# Patient Record
Sex: Male | Born: 1990 | Race: White | Hispanic: No | Marital: Married | Smoking: Current some day smoker
Health system: Southern US, Community
[De-identification: ages and names within clinical notes are randomized; demographics above are authoritative.]

## PROBLEM LIST (undated history)

## (undated) ENCOUNTER — Emergency Department (HOSPITAL_COMMUNITY): Admission: EM | Payer: Medicaid Other | Source: Home / Self Care

## (undated) DIAGNOSIS — F909 Attention-deficit hyperactivity disorder, unspecified type: Secondary | ICD-10-CM

## (undated) DIAGNOSIS — K869 Disease of pancreas, unspecified: Secondary | ICD-10-CM

## (undated) DIAGNOSIS — K859 Acute pancreatitis without necrosis or infection, unspecified: Secondary | ICD-10-CM

## (undated) DIAGNOSIS — K219 Gastro-esophageal reflux disease without esophagitis: Secondary | ICD-10-CM

## (undated) DIAGNOSIS — I10 Essential (primary) hypertension: Secondary | ICD-10-CM

## (undated) HISTORY — DX: Gastro-esophageal reflux disease without esophagitis: K21.9

## (undated) HISTORY — PX: ANTERIOR CRUCIATE LIGAMENT REPAIR: SHX115

## (undated) HISTORY — DX: Disease of pancreas, unspecified: K86.9

## (undated) HISTORY — DX: Attention-deficit hyperactivity disorder, unspecified type: F90.9

## (undated) HISTORY — PX: HERNIA REPAIR: SHX51

---
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
## 2009-01-09 ENCOUNTER — Emergency Department (HOSPITAL_COMMUNITY): Admission: EM | Admit: 2009-01-09 | Discharge: 2009-01-09 | Payer: Self-pay | Admitting: Emergency Medicine

## 2009-03-11 ENCOUNTER — Emergency Department (HOSPITAL_COMMUNITY): Admission: EM | Admit: 2009-03-11 | Discharge: 2009-03-11 | Payer: Self-pay | Admitting: Emergency Medicine

## 2009-03-16 ENCOUNTER — Ambulatory Visit (HOSPITAL_COMMUNITY): Payer: Self-pay | Admitting: Psychiatry

## 2009-05-11 ENCOUNTER — Ambulatory Visit (HOSPITAL_COMMUNITY): Payer: Self-pay | Admitting: Psychiatry

## 2009-07-06 ENCOUNTER — Ambulatory Visit (HOSPITAL_COMMUNITY): Payer: Self-pay | Admitting: Psychiatry

## 2009-09-07 ENCOUNTER — Ambulatory Visit (HOSPITAL_COMMUNITY): Payer: Self-pay | Admitting: Psychiatry

## 2009-10-10 ENCOUNTER — Emergency Department (HOSPITAL_COMMUNITY): Admission: EM | Admit: 2009-10-10 | Discharge: 2009-10-10 | Payer: Self-pay | Admitting: Emergency Medicine

## 2009-10-11 ENCOUNTER — Emergency Department (HOSPITAL_COMMUNITY): Admission: EM | Admit: 2009-10-11 | Discharge: 2009-10-11 | Payer: Self-pay | Admitting: Emergency Medicine

## 2009-11-30 ENCOUNTER — Ambulatory Visit (HOSPITAL_COMMUNITY): Payer: Self-pay | Admitting: Psychiatry

## 2010-02-22 ENCOUNTER — Ambulatory Visit (HOSPITAL_COMMUNITY): Payer: Self-pay | Admitting: Psychiatry

## 2010-05-31 ENCOUNTER — Ambulatory Visit (HOSPITAL_COMMUNITY): Payer: Self-pay | Admitting: Psychiatry

## 2010-07-03 ENCOUNTER — Emergency Department (HOSPITAL_COMMUNITY): Admission: EM | Admit: 2010-07-03 | Discharge: 2010-07-03 | Payer: Self-pay | Admitting: Emergency Medicine

## 2010-08-30 ENCOUNTER — Ambulatory Visit (HOSPITAL_COMMUNITY): Payer: Self-pay | Admitting: Psychiatry

## 2010-09-12 ENCOUNTER — Emergency Department (HOSPITAL_COMMUNITY): Admission: EM | Admit: 2010-09-12 | Discharge: 2010-09-12 | Payer: Self-pay | Admitting: Emergency Medicine

## 2010-11-29 ENCOUNTER — Encounter (INDEPENDENT_AMBULATORY_CARE_PROVIDER_SITE_OTHER): Payer: Medicaid Other | Admitting: Psychiatry

## 2010-11-29 DIAGNOSIS — F909 Attention-deficit hyperactivity disorder, unspecified type: Secondary | ICD-10-CM

## 2010-11-29 DIAGNOSIS — F6381 Intermittent explosive disorder: Secondary | ICD-10-CM

## 2011-01-15 LAB — DIFFERENTIAL
Basophils Absolute: 0 10*3/uL (ref 0.0–0.1)
Eosinophils Absolute: 0 10*3/uL (ref 0.0–0.7)
Eosinophils Relative: 0 % (ref 0–5)
Lymphs Abs: 1.3 10*3/uL (ref 0.7–4.0)
Monocytes Absolute: 1.1 10*3/uL — ABNORMAL HIGH (ref 0.1–1.0)

## 2011-01-15 LAB — URINALYSIS, ROUTINE W REFLEX MICROSCOPIC
Bilirubin Urine: NEGATIVE
Glucose, UA: NEGATIVE mg/dL
Ketones, ur: NEGATIVE mg/dL
Leukocytes, UA: NEGATIVE
Protein, ur: NEGATIVE mg/dL

## 2011-01-15 LAB — CBC
HCT: 43.3 % (ref 39.0–52.0)
Hemoglobin: 14.6 g/dL (ref 13.0–17.0)
MCV: 91.5 fL (ref 78.0–100.0)
Platelets: 161 10*3/uL (ref 150–400)
RDW: 13 % (ref 11.5–15.5)

## 2011-01-15 LAB — BASIC METABOLIC PANEL
BUN: 7 mg/dL (ref 6–23)
Chloride: 100 mEq/L (ref 96–112)
Glucose, Bld: 121 mg/dL — ABNORMAL HIGH (ref 70–99)
Potassium: 3.7 mEq/L (ref 3.5–5.1)

## 2011-01-23 LAB — BASIC METABOLIC PANEL
CO2: 27 mEq/L (ref 19–32)
Chloride: 100 mEq/L (ref 96–112)
Creatinine, Ser: 0.95 mg/dL (ref 0.4–1.5)
GFR calc Af Amer: >60 mL/min (ref >60)
Potassium: 3.7 mEq/L (ref 3.5–5.1)
Sodium: 135 mEq/L (ref 135–145)

## 2011-01-23 LAB — DIFFERENTIAL
Basophils Relative: 0 % (ref 0–1)
Eosinophils Absolute: 0 10*3/uL (ref 0.0–0.7)
Eosinophils Relative: 0 % (ref 0–5)
Lymphs Abs: 1.5 10*3/uL (ref 0.7–4.0)
Monocytes Absolute: 1.3 10*3/uL — ABNORMAL HIGH (ref 0.1–1.0)
Monocytes Relative: 11 % (ref 3–12)

## 2011-01-23 LAB — URINALYSIS, ROUTINE W REFLEX MICROSCOPIC
Glucose, UA: NEGATIVE mg/dL
Hgb urine dipstick: NEGATIVE
Protein, ur: NEGATIVE mg/dL
pH: 6 (ref 5.0–8.0)

## 2011-01-23 LAB — CBC
HCT: 42.4 % (ref 39.0–52.0)
Hemoglobin: 15.1 g/dL (ref 13.0–17.0)
MCHC: 35.6 g/dL (ref 30.0–36.0)
MCV: 89.2 fL (ref 78.0–100.0)
RBC: 4.75 MIL/uL (ref 4.22–5.81)
WBC: 11.1 10*3/uL — ABNORMAL HIGH (ref 4.0–10.5)

## 2011-01-25 LAB — BASIC METABOLIC PANEL
BUN: 8 mg/dL (ref 6–23)
Chloride: 102 mEq/L (ref 96–112)
GFR calc non Af Amer: >60 mL/min (ref >60)
Glucose, Bld: 98 mg/dL (ref 70–99)
Potassium: 3.7 mEq/L (ref 3.5–5.1)
Sodium: 134 mEq/L — ABNORMAL LOW (ref 135–145)

## 2011-01-25 LAB — URINALYSIS, ROUTINE W REFLEX MICROSCOPIC
Bilirubin Urine: NEGATIVE
Glucose, UA: NEGATIVE mg/dL
Protein, ur: NEGATIVE mg/dL
Urobilinogen, UA: 1 mg/dL (ref 0.0–1.0)

## 2011-01-25 LAB — DIFFERENTIAL
Eosinophils Absolute: 0 10*3/uL (ref 0.0–0.7)
Eosinophils Relative: 0 % (ref 0–5)
Lymphocytes Relative: 17 % (ref 12–46)
Lymphs Abs: 1.3 10*3/uL (ref 0.7–4.0)
Monocytes Absolute: 1 10*3/uL (ref 0.1–1.0)

## 2011-01-25 LAB — URINE MICROSCOPIC-ADD ON

## 2011-01-25 LAB — CBC
HCT: 40.8 % (ref 39.0–52.0)
Hemoglobin: 14.1 g/dL (ref 13.0–17.0)
MCV: 88.5 fL (ref 78.0–100.0)
WBC: 7.5 10*3/uL (ref 4.0–10.5)

## 2011-02-28 ENCOUNTER — Encounter (HOSPITAL_COMMUNITY): Payer: Medicaid Other | Admitting: Psychiatry

## 2011-03-07 ENCOUNTER — Encounter (INDEPENDENT_AMBULATORY_CARE_PROVIDER_SITE_OTHER): Payer: Medicaid Other | Admitting: Psychiatry

## 2011-03-07 DIAGNOSIS — F6381 Intermittent explosive disorder: Secondary | ICD-10-CM

## 2011-03-07 DIAGNOSIS — F909 Attention-deficit hyperactivity disorder, unspecified type: Secondary | ICD-10-CM

## 2011-04-24 ENCOUNTER — Encounter: Payer: Self-pay | Admitting: *Deleted

## 2011-04-24 ENCOUNTER — Emergency Department (HOSPITAL_COMMUNITY)
Admission: EM | Admit: 2011-04-24 | Discharge: 2011-04-24 | Disposition: A | Discharge status: Home / Self Care | Payer: Medicaid Other | Attending: Emergency Medicine | Admitting: Emergency Medicine

## 2011-04-24 ENCOUNTER — Emergency Department (HOSPITAL_COMMUNITY): Payer: Medicaid Other

## 2011-04-24 DIAGNOSIS — S60229A Contusion of unspecified hand, initial encounter: Secondary | ICD-10-CM | POA: Insufficient documentation

## 2011-04-24 DIAGNOSIS — W1801XA Striking against sports equipment with subsequent fall, initial encounter: Secondary | ICD-10-CM | POA: Insufficient documentation

## 2011-04-24 MED ORDER — HYDROCODONE-ACETAMINOPHEN 5-325 MG PO TABS: ORAL_TABLET | ORAL | Status: AC

## 2011-04-24 MED ORDER — IBUPROFEN 800 MG PO TABS: 800.0000 mg | ORAL_TABLET | Freq: Three times a day (TID) | ORAL | Status: AC

## 2011-04-24 NOTE — ED Notes (Signed)
Pt states he was playing basketball Sat. Palm of hand hit against pole, no deformity noted, skin warm and dry, good digit movement, cap refills brisk, pt states he had some swelling in his palm but none present on assessment, waiting for x-ray results

## 2011-04-24 NOTE — ED Provider Notes (Signed)
History     Chief Complaint  Patient presents with  . Hand Pain    c/o right hand pain s/p fall 2 days ago   HPI Comments: Patient c/o pain to his right hand that began during the weekend while playing basketball and he struck his hand on the basketball goal.  He denies numbness of weakness of the hand.  Also denies other injuries  Patient is a 20 y.o. male presenting with hand pain. The history is provided by the patient.  Hand Pain This is a new problem. The current episode started in the past 7 days. The problem occurs constantly. The problem has been unchanged. Associated symptoms include arthralgias and myalgias. Pertinent negatives include no fever, joint swelling, neck pain, numbness or weakness. Exacerbated by: movement, gripping, and palpation. He has tried ice and heat for the symptoms. The treatment provided no relief.    History reviewed. No pertinent past medical history.  Past Surgical History  Procedure Date  . Hernia repair   . Replacement total knee     History reviewed. No pertinent family history.  History  Substance Use Topics  . Smoking status: Never Smoker   . Smokeless tobacco: Not on file  . Alcohol Use: No      Review of Systems  Constitutional: Negative for fever.  HENT: Negative for neck pain and neck stiffness.   Respiratory: Negative for chest tightness and shortness of breath.   Cardiovascular: Negative.   Genitourinary: Negative for dysuria and hematuria.  Musculoskeletal: Positive for myalgias and arthralgias. Negative for joint swelling.  Skin: Negative for color change and wound.  Neurological: Negative for weakness and numbness.  Hematological: Does not bruise/bleed easily.    Physical Exam  BP 103/77  Pulse 84  Temp(Src) 98.2 F (36.8 C) (Oral)  Resp 20  Ht 5\' 8"  (1.727 m)  Wt 168 lb (76.204 kg)  BMI 25.54 kg/m2  SpO2 99%  Physical Exam  Constitutional: He is oriented to person, place, and time. Vital signs are normal. He  appears well-developed and well-nourished.  Non-toxic appearance.  HENT:  Head: Normocephalic and atraumatic.  Eyes: Pupils are equal, round, and reactive to light.  Neck: Normal range of motion. Neck supple.  Cardiovascular: Normal rate, regular rhythm and normal heart sounds.   Pulmonary/Chest: Effort normal and breath sounds normal.  Musculoskeletal:       Right wrist: He exhibits tenderness. He exhibits no swelling, no crepitus and no deformity.       Arms: Lymphadenopathy:    He has no cervical adenopathy.  Neurological: He is alert and oriented to person, place, and time.  Skin: Skin is dry.  Psychiatric: He has a normal mood and affect.    ED Course  Procedures  MDM   ttp of the right palmar aspect of the right hand and thenar eminence.  No edema or erythema.  No weakness or numbness.  Radial pulse is intact, CR< sec, distal sensation intact      Eduin Friedel L. [place], [place] 04/24/11 1656

## 2011-04-24 NOTE — ED Notes (Signed)
C/o right hand pain s/p fall-states hit on metal base of basketball goal

## 2011-04-25 NOTE — ED Provider Notes (Signed)
Medical screening examination/treatment/procedure(s) were performed by non-physician practitioner and as supervising physician I was immediately available for consultation/collaboration.  Joya Gaskins, MD 04/25/11 [phone]

## 2011-05-09 ENCOUNTER — Encounter (INDEPENDENT_AMBULATORY_CARE_PROVIDER_SITE_OTHER): Payer: Medicaid Other | Admitting: Psychiatry

## 2011-05-09 DIAGNOSIS — F6381 Intermittent explosive disorder: Secondary | ICD-10-CM

## 2011-05-09 DIAGNOSIS — F909 Attention-deficit hyperactivity disorder, unspecified type: Secondary | ICD-10-CM

## 2011-05-30 ENCOUNTER — Emergency Department (HOSPITAL_COMMUNITY)
Admission: EM | Admit: 2011-05-30 | Discharge: 2011-05-30 | Disposition: A | Discharge status: Home / Self Care | Payer: Medicaid Other | Attending: Emergency Medicine | Admitting: Emergency Medicine

## 2011-05-30 ENCOUNTER — Encounter (HOSPITAL_COMMUNITY): Payer: Self-pay

## 2011-05-30 DIAGNOSIS — M545 Low back pain, unspecified: Secondary | ICD-10-CM | POA: Insufficient documentation

## 2011-05-30 DIAGNOSIS — R1013 Epigastric pain: Secondary | ICD-10-CM | POA: Insufficient documentation

## 2011-05-30 DIAGNOSIS — M549 Dorsalgia, unspecified: Secondary | ICD-10-CM

## 2011-05-30 LAB — COMPREHENSIVE METABOLIC PANEL
ALT: 35 U/L (ref 0–53)
AST: 24 U/L (ref 0–37)
Albumin: 4 g/dL (ref 3.5–5.2)
Alkaline Phosphatase: 115 U/L (ref 39–117)
BUN: 9 mg/dL (ref 6–23)
CO2: 24 mEq/L (ref 19–32)
Calcium: 9.9 mg/dL (ref 8.4–10.5)
Chloride: 98 mEq/L (ref 96–112)
Creatinine, Ser: 0.72 mg/dL (ref 0.50–1.35)
GFR calc Af Amer: >60 mL/min (ref >60)
GFR calc non Af Amer: >60 mL/min (ref >60)
Glucose, Bld: 98 mg/dL (ref 70–99)
Potassium: 4 mEq/L (ref 3.5–5.1)
Sodium: 135 mEq/L (ref 135–145)
Total Bilirubin: 0.2 mg/dL — ABNORMAL LOW (ref 0.3–1.2)
Total Protein: 7.7 g/dL (ref 6.0–8.3)

## 2011-05-30 LAB — DIFFERENTIAL
Basophils Absolute: 0 10*3/uL (ref 0.0–0.1)
Basophils Relative: 0 % (ref 0–1)
Eosinophils Absolute: 0.2 10*3/uL (ref 0.0–0.7)
Eosinophils Relative: 2 % (ref 0–5)
Monocytes Absolute: 1 10*3/uL (ref 0.1–1.0)

## 2011-05-30 LAB — CBC
HCT: 42.4 % (ref 39.0–52.0)
MCH: 31.3 pg (ref 26.0–34.0)
MCHC: 34.7 g/dL (ref 30.0–36.0)
MCV: 90.4 fL (ref 78.0–100.0)
Platelets: 215 10*3/uL (ref 150–400)
RDW: 12.9 % (ref 11.5–15.5)

## 2011-05-30 LAB — URINALYSIS, ROUTINE W REFLEX MICROSCOPIC
Glucose, UA: NEGATIVE mg/dL
Ketones, ur: NEGATIVE mg/dL
Leukocytes, UA: NEGATIVE
Nitrite: NEGATIVE
Protein, ur: NEGATIVE mg/dL
Urobilinogen, UA: 0.2 mg/dL (ref 0.0–1.0)

## 2011-05-30 LAB — LIPASE, BLOOD: Lipase: 18 U/L (ref 11–59)

## 2011-05-30 MED ORDER — OXYCODONE-ACETAMINOPHEN 5-325 MG PO TABS: 1.0000 | ORAL_TABLET | Freq: Four times a day (QID) | ORAL | Status: AC | PRN

## 2011-05-30 MED ORDER — ONDANSETRON HCL 4 MG/2ML IJ SOLN
4.0000 mg | Freq: Once | INTRAMUSCULAR | Status: AC
  Administered 2011-05-30: 4 mg via INTRAVENOUS
  Filled 2011-05-30: qty 2

## 2011-05-30 MED ORDER — CYCLOBENZAPRINE HCL 10 MG PO TABS: 10.0000 mg | ORAL_TABLET | Freq: Two times a day (BID) | ORAL | Status: AC | PRN

## 2011-05-30 MED ORDER — SODIUM CHLORIDE 0.9 % IV SOLN: INTRAVENOUS | Status: DC

## 2011-05-30 MED ORDER — HYDROMORPHONE HCL 1 MG/ML IJ SOLN
1.0000 mg | Freq: Once | INTRAMUSCULAR | Status: AC
  Administered 2011-05-30: 1 mg via INTRAVENOUS
  Filled 2011-05-30: qty 1

## 2011-05-30 MED ORDER — LANSOPRAZOLE 30 MG PO CPDR: 30.0000 mg | DELAYED_RELEASE_CAPSULE | Freq: Every day | ORAL | Status: DC

## 2011-05-30 MED ORDER — GI COCKTAIL ~~LOC~~
30.0000 mL | Freq: Once | ORAL | Status: AC
  Administered 2011-05-30: 30 mL via ORAL
  Filled 2011-05-30: qty 30

## 2011-05-30 MED ORDER — SODIUM CHLORIDE 0.9 % IV BOLUS (SEPSIS)
1000.0000 mL | Freq: Once | INTRAVENOUS | Status: AC
  Administered 2011-05-30: 1000 mL via INTRAVENOUS

## 2011-05-30 NOTE — ED Notes (Signed)
Pt c/o lower back pain x 2 days also reports pain in epigastric area.  Has been taking tums without relief.   Epigastric area sore to touch.  Denies n/v.  Denies chest pain

## 2011-05-30 NOTE — ED Provider Notes (Signed)
History     CSN: [id number] Arrival date & time: 05/30/2011  7:32 PM  Chief Complaint  Patient presents with  . Abdominal Pain  . Back Pain   HPI Pt reports several days of epigastric aching pain, radiating into chest, not improved with TUMS, no associated vomiting diarrhea or fever. He also now has pain in his lower back worse with movement.   History reviewed. No pertinent past medical history.  Past Surgical History  Procedure Date  . Hernia repair   . Replacement total knee     History reviewed. No pertinent family history.  History  Substance Use Topics  . Smoking status: Never Smoker   . Smokeless tobacco: Not on file  . Alcohol Use: No      Review of Systems All other systems reviewed and are negative except as noted in HPI.   Physical Exam  BP 135/89  Pulse 76  Temp(Src) 98.5 F (36.9 C) (Oral)  Resp 18  Ht 5\' 8"  (1.727 m)  Wt 183 lb 3.2 oz (83.099 kg)  BMI 27.86 kg/m2  SpO2 100%  Physical Exam  Nursing note and vitals reviewed. Constitutional: He is oriented to person, place, and time. He appears well-developed and well-nourished.  HENT:  Head: Normocephalic and atraumatic.  Eyes: EOM are normal. Pupils are equal, round, and reactive to light.  Neck: Normal range of motion. Neck supple.  Cardiovascular: Normal rate, normal heart sounds and intact distal pulses.   Pulmonary/Chest: Effort normal and breath sounds normal.  Abdominal: Soft. Bowel sounds are normal. He exhibits no distension. There is tenderness (epigatric tenderness). There is no rebound and no guarding.  Musculoskeletal: Normal range of motion. He exhibits no edema and no tenderness.       Tender over the L lumbar paraspinal muscles  Neurological: He is alert and oriented to person, place, and time. No cranial nerve deficit.  Skin: Skin is warm and dry. No rash noted.  Psychiatric: He has a normal mood and affect.    ED Course  Procedures  MDM Pt feeling better, labs  unremarkable. Pt asking for something for his back. His abdomen is benign, doubt any acute surgical intraabdominal process. Back pain is lumbar and appears to be muscle spasm and unrelated to abdominal pain. Advised Maalox, PPI for abdominal pain. Short course of percocet/flexeril for back pain. Advised PCP followup.       Charles B. Bernette Mayers, MD 05/30/11 2200

## 2011-06-20 ENCOUNTER — Encounter (HOSPITAL_COMMUNITY): Payer: Medicaid Other | Admitting: Psychiatry

## 2011-06-27 ENCOUNTER — Encounter (INDEPENDENT_AMBULATORY_CARE_PROVIDER_SITE_OTHER): Payer: Medicaid Other | Admitting: Psychiatry

## 2011-06-27 DIAGNOSIS — F909 Attention-deficit hyperactivity disorder, unspecified type: Secondary | ICD-10-CM

## 2011-06-27 DIAGNOSIS — F6381 Intermittent explosive disorder: Secondary | ICD-10-CM

## 2011-07-25 ENCOUNTER — Emergency Department (HOSPITAL_COMMUNITY)
Admission: EM | Admit: 2011-07-25 | Discharge: 2011-07-25 | Disposition: A | Discharge status: Home / Self Care | Payer: Medicaid Other | Attending: Emergency Medicine | Admitting: Emergency Medicine

## 2011-07-25 ENCOUNTER — Encounter (HOSPITAL_COMMUNITY): Payer: Self-pay | Admitting: *Deleted

## 2011-07-25 ENCOUNTER — Emergency Department (HOSPITAL_COMMUNITY): Payer: Medicaid Other

## 2011-07-25 DIAGNOSIS — W11XXXA Fall on and from ladder, initial encounter: Secondary | ICD-10-CM | POA: Insufficient documentation

## 2011-07-25 DIAGNOSIS — M62838 Other muscle spasm: Secondary | ICD-10-CM | POA: Insufficient documentation

## 2011-07-25 DIAGNOSIS — M25562 Pain in left knee: Secondary | ICD-10-CM

## 2011-07-25 DIAGNOSIS — W19XXXA Unspecified fall, initial encounter: Secondary | ICD-10-CM

## 2011-07-25 DIAGNOSIS — M25569 Pain in unspecified knee: Secondary | ICD-10-CM | POA: Insufficient documentation

## 2011-07-25 MED ORDER — CYCLOBENZAPRINE HCL 10 MG PO TABS
10.0000 mg | ORAL_TABLET | Freq: Once | ORAL | Status: AC
  Administered 2011-07-25: 10 mg via ORAL
  Filled 2011-07-25: qty 1

## 2011-07-25 MED ORDER — IBUPROFEN 100 MG/5ML PO SUSP
600.0000 mg | Freq: Once | ORAL | Status: DC
  Filled 2011-07-25: qty 30

## 2011-07-25 MED ORDER — CYCLOBENZAPRINE HCL 5 MG PO TABS: 5.0000 mg | ORAL_TABLET | Freq: Three times a day (TID) | ORAL | Status: AC | PRN

## 2011-07-25 MED ORDER — IBUPROFEN 800 MG PO TABS
ORAL_TABLET | ORAL | Status: AC
  Administered 2011-07-25: 800 mg
  Filled 2011-07-25: qty 1

## 2011-07-25 MED ORDER — TRAMADOL-ACETAMINOPHEN 37.5-325 MG PO TABS: ORAL_TABLET | ORAL | Status: AC

## 2011-07-25 NOTE — ED Provider Notes (Cosign Needed)
Scribed for Ward Givens, MD, the patient was seen in room APA11/APA11. This chart was scribed by [employer]. The patient's care started at 20:26  CSN: [id number] Arrival date & time: 07/25/2011  8:26 PM  Chief Complaint  Patient presents with  . Knee Pain   HPI Joseph Blair is a 20 y.o. male with a history of left ACl reconstruction who presents to the Emergency Department complaining of knee pain since 4pm today. Patient was 18ft up a ladder putting up Halloween decorations when he fell, falling on his left side. Patient denies hitting his head. Reports cramps on his right side of his body with pain in his right anterior neck, his muscles in his right chest and leg. Describes knee pain as a "burning" sensation. Pain is aggravated by weight bearing. Reports having ACL reconstruction in 2009 and has had chronic problems since.   History reviewed. No pertinent past medical history.  Past Surgical History  Procedure Date  . Hernia repair       ACL repair of left knee  History reviewed. No pertinent family history.  History  Substance Use Topics  . Smoking status: Never Smoker   . Smokeless tobacco: Not on file  . Alcohol Use: No      Review of Systems  All other systems reviewed and are negative.    Allergies  Bee venom  Home Medications   Current Outpatient Rx  Name Route Sig Dispense Refill  . AMPHETAMINE-DEXTROAMPHETAMINE 30 MG PO TABS Oral Take 30 mg by mouth every morning.     Marland Kitchen LANSOPRAZOLE 30 MG PO CPDR Oral Take 1 capsule (30 mg total) by mouth daily. 20 capsule 0    BP 112/95  Pulse 64  Temp(Src) 99 F (37.2 C) (Oral)  Resp 16  Ht 5\' 8"  (1.727 m)  Wt 183 lb (83.008 kg)  BMI 27.82 kg/m2  SpO2 98%  Physical Exam  Nursing note and vitals reviewed. Constitutional: He is oriented to person, place, and time. He appears well-developed and well-nourished. No distress.  HENT:  Head: Normocephalic and atraumatic.  Right Ear: External ear normal.    Left Ear: External ear normal.  Nose: Nose normal.  Mouth/Throat: Oropharynx is clear and moist. No oropharyngeal exudate.  Eyes: Conjunctivae are normal. Pupils are equal, round, and reactive to light. Right eye exhibits no discharge. Left eye exhibits no discharge.  Neck: Normal range of motion. Neck supple. No tracheal deviation present.       Right sternocleidomastoid muscle tenderness, nontender midline cervical spine or paraspinous muscle.   Cardiovascular: Normal rate and normal heart sounds.   No murmur heard. Pulmonary/Chest: Effort normal and breath sounds normal. No respiratory distress. He has no wheezes.       Nontender ribs to palpation  Abdominal: Soft. Bowel sounds are normal. There is no tenderness. There is no rebound.  Musculoskeletal: Normal range of motion. He exhibits no edema.       Diffuse muscle tenderness in the legs on the right. He has no effusion in his left knee and states the whole knee is tender to palpation. No abrasions, swelling or bruising.  Neurological: He is oriented to person, place, and time. No cranial nerve deficit.  Skin: Skin is warm and dry. No rash noted. He is not diaphoretic. No erythema.       No brusing or abrasions seen  Psychiatric: He has a normal mood and affect. His behavior is normal.    ED Course  Procedures  DIAGNOSTIC STUDIES: Oxygen Saturation is 98% on room air, normal by my interpretation.    Dg Knee Complete 4 Views Left  07/25/2011  *RADIOLOGY REPORT*  Clinical Data: Left knee pain status post fall  LEFT KNEE - COMPLETE 4+ VIEW  Comparison: 07/03/2010  Findings: Postsurgical changes in keeping with prior ACL reconstruction.  No radiographic evidence for hardware complication.  There may be a small knee joint effusion.  No acute fracture or dislocation identified.  IMPRESSION: Post ACL reconstruction. No acute osseous abnormality identified.  Original Report Authenticated By: Waneta Martins, M.D.    COORDINATION OF  CARE: 21:39 - EDP examined patient. Pain management ordered. Pt requested crutches and knee immobilizer. He is also interested in orthopedic referral for his chronic knee pain.    Medications  ibuprofen (ADVIL,MOTRIN) 100 MG/5ML suspension 600 mg (not administered)  traMADol-acetaminophen (ULTRACET) 37.5-325 MG per tablet (not administered)  cyclobenzaprine (FLEXERIL) 5 MG tablet (not administered)  cyclobenzaprine (FLEXERIL) tablet 10 mg (10 mg Oral Given 07/25/11 2145)  ibuprofen (ADVIL,MOTRIN) 800 MG tablet (800 mg  Given 07/25/11 2149)   I personally performed the services described in this documentation, which was scribed in my presence. The recorded information has been reviewed and considered. Joseph Albe, MD, Armando Gang   Ward Givens, MD 07/25/11 2226

## 2011-07-25 NOTE — ED Notes (Signed)
C/o left knee pain since fall today off a ladder, has had left knee total knee replacement in 2009, pain off and on since surgery

## 2011-08-08 ENCOUNTER — Encounter (HOSPITAL_COMMUNITY): Payer: Medicaid Other | Admitting: Psychiatry

## 2011-08-08 ENCOUNTER — Encounter (INDEPENDENT_AMBULATORY_CARE_PROVIDER_SITE_OTHER): Payer: Medicaid Other | Admitting: Psychiatry

## 2011-08-08 DIAGNOSIS — F6381 Intermittent explosive disorder: Secondary | ICD-10-CM

## 2011-08-08 DIAGNOSIS — F909 Attention-deficit hyperactivity disorder, unspecified type: Secondary | ICD-10-CM

## 2011-09-08 ENCOUNTER — Other Ambulatory Visit (HOSPITAL_COMMUNITY): Payer: Self-pay | Admitting: Psychiatry

## 2011-09-12 ENCOUNTER — Other Ambulatory Visit (HOSPITAL_COMMUNITY): Payer: Self-pay | Admitting: *Deleted

## 2011-09-12 DIAGNOSIS — F902 Attention-deficit hyperactivity disorder, combined type: Secondary | ICD-10-CM

## 2011-09-12 MED ORDER — AMPHETAMINE-DEXTROAMPHETAMINE 30 MG PO TABS: 30.0000 mg | ORAL_TABLET | ORAL | Status: DC

## 2011-09-12 NOTE — Telephone Encounter (Signed)
Refill done.  

## 2011-10-03 ENCOUNTER — Ambulatory Visit (INDEPENDENT_AMBULATORY_CARE_PROVIDER_SITE_OTHER): Payer: Medicaid Other | Admitting: Psychiatry

## 2011-10-03 ENCOUNTER — Encounter (HOSPITAL_COMMUNITY): Payer: Medicaid Other | Admitting: Psychiatry

## 2011-10-03 ENCOUNTER — Encounter (HOSPITAL_COMMUNITY): Payer: Self-pay | Admitting: Psychiatry

## 2011-10-03 DIAGNOSIS — F909 Attention-deficit hyperactivity disorder, unspecified type: Secondary | ICD-10-CM

## 2011-10-03 DIAGNOSIS — F6381 Intermittent explosive disorder: Secondary | ICD-10-CM | POA: Insufficient documentation

## 2011-10-03 DIAGNOSIS — F902 Attention-deficit hyperactivity disorder, combined type: Secondary | ICD-10-CM | POA: Insufficient documentation

## 2011-10-03 MED ORDER — AMPHETAMINE-DEXTROAMPHET ER 30 MG PO CP24: 30.0000 mg | ORAL_CAPSULE | ORAL | Status: DC

## 2011-10-03 MED ORDER — AMPHETAMINE-DEXTROAMPHET ER 30 MG PO CP24: 30.0000 mg | ORAL_CAPSULE | Freq: Every day | ORAL | Status: DC

## 2011-10-03 NOTE — Progress Notes (Signed)
   [facility] Follow-up Outpatient Visit  Joseph Blair 09-19-1991  Date:    Subjective: I am doing well, I have enrolled for college and will start at [employer] next month. 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 There no side effects, no safety issues.  Filed Vitals:   10/03/11 0940  BP: 120/78    Mental Status Examination  Appearance: Casually dressed Alert: Yes Attention: fair  Cooperative: Yes Eye Contact: Good Speech: Normal in volume, rate, tone, spontaneous  Psychomotor Activity: Normal Memory/Concentration: OK Oriented: person, place and situation Mood: Euthymic Affect: Full Range Thought Processes and Associations: Goal Directed Fund of Knowledge: Fair Thought Content: Suicidal ideation, Homicidal ideation, Auditory hallucinations, Visual hallucinations, Delusions and Paranoia-none reported Insight: Good Judgement: Good  Diagnosis: ADHD combined type, moderate severity, intermittent explosive disorder  Treatment Plan: Continue Adderall XR 30 mg one in the morning. Continue Prevacid which is prescribed for patient's reflux by his primary care physician Call when necessary Followup in 2-3 months  Nelly Rout, MD

## 2011-10-12 ENCOUNTER — Emergency Department (HOSPITAL_COMMUNITY): Payer: Medicaid Other

## 2011-10-12 ENCOUNTER — Emergency Department (HOSPITAL_COMMUNITY)
Admission: EM | Admit: 2011-10-12 | Discharge: 2011-10-12 | Disposition: A | Discharge status: Home / Self Care | Payer: Medicaid Other | Attending: Emergency Medicine | Admitting: Emergency Medicine

## 2011-10-12 ENCOUNTER — Encounter (HOSPITAL_COMMUNITY): Payer: Self-pay

## 2011-10-12 DIAGNOSIS — R05 Cough: Secondary | ICD-10-CM | POA: Insufficient documentation

## 2011-10-12 DIAGNOSIS — R059 Cough, unspecified: Secondary | ICD-10-CM | POA: Insufficient documentation

## 2011-10-12 DIAGNOSIS — B349 Viral infection, unspecified: Secondary | ICD-10-CM

## 2011-10-12 DIAGNOSIS — F909 Attention-deficit hyperactivity disorder, unspecified type: Secondary | ICD-10-CM | POA: Insufficient documentation

## 2011-10-12 DIAGNOSIS — B9789 Other viral agents as the cause of diseases classified elsewhere: Secondary | ICD-10-CM | POA: Insufficient documentation

## 2011-10-12 DIAGNOSIS — K219 Gastro-esophageal reflux disease without esophagitis: Secondary | ICD-10-CM | POA: Insufficient documentation

## 2011-10-12 MED ORDER — ALBUTEROL SULFATE (5 MG/ML) 0.5% IN NEBU
2.5000 mg | INHALATION_SOLUTION | Freq: Once | RESPIRATORY_TRACT | Status: AC
  Administered 2011-10-12: 2.5 mg via RESPIRATORY_TRACT
  Filled 2011-10-12: qty 0.5

## 2011-10-12 MED ORDER — OSELTAMIVIR PHOSPHATE 75 MG PO CAPS: 75.0000 mg | ORAL_CAPSULE | Freq: Two times a day (BID) | ORAL | Status: AC

## 2011-10-12 MED ORDER — IPRATROPIUM BROMIDE 0.02 % IN SOLN: 0.5000 mg | RESPIRATORY_TRACT | Status: DC

## 2011-10-12 MED ORDER — ALBUTEROL SULFATE (5 MG/ML) 0.5% IN NEBU: 2.5000 mg | INHALATION_SOLUTION | RESPIRATORY_TRACT | Status: DC

## 2011-10-12 MED ORDER — IPRATROPIUM BROMIDE 0.02 % IN SOLN
0.5000 mg | Freq: Once | RESPIRATORY_TRACT | Status: AC
  Administered 2011-10-12: 0.5 mg via RESPIRATORY_TRACT
  Filled 2011-10-12: qty 2.5

## 2011-10-12 NOTE — ED Provider Notes (Signed)
History   This chart was scribed for Donnetta Hutching, MD by Clarita Crane. The patient was seen in room APA17/APA17 and the patient's care was started at 7:45AM.   CSN: [id number]  Arrival date & time 10/12/11  4098   First MD Initiated Contact with Patient 10/12/11 [phone]      Chief Complaint  Patient presents with  . Fever  . Generalized Body Aches    (Consider location/radiation/quality/duration/timing/severity/associated sxs/prior treatment) HPI Joseph Blair is a 20 y.o. male who presents to the Emergency Department complaining of constant moderate to severe flu like symptoms which include fever, HA, cough, nausea, vomiting, diarrhea and chest soreness with coughing onset yesterday and persistent since. Patient notes fever was measured at 102.8 at its highest. Denies decreased fluid intake, decreased appetite.  Past Medical History  Diagnosis Date  . ADHD (attention deficit hyperactivity disorder)   . GERD (gastroesophageal reflux disease)     Past Surgical History  Procedure Date  . Hernia repair   . Replacement total knee     Family History  Problem Relation Age of Onset  . ADD / ADHD Brother   . ADD / ADHD Maternal Aunt   . Drug abuse Maternal Uncle   . Alcohol abuse Maternal Uncle   . Schizophrenia Maternal Uncle     History  Substance Use Topics  . Smoking status: Never Smoker   . Smokeless tobacco: Not on file  . Alcohol Use: No      Review of Systems 10 Systems reviewed and are negative for acute change except as noted in the HPI.  Allergies  Bee venom  Home Medications   Current Outpatient Rx  Name Route Sig Dispense Refill  . AMPHETAMINE-DEXTROAMPHET ER 30 MG PO CP24 Oral Take 1 capsule (30 mg total) by mouth daily. 30 capsule 0  . LANSOPRAZOLE 30 MG PO CPDR Oral Take 1 capsule (30 mg total) by mouth daily. 20 capsule 0  . AMPHETAMINE-DEXTROAMPHET ER 30 MG PO CP24 Oral Take 1 capsule (30 mg total) by mouth every morning. 30 capsule 0    Do  not 11/04/11    BP 146/79  Pulse 95  Temp(Src) 98.9 F (37.2 C) (Oral)  Resp 20  Ht 5' 6.5" (1.689 m)  Wt 180 lb (81.647 kg)  BMI 28.62 kg/m2  SpO2 99%  Physical Exam  Nursing note and vitals reviewed. Constitutional: He is oriented to person, place, and time. He appears well-developed and well-nourished. No distress.  HENT:  Head: Normocephalic and atraumatic.  Mouth/Throat: Oropharynx is clear and moist.       Rhinorrhea. Oropharynx mildly erythematous. TMs normal bilaterally.   Eyes: EOM are normal. Pupils are equal, round, and reactive to light.  Neck: Neck supple. No tracheal deviation present.  Cardiovascular: Normal rate and regular rhythm.   Pulmonary/Chest: Effort normal and breath sounds normal. No respiratory distress. He has no wheezes.  Abdominal: Soft. He exhibits no distension.  Musculoskeletal: Normal range of motion. He exhibits no edema.  Neurological: He is alert and oriented to person, place, and time. No sensory deficit.  Skin: Skin is warm and dry.  Psychiatric: He has a normal mood and affect. His behavior is normal.    ED Course  Procedures (including critical care time)  DIAGNOSTIC STUDIES: Oxygen Saturation is 99% on room air, normal by my interpretation.    COORDINATION OF CARE: 7:50AM- Patient informed of current clinical impression and intent to d/c home. Also informed of reasons to return to ED for  care. Patient agrees with current plan.   Labs Reviewed - No data to display Dg Chest 2 View  10/12/2011  *RADIOLOGY REPORT*  Clinical Data: Cough, headache and fever.  CHEST - 2 VIEW  Comparison: Chest radiograph performed 10/10/2009  Findings: The lungs are well-aerated and clear.  There is no evidence of focal opacification, pleural effusion or pneumothorax. An asymmetric left-sided nipple shadow is noted, stable from 2010.  The heart is normal in size; the mediastinal contour is within normal limits.  No acute osseous abnormalities are seen.   IMPRESSION: No acute cardiopulmonary process seen.  Original Report Authenticated By: Tonia Ghent, M.D.     No diagnosis found.    MDM  History and physical most consistent with viral syndrome.  Rx Tamiflu.  Patient is non-toxic      I personally performed the services described in this documentation, which was scribed in my presence. The recorded information has been reviewed and considered.    Donnetta Hutching, MD 10/13/11 [phone]

## 2011-10-12 NOTE — ED Notes (Signed)
Fever, body aches, chest discomfort, cough, diarrhea, vomiting

## 2011-10-16 DIAGNOSIS — K869 Disease of pancreas, unspecified: Secondary | ICD-10-CM

## 2011-10-16 HISTORY — DX: Disease of pancreas, unspecified: K86.9

## 2011-11-28 ENCOUNTER — Other Ambulatory Visit (HOSPITAL_COMMUNITY): Payer: Self-pay | Admitting: *Deleted

## 2011-11-28 DIAGNOSIS — F902 Attention-deficit hyperactivity disorder, combined type: Secondary | ICD-10-CM

## 2011-11-28 MED ORDER — AMPHETAMINE-DEXTROAMPHET ER 30 MG PO CP24: 30.0000 mg | ORAL_CAPSULE | Freq: Every day | ORAL | Status: DC

## 2011-12-06 ENCOUNTER — Emergency Department (HOSPITAL_COMMUNITY): Payer: Medicaid Other

## 2011-12-06 ENCOUNTER — Encounter (HOSPITAL_COMMUNITY): Payer: Self-pay

## 2011-12-06 ENCOUNTER — Emergency Department (HOSPITAL_COMMUNITY)
Admission: EM | Admit: 2011-12-06 | Discharge: 2011-12-06 | Disposition: A | Discharge status: Home / Self Care | Payer: Medicaid Other | Attending: Emergency Medicine | Admitting: Emergency Medicine

## 2011-12-06 DIAGNOSIS — R61 Generalized hyperhidrosis: Secondary | ICD-10-CM | POA: Insufficient documentation

## 2011-12-06 DIAGNOSIS — R509 Fever, unspecified: Secondary | ICD-10-CM | POA: Insufficient documentation

## 2011-12-06 DIAGNOSIS — K219 Gastro-esophageal reflux disease without esophagitis: Secondary | ICD-10-CM | POA: Insufficient documentation

## 2011-12-06 DIAGNOSIS — J189 Pneumonia, unspecified organism: Secondary | ICD-10-CM | POA: Insufficient documentation

## 2011-12-06 DIAGNOSIS — R05 Cough: Secondary | ICD-10-CM | POA: Insufficient documentation

## 2011-12-06 DIAGNOSIS — R059 Cough, unspecified: Secondary | ICD-10-CM | POA: Insufficient documentation

## 2011-12-06 DIAGNOSIS — F909 Attention-deficit hyperactivity disorder, unspecified type: Secondary | ICD-10-CM | POA: Insufficient documentation

## 2011-12-06 DIAGNOSIS — R079 Chest pain, unspecified: Secondary | ICD-10-CM | POA: Insufficient documentation

## 2011-12-06 DIAGNOSIS — M549 Dorsalgia, unspecified: Secondary | ICD-10-CM | POA: Insufficient documentation

## 2011-12-06 DIAGNOSIS — Z79899 Other long term (current) drug therapy: Secondary | ICD-10-CM | POA: Insufficient documentation

## 2011-12-06 MED ORDER — AZITHROMYCIN 250 MG PO TABS: ORAL_TABLET | ORAL | Status: DC

## 2011-12-06 MED ORDER — ACETAMINOPHEN 500 MG PO TABS
1000.0000 mg | ORAL_TABLET | Freq: Once | ORAL | Status: AC
Start: 2011-12-06 — End: 2011-12-06
  Administered 2011-12-06: 1000 mg via ORAL
  Filled 2011-12-06: qty 2

## 2011-12-06 MED ORDER — GUAIFENESIN-CODEINE 100-10 MG/5ML PO SYRP: ORAL_SOLUTION | ORAL | Status: DC

## 2011-12-06 MED ORDER — AZITHROMYCIN 250 MG PO TABS
500.0000 mg | ORAL_TABLET | Freq: Once | ORAL | Status: AC
  Administered 2011-12-06: 500 mg via ORAL
  Filled 2011-12-06: qty 2

## 2011-12-06 NOTE — Discharge Instructions (Signed)
Mr. Bushey, you had physical examination and a chest x-ray to check on you for cough and fever.  The chest x-ray shows a small spot of pneumonia in your left lung.  Take the antibiotic azithromycin once a day to treat this infection.  You have been given the beginning dose of medicine here at the hospital.  Starting tomorrow, take one 250 mg azithromycin tablet once a day for the next 4 days, to treat this infection.  Take the cough medicine Robitussun AC, 2 teaspoons every 4 hours if needed for cough.  Measure your temperature before meals and at bedtime.  If your temperature is higher that 100.6, take either 3 adult Tylenols or 2 extra-strength Tylenols to bring it down.      Pneumonia, Adult Pneumonia is an infection of the lungs.  CAUSES Pneumonia may be caused by bacteria or a virus. Usually, these infections are caused by breathing infectious particles into the lungs (respiratory tract). SYMPTOMS   Cough.   Fever.   Chest pain.   Increased rate of breathing.   Wheezing.   Mucus production.  DIAGNOSIS  If you have the common symptoms of pneumonia, your caregiver will typically confirm the diagnosis with a chest X-ray. The X-ray will show an abnormality in the lung (pulmonary infiltrate) if you have pneumonia. Other tests of your blood, urine, or sputum may be done to find the specific cause of your pneumonia. Your caregiver may also do tests (blood gases or pulse oximetry) to see how well your lungs are working. TREATMENT  Some forms of pneumonia may be spread to other people when you cough or sneeze. You may be asked to wear a mask before and during your exam. Pneumonia that is caused by bacteria is treated with antibiotic medicine. Pneumonia that is caused by the influenza virus may be treated with an antiviral medicine. Most other viral infections must run their course. These infections will not respond to antibiotics.  PREVENTION A pneumococcal shot (vaccine) is available to  prevent a common bacterial cause of pneumonia. This is usually suggested for:  People over 41 years old.   Patients on chemotherapy.   People with chronic lung problems, such as bronchitis or emphysema.   People with immune system problems.  If you are over 65 or have a high risk condition, you may receive the pneumococcal vaccine if you have not received it before. In some countries, a routine influenza vaccine is also recommended. This vaccine can help prevent some cases of pneumonia.You may be offered the influenza vaccine as part of your care. If you smoke, it is time to quit. You may receive instructions on how to stop smoking. Your caregiver can provide medicines and counseling to help you quit. HOME CARE INSTRUCTIONS   Cough suppressants may be used if you are losing too much rest. However, coughing protects you by clearing your lungs. You should avoid using cough suppressants if you can.   Your caregiver may have prescribed medicine if he or she thinks your pneumonia is caused by a bacteria or influenza. Finish your medicine even if you start to feel better.   Your caregiver may also prescribe an expectorant. This loosens the mucus to be coughed up.   Only take over-the-counter or prescription medicines for pain, discomfort, or fever as directed by your caregiver.   Do not smoke. Smoking is a common cause of bronchitis and can contribute to pneumonia. If you are a smoker and continue to smoke, your cough may last  several weeks after your pneumonia has cleared.   A cold steam vaporizer or humidifier in your room or home may help loosen mucus.   Coughing is often worse at night. Sleeping in a semi-upright position in a recliner or using a couple pillows under your head will help with this.   Get rest as you feel it is needed. Your body will usually let you know when you need to rest.  SEEK IMMEDIATE MEDICAL CARE IF:   Your illness becomes worse. This is especially true if you are  elderly or weakened from any other disease.   You cannot control your cough with suppressants and are losing sleep.   You begin coughing up blood.   You develop pain which is getting worse or is uncontrolled with medicines.   You have a fever.   Any of the symptoms which initially brought you in for treatment are getting worse rather than better.   You develop shortness of breath or chest pain.  MAKE SURE YOU:   Understand these instructions.   Will watch your condition.   Will get help right away if you are not doing well or get worse.  Document Released: 10/01/2005 Document Revised: 06/13/2011 Document Reviewed: 12/21/2010 [facility] Patient Information 2012 [place], [place].

## 2011-12-06 NOTE — ED Notes (Signed)
Complain of fever, cough, back and rib pain

## 2011-12-06 NOTE — ED Provider Notes (Signed)
History     CSN: [id number]  Arrival date & time 12/06/11  1536   First MD Initiated Contact with Patient 12/06/11 1725      Chief Complaint  Patient presents with  . Back Pain    (Consider location/radiation/quality/duration/timing/severity/associated sxs/prior treatment) HPI Comments: Pt is a 21 year old man who has had cough and fever since yesterday.  He asy he can't take a deep breath.  He says that his temperature was 103 degrees this morning.  He has not taken any medicine for his fever or cough.  He denies smoking.  Patient is a 21 y.o. male presenting with cough.  Cough This is a new problem. The current episode started yesterday. The problem occurs every few minutes. The problem has not changed since onset.The cough is productive of sputum. The maximum temperature recorded prior to his arrival was 103 to 104 F. Associated symptoms include chest pain, chills and sweats. He has tried nothing for the symptoms. He is not a smoker.    Past Medical History  Diagnosis Date  . ADHD (attention deficit hyperactivity disorder)   . GERD (gastroesophageal reflux disease)     Past Surgical History  Procedure Date  . Hernia repair   . Replacement total knee     Family History  Problem Relation Age of Onset  . ADD / ADHD Brother   . ADD / ADHD Maternal Aunt   . Drug abuse Maternal Uncle   . Alcohol abuse Maternal Uncle   . Schizophrenia Maternal Uncle     History  Substance Use Topics  . Smoking status: Never Smoker   . Smokeless tobacco: Not on file  . Alcohol Use: No      Review of Systems  Constitutional: Positive for fever and chills.  HENT: Negative.   Eyes: Negative.   Respiratory: Positive for cough.   Cardiovascular: Positive for chest pain.  Gastrointestinal: Negative.   Genitourinary: Negative.   Musculoskeletal: Positive for back pain.  Skin: Negative.   Neurological: Negative.   Psychiatric/Behavioral: Negative.     Allergies  Bee venom  Home  Medications   Current Outpatient Rx  Name Route Sig Dispense Refill  . AMPHETAMINE-DEXTROAMPHET ER 30 MG PO CP24 Oral Take 1 capsule (30 mg total) by mouth daily. 30 capsule 0  . LANSOPRAZOLE 30 MG PO CPDR Oral Take 1 capsule (30 mg total) by mouth daily. 20 capsule 0    BP 110/94  Pulse 99  Temp(Src) 99.4 F (37.4 C) (Oral)  Resp 20  Ht 5\' 8"  (1.727 m)  Wt 179 lb (81.194 kg)  BMI 27.22 kg/m2  SpO2 100%  Physical Exam  Nursing note and vitals reviewed. Constitutional: He is oriented to person, place, and time.       Robust young man who appears ill.  T 99.4, P 99, R 20 BP 110/94, pulse ox 100% on room air.  HENT:  Head: Normocephalic and atraumatic.  Right Ear: External ear normal.  Left Ear: External ear normal.  Mouth/Throat: Oropharynx is clear and moist.  Eyes: Conjunctivae and EOM are normal. Pupils are equal, round, and reactive to light.  Neck: Normal range of motion. Neck supple.       No meningismus.  Cardiovascular: Normal rate, regular rhythm and normal heart sounds.   Pulmonary/Chest: Effort normal and breath sounds normal.       He localizes pain to the posterior chest wall.    Abdominal: Soft. Bowel sounds are normal.  Musculoskeletal: Normal range of  motion.  Neurological: He is alert and oriented to person, place, and time.       No sensory or motor deficit.  Skin: Skin is warm and dry.  Psychiatric: He has a normal mood and affect. His behavior is normal.    ED Course  Procedures (including critical care time)  Labs Reviewed - No data to display Dg Chest 2 View  12/06/2011  *RADIOLOGY REPORT*  Clinical Data: Fever and back pain.  CHEST - 2 VIEW  Comparison: 10/12/2011  Findings: Midline trachea.  Normal heart size and mediastinal contours. No pleural effusion or pneumothorax.  Subtle peripheral left lower lobe airspace disease suspected.  IMPRESSION: Suspicion of subtle left lower lobe airspace disease/infection. Correlate with localizing symptoms to the  left lung base.  Original Report Authenticated By: Consuello Bossier, M.D.   5:48 PM Chest x-ray shows early LLL pneumonia.  Rx with azithromycin, Tussionex, Tylenol for fever.  1. Community acquired pneumonia          Carleene Cooper III, MD 12/06/11 [phone]

## 2012-01-02 ENCOUNTER — Ambulatory Visit (INDEPENDENT_AMBULATORY_CARE_PROVIDER_SITE_OTHER): Payer: Medicaid Other | Admitting: Psychiatry

## 2012-01-02 ENCOUNTER — Encounter (HOSPITAL_COMMUNITY): Payer: Self-pay | Admitting: Psychiatry

## 2012-01-02 VITALS — BP 116/80 | Ht 68.0 in | Wt 183.0 lb

## 2012-01-02 DIAGNOSIS — F902 Attention-deficit hyperactivity disorder, combined type: Secondary | ICD-10-CM

## 2012-01-02 DIAGNOSIS — F909 Attention-deficit hyperactivity disorder, unspecified type: Secondary | ICD-10-CM

## 2012-01-02 MED ORDER — AMPHETAMINE-DEXTROAMPHET ER 30 MG PO CP24: 30.0000 mg | ORAL_CAPSULE | Freq: Every day | ORAL | Status: DC

## 2012-01-02 NOTE — Progress Notes (Signed)
Patient ID: Joseph Blair, male   DOB: [birth year], 21 y.o.   MRN: [id number]   [facility] Follow-up Outpatient Visit  KASIN TONKINSON 16-Jun-1991  Date:    Subjective: I am doing well, I have enrolled for college and will start at [employer] in April.. 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 I had pneumonia 2 weeks ago and I am doing better now.. There no side effects, no safety issues.  Filed Vitals:   01/02/12 1103  BP: 116/80    Mental Status Examination  Appearance: Casually dressed Alert: Yes Attention: fair  Cooperative: Yes Eye Contact: Good Speech: Normal in volume, rate, tone, spontaneous  Psychomotor Activity: Normal Memory/Concentration: OK Oriented: person, place and situation Mood: Euthymic Affect: Full Range Thought Processes and Associations: Goal Directed Fund of Knowledge: Fair Thought Content: Suicidal ideation, Homicidal ideation, Auditory hallucinations, Visual hallucinations, Delusions and Paranoia-none reported Insight: Good Judgement: Good  Diagnosis: ADHD combined type, moderate severity, intermittent explosive disorder  Treatment Plan: Continue Adderall XR 30 mg one in the morning. Continue Prevacid which is prescribed for patient's reflux by his primary care physician Call when necessary Followup in 2-3 months  Nelly Rout, MD

## 2012-02-27 ENCOUNTER — Encounter (HOSPITAL_COMMUNITY): Payer: Self-pay | Admitting: Psychiatry

## 2012-02-27 ENCOUNTER — Ambulatory Visit (INDEPENDENT_AMBULATORY_CARE_PROVIDER_SITE_OTHER): Payer: Medicaid Other | Admitting: Psychiatry

## 2012-02-27 VITALS — BP 122/80 | Ht 68.0 in | Wt 182.0 lb

## 2012-02-27 DIAGNOSIS — F902 Attention-deficit hyperactivity disorder, combined type: Secondary | ICD-10-CM

## 2012-02-27 DIAGNOSIS — F909 Attention-deficit hyperactivity disorder, unspecified type: Secondary | ICD-10-CM

## 2012-02-27 MED ORDER — AMPHETAMINE-DEXTROAMPHET ER 30 MG PO CP24: 30.0000 mg | ORAL_CAPSULE | Freq: Every day | ORAL | Status: DC

## 2012-02-27 NOTE — Progress Notes (Signed)
Patient ID: ELIASAR HLAVATY, male   DOB: Aug 04, 1991, 21 y.o.   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  Filed Vitals:   02/27/12 1013  BP: 122/80    Mental Status Examination  Appearance: Casually dressed Alert: Yes Attention: fair  Cooperative: Yes Eye Contact: Good Speech: Normal in volume, rate, tone, spontaneous  Psychomotor Activity: Normal Memory/Concentration: OK Oriented: person, place and situation Mood: Euthymic Affect: Full Range Thought Processes and Associations: Goal Directed Fund of Knowledge: Fair Thought Content: Suicidal ideation, Homicidal ideation, Auditory hallucinations, Visual hallucinations, Delusions and Paranoia-none reported Insight: Good Judgement: Good  Diagnosis: ADHD combined type, moderate severity, intermittent explosive disorder  Treatment Plan: Continue Adderall XR 30 mg one in the morning. Call when necessary Followup in 2 months  Nelly Rout, MD

## 2012-03-27 ENCOUNTER — Emergency Department (HOSPITAL_COMMUNITY)
Admission: EM | Admit: 2012-03-27 | Discharge: 2012-03-27 | Disposition: A | Discharge status: Home / Self Care | Payer: Medicaid Other | Attending: Emergency Medicine | Admitting: Emergency Medicine

## 2012-03-27 ENCOUNTER — Encounter (HOSPITAL_COMMUNITY): Payer: Self-pay

## 2012-03-27 DIAGNOSIS — K219 Gastro-esophageal reflux disease without esophagitis: Secondary | ICD-10-CM | POA: Insufficient documentation

## 2012-03-27 DIAGNOSIS — R21 Rash and other nonspecific skin eruption: Secondary | ICD-10-CM | POA: Insufficient documentation

## 2012-03-27 DIAGNOSIS — F909 Attention-deficit hyperactivity disorder, unspecified type: Secondary | ICD-10-CM | POA: Insufficient documentation

## 2012-03-27 DIAGNOSIS — L258 Unspecified contact dermatitis due to other agents: Secondary | ICD-10-CM

## 2012-03-27 MED ORDER — TRIAMCINOLONE ACETONIDE 0.5 % EX CREA: TOPICAL_CREAM | Freq: Three times a day (TID) | CUTANEOUS | Status: DC

## 2012-03-27 NOTE — Discharge Instructions (Signed)
The dermatitis is due to sweating and friction.  Apply the prednisone cream as directed.  Use a powder such as baby powder  To keep you as dry as possible.  Follow up with your MD as needed.

## 2012-03-27 NOTE — ED Notes (Signed)
?   Heat rash to rt inner thigh

## 2012-03-27 NOTE — ED Provider Notes (Signed)
History     CSN: [id number]  Arrival date & time 03/27/12  1504   None     Chief Complaint  Patient presents with  . Rash    (Consider location/radiation/quality/duration/timing/severity/associated sxs/prior treatment) HPI Comments: Pt states that where he works the temperature is about 120 degrees and everybody sweats profusely.  He has developed redness and soreness to the inner aspects of both thighs.  Patient is a 21 y.o. male presenting with rash. The history is provided by the patient. No language interpreter was used.  Rash  This is a new problem. The current episode started 2 days ago. The problem has been gradually worsening. Associated with: profuse sweating. There has been no fever. Affected Location: both inner thighs. The pain is moderate. The pain has been constant since onset. Associated symptoms include pain. Pertinent negatives include no blisters, no itching and no weeping. He has tried nothing for the symptoms.    Past Medical History  Diagnosis Date  . ADHD (attention deficit hyperactivity disorder)   . GERD (gastroesophageal reflux disease)     Past Surgical History  Procedure Date  . Hernia repair   . Replacement total knee     Family History  Problem Relation Age of Onset  . ADD / ADHD Brother   . ADD / ADHD Maternal Aunt   . Drug abuse Maternal Uncle   . Alcohol abuse Maternal Uncle   . Schizophrenia Maternal Uncle     History  Substance Use Topics  . Smoking status: Never Smoker   . Smokeless tobacco: Not on file  . Alcohol Use: No      Review of Systems  Constitutional: Negative for fever and chills.  Skin: Positive for color change and rash. Negative for itching.  All other systems reviewed and are negative.    Allergies  Bee venom  Home Medications   Current Outpatient Rx  Name Route Sig Dispense Refill  . AMPHETAMINE-DEXTROAMPHET ER 30 MG PO CP24 Oral Take 1 capsule (30 mg total) by mouth daily. 30 capsule 0    Do not  refill until 03/29/12  . AMPHETAMINE-DEXTROAMPHET ER 30 MG PO CP24 Oral Take 1 capsule (30 mg total) by mouth every morning. 30 capsule 0    Do not 11/04/11    BP 138/72  Pulse 94  Temp 98.7 F (37.1 C) (Oral)  Resp 16  Ht 5\' 6"  (1.676 m)  Wt 184 lb (83.462 kg)  BMI 29.70 kg/m2  SpO2 97%  Physical Exam  Nursing note and vitals reviewed. Constitutional: He is oriented to person, place, and time. He appears well-developed and well-nourished.  HENT:  Head: Normocephalic and atraumatic.  Eyes: EOM are normal.  Neck: Normal range of motion.  Cardiovascular: Normal rate, regular rhythm, normal heart sounds and intact distal pulses.   Pulmonary/Chest: Effort normal and breath sounds normal. No respiratory distress.  Abdominal: Soft. He exhibits no distension. There is no tenderness.  Musculoskeletal: Normal range of motion.       Inner aspects of both thighs are red and inflamed.  Pain with walking and the areas rubbing together.  Neurological: He is alert and oriented to person, place, and time.  Skin: Skin is warm and dry. Rash noted.  Psychiatric: He has a normal mood and affect. Judgment normal.    ED Course  Procedures (including critical care time)  Labs Reviewed - No data to display No results found.   No diagnosis found.    MDM  Triamcinolone cream 0.5%,  30 gm         Worthy Rancher, [place] 03/27/12 1720

## 2012-03-28 NOTE — ED Provider Notes (Signed)
Medical screening examination/treatment/procedure(s) were performed by non-physician practitioner and as supervising physician I was immediately available for consultation/collaboration.   Laray Anger, DO 03/28/12 1213

## 2012-04-10 ENCOUNTER — Emergency Department (HOSPITAL_COMMUNITY): Payer: Medicaid Other

## 2012-04-10 ENCOUNTER — Encounter (HOSPITAL_COMMUNITY): Payer: Self-pay

## 2012-04-10 ENCOUNTER — Emergency Department (HOSPITAL_COMMUNITY)
Admission: EM | Admit: 2012-04-10 | Discharge: 2012-04-10 | Disposition: A | Discharge status: Home / Self Care | Payer: Medicaid Other | Attending: Emergency Medicine | Admitting: Emergency Medicine

## 2012-04-10 DIAGNOSIS — K219 Gastro-esophageal reflux disease without esophagitis: Secondary | ICD-10-CM | POA: Insufficient documentation

## 2012-04-10 DIAGNOSIS — Z79899 Other long term (current) drug therapy: Secondary | ICD-10-CM | POA: Insufficient documentation

## 2012-04-10 DIAGNOSIS — M940 Chondrocostal junction syndrome [Tietze]: Secondary | ICD-10-CM | POA: Insufficient documentation

## 2012-04-10 DIAGNOSIS — Z91038 Other insect allergy status: Secondary | ICD-10-CM | POA: Insufficient documentation

## 2012-04-10 DIAGNOSIS — F909 Attention-deficit hyperactivity disorder, unspecified type: Secondary | ICD-10-CM | POA: Insufficient documentation

## 2012-04-10 DIAGNOSIS — J069 Acute upper respiratory infection, unspecified: Secondary | ICD-10-CM | POA: Insufficient documentation

## 2012-04-10 MED ORDER — IBUPROFEN 800 MG PO TABS: 800.0000 mg | ORAL_TABLET | Freq: Three times a day (TID) | ORAL | Status: AC

## 2012-04-10 MED ORDER — KETOROLAC TROMETHAMINE 60 MG/2ML IM SOLN
60.0000 mg | Freq: Once | INTRAMUSCULAR | Status: AC
  Administered 2012-04-10: 60 mg via INTRAMUSCULAR
  Filled 2012-04-10: qty 2

## 2012-04-10 NOTE — Discharge Instructions (Signed)
Chest Wall Pain Chest wall pain is pain in or around the bones and muscles of your chest. It may take up to 6 weeks to get better. It may take longer if you must stay physically active in your work and activities.  CAUSES  Chest wall pain may happen on its own. However, it may be caused by:  A viral illness like the flu.   Injury.   Coughing.   Exercise.   Arthritis.   Fibromyalgia.   Shingles.  HOME CARE INSTRUCTIONS   Avoid overtiring physical activity. Try not to strain or perform activities that cause pain. This includes any activities using your chest or your abdominal and side muscles, especially if heavy weights are used.   Put ice on the sore area.   Put ice in a plastic bag.   Place a towel between your skin and the bag.   Leave the ice on for 15 to 20 minutes per hour while awake for the first 2 days.   Only take over-the-counter or prescription medicines for pain, discomfort, or fever as directed by your caregiver.  SEEK IMMEDIATE MEDICAL CARE IF:   Your pain increases, or you are very uncomfortable.   You have a fever.   Your chest pain becomes worse.   You have new, unexplained symptoms.   You have nausea or vomiting.   You feel sweaty or lightheaded.   You have a cough with phlegm (sputum), or you cough up blood.  MAKE SURE YOU:   Understand these instructions.   Will watch your condition.   Will get help right away if you are not doing well or get worse.  Document Released: 10/01/2005 Document Revised: 09/20/2011 Document Reviewed: 05/28/2011 Exit[employer] Patient Information 2012 [employer]. 

## 2012-04-10 NOTE — ED Notes (Signed)
Sore throat, cough, started on Tuesday per pt. Hurting in chest and in back per pt. Coughing up brown phelm per pt.

## 2012-04-10 NOTE — ED Notes (Signed)
States that he has a sore throat and cough that started at the beginning of the week, states he has had chest pains that are sharp in nature that radiate towards his left ribs that also started on Monday of this week.

## 2012-04-11 NOTE — ED Provider Notes (Signed)
History     CSN: [id number]  Arrival date & time 04/10/12  1857   First MD Initiated Contact with Patient 04/10/12 2012      Chief Complaint  Patient presents with  . Sore Throat  . Cough    (Consider location/radiation/quality/duration/timing/severity/associated sxs/prior treatment) HPI Comments: Joseph Blair presents with a 2 day history of sore throat,  Cough which has been productive of brown sputum and with complaint of left sided chest pain which is worse with cough,  Deep inspiration and with palpation.  He denies shortness of breath, wheezing, weakness, fatigue, Nausea,  Emesis, palpitations and diaphoresis.  He has had clear nasal discharge as well,  No documented fever  But has felt "hot".  He smokes 1 "black and mild" daily.  He denies chest trauma.  Patient is a 21 y.o. male presenting with pharyngitis and cough. The history is provided by the patient.  Sore Throat Associated symptoms include coughing and a sore throat. Pertinent negatives include no abdominal pain, arthralgias, chest pain, congestion, fever, headaches, joint swelling, nausea, neck pain, numbness, rash or weakness.  Cough Associated symptoms include rhinorrhea and sore throat. Pertinent negatives include no chest pain, no headaches, no shortness of breath and no wheezing.    Past Medical History  Diagnosis Date  . ADHD (attention deficit hyperactivity disorder)   . GERD (gastroesophageal reflux disease)     Past Surgical History  Procedure Date  . Hernia repair   . Replacement total knee     Family History  Problem Relation Age of Onset  . ADD / ADHD Brother   . ADD / ADHD Maternal Aunt   . Drug abuse Maternal Uncle   . Alcohol abuse Maternal Uncle   . Schizophrenia Maternal Uncle     History  Substance Use Topics  . Smoking status: Never Smoker   . Smokeless tobacco: Not on file  . Alcohol Use: No      Review of Systems  Constitutional: Negative for fever.  HENT: Positive  for sore throat and rhinorrhea. Negative for congestion and neck pain.   Eyes: Negative.   Respiratory: Positive for cough. Negative for chest tightness, shortness of breath and wheezing.   Cardiovascular: Negative for chest pain and palpitations.  Gastrointestinal: Negative for nausea and abdominal pain.  Genitourinary: Negative.   Musculoskeletal: Negative for joint swelling and arthralgias.  Skin: Negative.  Negative for rash and wound.  Neurological: Negative for dizziness, weakness, light-headedness, numbness and headaches.  Hematological: Negative.   Psychiatric/Behavioral: Negative.     Allergies  Bee venom  Home Medications   Current Outpatient Rx  Name Route Sig Dispense Refill  . AMPHETAMINE-DEXTROAMPHET ER 30 MG PO CP24 Oral Take 1 capsule (30 mg total) by mouth daily. 30 capsule 0    Do not refill until 03/29/12  . TRIAMCINOLONE ACETONIDE 0.5 % EX CREA Topical Apply topically 3 (three) times daily. 30 g 0    Do not apply to face or genitalia  . AMPHETAMINE-DEXTROAMPHET ER 30 MG PO CP24 Oral Take 1 capsule (30 mg total) by mouth every morning. 30 capsule 0    Do not 11/04/11  . IBUPROFEN 800 MG PO TABS Oral Take 1 tablet (800 mg total) by mouth 3 (three) times daily. 21 tablet 0    BP 143/88  Pulse 92  Temp 98.4 F (36.9 C) (Oral)  Resp 20  Ht 5' 6.5" (1.689 m)  Wt 186 lb (84.369 kg)  BMI 29.57 kg/m2  SpO2 99%  Physical Exam  Constitutional: He is oriented to person, place, and time. He appears well-developed and well-nourished.  HENT:  Head: Normocephalic and atraumatic.  Right Ear: Tympanic membrane and ear canal normal.  Left Ear: Tympanic membrane and ear canal normal.  Nose: Rhinorrhea present.  Mouth/Throat: Uvula is midline, oropharynx is clear and moist and mucous membranes are normal. No oropharyngeal exudate, posterior oropharyngeal edema, posterior oropharyngeal erythema or tonsillar abscesses.  Eyes: Conjunctivae are normal.  Pulmonary/Chest:  Effort normal. No respiratory distress. He has no wheezes. He has no rales. He exhibits tenderness.         ttp with palpation along left lower anterior ribs,  No palpable deformity.  Abdominal: Soft. There is no tenderness.  Musculoskeletal: Normal range of motion.  Neurological: He is alert and oriented to person, place, and time.  Skin: Skin is warm and dry. No rash noted.  Psychiatric: He has a normal mood and affect.    ED Course  Procedures (including critical care time)  Labs Reviewed - No data to display Dg Chest 2 View  04/10/2012  *RADIOLOGY REPORT*  Clinical Data: Cough, sore throat, left-sided pain.  Smoker.  CHEST - 2 VIEW  Comparison: 12/06/2011  Findings: Normal heart size and pulmonary vascularity. Hyperinflation suggesting emphysematous change.  No focal airspace consolidation in the lungs.  No blunting of costophrenic angles. No pneumothorax.  No significant changes since previous study.  IMPRESSION: Emphysematous changes in the lungs.  No evidence of active pulmonary disease.  Original Report Authenticated By: Marlon Pel, M.D.     1. Costochondral chest pain   2. URI (upper respiratory infection)       MDM  Uri/bronchitis with chest wall pain/costochondritis.  xrays reviewed prior to dc home.  Pt encouraged to stop smoking.  He was prescribed ibuprofen for chest wall pain,  Encouraged heat therapy.  Motrin prn sore throat.  Xrays discussed with pt. PRN f/u with pcp if sx not better.  (states he tried to get in with pcp today - unable - needed a doctors note to return to work).        Burgess Amor, [place] 04/11/12 1607

## 2012-04-14 NOTE — ED Provider Notes (Signed)
Medical screening examination/treatment/procedure(s) were performed by non-physician practitioner and as supervising physician I was immediately available for consultation/collaboration.   Dione Booze, MD 04/14/12 [phone]

## 2012-04-30 ENCOUNTER — Other Ambulatory Visit (HOSPITAL_COMMUNITY): Payer: Self-pay | Admitting: *Deleted

## 2012-04-30 ENCOUNTER — Ambulatory Visit (HOSPITAL_COMMUNITY): Payer: Self-pay | Admitting: Psychiatry

## 2012-04-30 DIAGNOSIS — F902 Attention-deficit hyperactivity disorder, combined type: Secondary | ICD-10-CM

## 2012-05-06 ENCOUNTER — Other Ambulatory Visit (HOSPITAL_COMMUNITY): Payer: Self-pay | Admitting: Psychiatry

## 2012-05-06 ENCOUNTER — Telehealth (HOSPITAL_COMMUNITY): Payer: Self-pay | Admitting: *Deleted

## 2012-05-06 DIAGNOSIS — F902 Attention-deficit hyperactivity disorder, combined type: Secondary | ICD-10-CM

## 2012-05-06 MED ORDER — AMPHETAMINE-DEXTROAMPHET ER 30 MG PO CP24: 30.0000 mg | ORAL_CAPSULE | Freq: Every day | ORAL | Status: DC

## 2012-05-09 ENCOUNTER — Emergency Department (HOSPITAL_COMMUNITY): Payer: Medicaid Other

## 2012-05-09 ENCOUNTER — Emergency Department (HOSPITAL_COMMUNITY)
Admission: EM | Admit: 2012-05-09 | Discharge: 2012-05-09 | Disposition: A | Discharge status: Home / Self Care | Payer: Medicaid Other | Attending: Emergency Medicine | Admitting: Emergency Medicine

## 2012-05-09 ENCOUNTER — Encounter (HOSPITAL_COMMUNITY): Payer: Self-pay

## 2012-05-09 DIAGNOSIS — F909 Attention-deficit hyperactivity disorder, unspecified type: Secondary | ICD-10-CM | POA: Insufficient documentation

## 2012-05-09 DIAGNOSIS — R109 Unspecified abdominal pain: Secondary | ICD-10-CM

## 2012-05-09 DIAGNOSIS — K219 Gastro-esophageal reflux disease without esophagitis: Secondary | ICD-10-CM | POA: Insufficient documentation

## 2012-05-09 LAB — URINE MICROSCOPIC-ADD ON

## 2012-05-09 LAB — URINALYSIS, ROUTINE W REFLEX MICROSCOPIC
Leukocytes, UA: NEGATIVE
Nitrite: NEGATIVE
Specific Gravity, Urine: >1.03 — ABNORMAL HIGH (ref 1.005–1.030)
Urobilinogen, UA: 0.2 mg/dL (ref 0.0–1.0)
pH: 6 (ref 5.0–8.0)

## 2012-05-09 MED ORDER — HYDROCODONE-ACETAMINOPHEN 5-500 MG PO TABS: 1.0000 | ORAL_TABLET | Freq: Four times a day (QID) | ORAL | Status: AC | PRN

## 2012-05-09 MED ORDER — KETOROLAC TROMETHAMINE 60 MG/2ML IM SOLN
60.0000 mg | Freq: Once | INTRAMUSCULAR | Status: AC
  Administered 2012-05-09: 60 mg via INTRAMUSCULAR
  Filled 2012-05-09: qty 2

## 2012-05-09 NOTE — ED Provider Notes (Signed)
History  This chart was scribed for Joseph Lyons, MD by Joseph Blair. This patient was seen in room APA15/[facility]15 and the patient's care was started at 4:58PM.  CSN: [id number]  Arrival date & time 05/09/12  1614   First MD Initiated Contact with Patient 05/09/12 1658      Chief Complaint  Patient presents with  . Abdominal Pain    The history is provided by the patient. No language interpreter was used.    Joseph Blair is a 21 y.o. male who presents to the Emergency Department complaining of 9 to 10 weeks of gradual onset, intermittently felt suprapubic abdominal pain located in the middle right above his genital area with associated one episode of diarrhea with the original onset and occasional dysuria. He reports that the pain was improving until today when it became constant and significantly worse. He denies that the pain is off to one side. He denies having any modifying factors. He denies taking OTC medications at home to improve symptoms. He denies having prior episodes of similar symptoms.He denies penile discharge, rash, fever, back pain, nausea, emesis and hematuria as associated symptoms. He reports having a h/o right inguinal hernia around 12 or 13 years ago and states that he has a family h/o hernias. He denies smoking and alcohol use.  PCP is Dr. Milford Cage.  Past Medical History  Diagnosis Date  . ADHD (attention deficit hyperactivity disorder)   . GERD (gastroesophageal reflux disease)     Past Surgical History  Procedure Date  . Hernia repair   . Replacement total knee     Family History  Problem Relation Age of Onset  . ADD / ADHD Brother   . ADD / ADHD Maternal Aunt   . Drug abuse Maternal Uncle   . Alcohol abuse Maternal Uncle   . Schizophrenia Maternal Uncle     History  Substance Use Topics  . Smoking status: Never Smoker   . Smokeless tobacco: Not on file  . Alcohol Use: No      Review of Systems  Constitutional: Negative for fever and  chills.  Gastrointestinal: Positive for abdominal pain and diarrhea. Negative for nausea and vomiting.  Genitourinary: Positive for dysuria. Negative for hematuria and discharge.  Musculoskeletal: Negative for back pain.  Skin: Negative for rash and wound.    Allergies  Bee venom  Home Medications   Current Outpatient Rx  Name Route Sig Dispense Refill  . AMPHETAMINE-DEXTROAMPHET ER 30 MG PO CP24 Oral Take 1 capsule (30 mg total) by mouth daily. 30 capsule 0  . TRIAMCINOLONE ACETONIDE 0.5 % EX CREA Topical Apply topically 3 (three) times daily. 30 g 0    Do not apply to face or genitalia    Triage Vitals: BP 132/80  Pulse 72  Temp 97.9 F (36.6 C) (Oral)  Resp 20  Ht 5' 6.5" (1.689 m)  Wt 185 lb (83.915 kg)  BMI 29.41 kg/m2  SpO2 100%  Physical Exam  Nursing note and vitals reviewed. Constitutional: He is oriented to person, place, and time. He appears well-developed and well-nourished. No distress.  HENT:  Head: Normocephalic and atraumatic.  Eyes: EOM are normal.  Neck: Neck supple. No tracheal deviation present.  Cardiovascular: Normal rate and regular rhythm.   No murmur heard. Pulmonary/Chest: Effort normal and breath sounds normal. No respiratory distress.  Abdominal: Soft. Bowel sounds are normal. There is tenderness (tenderness to the suprapubic region). There is no rebound and no guarding.  No palpable hernias   Musculoskeletal: Normal range of motion. He exhibits no edema and no tenderness.  Neurological: He is alert and oriented to person, place, and time.  Skin: Skin is warm and dry.  Psychiatric: He has a normal mood and affect. His behavior is normal.    ED Course  Procedures (including critical care time)  DIAGNOSTIC STUDIES: Oxygen Saturation is 100% on room air, normal by my interpretation.    COORDINATION OF CARE: 5:24PM-Discussed treatment plan which includes an abdominal CT scan and urinalysis with pt at bedside and pt agreed to  plan.    Labs Reviewed  URINALYSIS, ROUTINE W REFLEX MICROSCOPIC   No results found.   No diagnosis found.    MDM  The urine shows trace blood, however the ct does not show a kidney stone nor does it show any alternative diagnosis.  He has a history of a hernia repair at about age 76, however there is no evidence of hernia on the ct.  He will discharged to home with a prescription for a limited number of lortab.  He is to to return for worsening pain or fever.      I personally performed the services described in this documentation, which was scribed in my presence. The recorded information has been reviewed and considered.         Joseph Lyons, MD 05/09/12 [phone]

## 2012-05-09 NOTE — ED Notes (Signed)
Complain of lower abdominal pain. Denies n/v/d or fever

## 2012-05-14 ENCOUNTER — Other Ambulatory Visit (HOSPITAL_COMMUNITY): Payer: Self-pay | Admitting: Psychiatry

## 2012-06-11 ENCOUNTER — Ambulatory Visit (INDEPENDENT_AMBULATORY_CARE_PROVIDER_SITE_OTHER): Payer: Medicaid Other | Admitting: Psychiatry

## 2012-06-11 ENCOUNTER — Encounter (HOSPITAL_COMMUNITY): Payer: Self-pay | Admitting: Psychiatry

## 2012-06-11 VITALS — BP 124/80 | Ht 67.25 in | Wt 192.6 lb

## 2012-06-11 DIAGNOSIS — F909 Attention-deficit hyperactivity disorder, unspecified type: Secondary | ICD-10-CM

## 2012-06-11 DIAGNOSIS — F902 Attention-deficit hyperactivity disorder, combined type: Secondary | ICD-10-CM

## 2012-06-11 DIAGNOSIS — F6381 Intermittent explosive disorder: Secondary | ICD-10-CM

## 2012-06-11 MED ORDER — AMPHETAMINE-DEXTROAMPHET ER 30 MG PO CP24: 30.0000 mg | ORAL_CAPSULE | Freq: Every day | ORAL | Status: DC

## 2012-06-11 MED ORDER — AMPHETAMINE-DEXTROAMPHET ER 30 MG PO CP24: 30.0000 mg | ORAL_CAPSULE | ORAL | Status: DC

## 2012-06-11 NOTE — Progress Notes (Signed)
Patient ID: Joseph Blair, male   DOB: Apr 20, 1991, 21 y.o.   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 I started school last week and I am taking classes 3 days a week. I'm not having any side effects with the medication and there no safety concerns.   Filed Vitals:   06/11/12 0958  BP: 124/80    Mental Status Examination  Appearance: Casually dressed Alert: Yes Attention: fair  Cooperative: Yes Eye Contact: Good Speech: Normal in volume, rate, tone, spontaneous  Psychomotor Activity: Normal Memory/Concentration: OK Oriented: person, place and situation Mood: Euthymic Affect: Full Range Thought Processes and Associations: Goal Directed Fund of Knowledge: Fair Thought Content: Suicidal ideation, Homicidal ideation, Auditory hallucinations, Visual hallucinations, Delusions and Paranoia-none reported Insight: Good Judgement: Good  Diagnosis: ADHD combined type, moderate severity, intermittent explosive disorder  Treatment Plan: Continue Adderall XR 30 mg one in the morning for ADHD combined type Call when necessary Followup in 2 months  Nelly Rout, MD

## 2012-08-06 ENCOUNTER — Ambulatory Visit (INDEPENDENT_AMBULATORY_CARE_PROVIDER_SITE_OTHER): Payer: MEDICAID | Admitting: Psychiatry

## 2012-08-06 ENCOUNTER — Encounter (HOSPITAL_COMMUNITY): Payer: Self-pay | Admitting: Psychiatry

## 2012-08-06 VITALS — BP 130/80 | Ht 67.5 in | Wt 193.6 lb

## 2012-08-06 DIAGNOSIS — F6381 Intermittent explosive disorder: Secondary | ICD-10-CM

## 2012-08-06 DIAGNOSIS — F902 Attention-deficit hyperactivity disorder, combined type: Secondary | ICD-10-CM

## 2012-08-06 DIAGNOSIS — F909 Attention-deficit hyperactivity disorder, unspecified type: Secondary | ICD-10-CM

## 2012-08-06 MED ORDER — AMPHETAMINE-DEXTROAMPHET ER 30 MG PO CP24: 30.0000 mg | ORAL_CAPSULE | Freq: Every day | ORAL | Status: DC

## 2012-08-06 MED ORDER — AMPHETAMINE-DEXTROAMPHET ER 30 MG PO CP24: 30.0000 mg | ORAL_CAPSULE | ORAL | Status: DC

## 2012-08-06 NOTE — Progress Notes (Signed)
Patient ID: Joseph Blair, male   DOB: 11-25-1990, 21 y.o.   MRN: [id number]   [facility] Follow-up Outpatient Visit  Joseph Blair December 17, 1990  Date:    Subjective: I am doing well, I am still working at [employer] and I am also taking 3 classes at [employer] I'm taking a classes in welding, electrical and computers. I'm not having any side effects with the medication and there no safety concerns.   Filed Vitals:   08/06/12 0953  BP: 130/80    Mental Status Examination  Appearance: Casually dressed Alert: Yes Attention: fair  Cooperative: Yes Eye Contact: Good Speech: Normal in volume, rate, tone, spontaneous  Psychomotor Activity: Normal Memory/Concentration: OK Oriented: person, place and situation Mood: Euthymic Affect: Full Range Thought Processes and Associations: Goal Directed Fund of Knowledge: Fair Thought Content: Suicidal ideation, Homicidal ideation, Auditory hallucinations, Visual hallucinations, Delusions and Paranoia-none reported Insight: Good Judgement: Good  Diagnosis: ADHD combined type, moderate severity, intermittent explosive disorder  Treatment Plan: Continue Adderall XR 30 mg one in the morning for ADHD combined type Call when necessary Followup in 3 months  Nelly Rout, MD

## 2012-09-13 ENCOUNTER — Emergency Department (HOSPITAL_COMMUNITY)
Admission: EM | Admit: 2012-09-13 | Discharge: 2012-09-13 | Disposition: A | Discharge status: Home / Self Care | Payer: Medicaid Other | Attending: Emergency Medicine | Admitting: Emergency Medicine

## 2012-09-13 ENCOUNTER — Encounter (HOSPITAL_COMMUNITY): Payer: Self-pay

## 2012-09-13 ENCOUNTER — Emergency Department (HOSPITAL_COMMUNITY): Payer: Medicaid Other

## 2012-09-13 DIAGNOSIS — Z79899 Other long term (current) drug therapy: Secondary | ICD-10-CM | POA: Insufficient documentation

## 2012-09-13 DIAGNOSIS — Y9367 Activity, basketball: Secondary | ICD-10-CM | POA: Insufficient documentation

## 2012-09-13 DIAGNOSIS — S62113A Displaced fracture of triquetrum [cuneiform] bone, unspecified wrist, initial encounter for closed fracture: Secondary | ICD-10-CM | POA: Insufficient documentation

## 2012-09-13 DIAGNOSIS — Y929 Unspecified place or not applicable: Secondary | ICD-10-CM | POA: Insufficient documentation

## 2012-09-13 DIAGNOSIS — X58XXXA Exposure to other specified factors, initial encounter: Secondary | ICD-10-CM | POA: Insufficient documentation

## 2012-09-13 DIAGNOSIS — M2550 Pain in unspecified joint: Secondary | ICD-10-CM | POA: Insufficient documentation

## 2012-09-13 DIAGNOSIS — Z8719 Personal history of other diseases of the digestive system: Secondary | ICD-10-CM | POA: Insufficient documentation

## 2012-09-13 DIAGNOSIS — F909 Attention-deficit hyperactivity disorder, unspecified type: Secondary | ICD-10-CM | POA: Insufficient documentation

## 2012-09-13 MED ORDER — HYDROCODONE-ACETAMINOPHEN 5-325 MG PO TABS
2.0000 | ORAL_TABLET | Freq: Once | ORAL | Status: AC
  Administered 2012-09-13: 2 via ORAL
  Filled 2012-09-13: qty 2

## 2012-09-13 MED ORDER — HYDROCODONE-ACETAMINOPHEN 5-325 MG PO TABS: 1.0000 | ORAL_TABLET | ORAL | Status: DC | PRN

## 2012-09-13 MED ORDER — ONDANSETRON HCL 4 MG PO TABS
4.0000 mg | ORAL_TABLET | Freq: Once | ORAL | Status: AC
  Administered 2012-09-13: 4 mg via ORAL
  Filled 2012-09-13 (×2): qty 1

## 2012-09-13 MED ORDER — MELOXICAM 15 MG PO TABS: 15.0000 mg | ORAL_TABLET | Freq: Every day | ORAL | Status: DC

## 2012-09-13 MED ORDER — KETOROLAC TROMETHAMINE 10 MG PO TABS
10.0000 mg | ORAL_TABLET | Freq: Once | ORAL | Status: AC
  Administered 2012-09-13: 10 mg via ORAL
  Filled 2012-09-13: qty 1

## 2012-09-13 NOTE — ED Provider Notes (Signed)
History     CSN: [id number]  Arrival date & time 09/13/12  1446   First MD Initiated Contact with Patient 09/13/12 1605      Chief Complaint  Patient presents with  . Hand Injury    (Consider location/radiation/quality/duration/timing/severity/associated sxs/prior treatment) Patient is a 21 y.o. male presenting with hand injury. The history is provided by the patient.  Hand Injury  The incident occurred 2 days ago. Injury mechanism: playing basket ball. The pain is present in the right wrist. The quality of the pain is described as aching. The pain is moderate. The pain has been fluctuating since the incident. Pertinent negatives include no fever and no malaise/fatigue. He reports no foreign bodies present. The symptoms are aggravated by movement. He has tried nothing for the symptoms.    Past Medical History  Diagnosis Date  . ADHD (attention deficit hyperactivity disorder)   . GERD (gastroesophageal reflux disease)     Past Surgical History  Procedure Date  . Hernia repair   . Replacement total knee     Family History  Problem Relation Age of Onset  . ADD / ADHD Brother   . ADD / ADHD Maternal Aunt   . Drug abuse Maternal Uncle   . Alcohol abuse Maternal Uncle   . Schizophrenia Maternal Uncle     History  Substance Use Topics  . Smoking status: Never Smoker   . Smokeless tobacco: Not on file  . Alcohol Use: No      Review of Systems  Constitutional: Negative for fever, malaise/fatigue and activity change.       All ROS Neg except as noted in HPI  HENT: Negative for nosebleeds and neck pain.   Eyes: Negative for photophobia and discharge.  Respiratory: Negative for cough, shortness of breath and wheezing.   Cardiovascular: Negative for chest pain and palpitations.  Gastrointestinal: Negative for abdominal pain and blood in stool.  Genitourinary: Negative for dysuria, frequency and hematuria.  Musculoskeletal: Positive for arthralgias. Negative for back  pain.  Skin: Negative.   Neurological: Negative for dizziness, seizures and speech difficulty.  Psychiatric/Behavioral: Negative for hallucinations and confusion.    Allergies  Bee venom  Home Medications   Current Outpatient Rx  Name  Route  Sig  Dispense  Refill  . AMPHETAMINE-DEXTROAMPHET ER 30 MG PO CP24   Oral   Take 1 capsule (30 mg total) by mouth every morning.   30 capsule   0     Do not refill until 09/06/12   . AMPHETAMINE-DEXTROAMPHET ER 30 MG PO CP24   Oral   Take 1 capsule (30 mg total) by mouth daily.   30 capsule   0   . AMPHETAMINE-DEXTROAMPHET ER 30 MG PO CP24   Oral   Take 1 capsule (30 mg total) by mouth every morning.   30 capsule   0     Do not refill until 10/04/12   . TRIAMCINOLONE ACETONIDE 0.5 % EX CREA   Topical   Apply topically 3 (three) times daily.   30 g   0     Do not apply to face or genitalia     Pulse 88  Temp 98.4 F (36.9 C) (Oral)  Resp 20  Ht 5\' 7"  (1.702 m)  Wt 186 lb (84.369 kg)  BMI 29.13 kg/m2  SpO2 100%  Physical Exam  Nursing note and vitals reviewed. Constitutional: He is oriented to person, place, and time. He appears well-developed and well-nourished.  Non-toxic appearance.  HENT:  Head: Normocephalic.  Right Ear: Tympanic membrane and external ear normal.  Left Ear: Tympanic membrane and external ear normal.  Eyes: EOM and lids are normal. Pupils are equal, round, and reactive to light.  Neck: Normal range of motion. Neck supple. Carotid bruit is not present.  Cardiovascular: Normal rate, regular rhythm, normal heart sounds, intact distal pulses and normal pulses.   Pulmonary/Chest: Breath sounds normal. No respiratory distress.  Abdominal: Soft. Bowel sounds are normal. There is no tenderness. There is no guarding.  Musculoskeletal: Normal range of motion.       FROM of the right shoulder and elbow. Pain with flex and extension of the right wrist. Pain in the "snuff box" . No palpable deformity.  Radial pulse symmetrical. Cap refill less than 3 sec.  Lymphadenopathy:       Head (right side): No submandibular adenopathy present.       Head (left side): No submandibular adenopathy present.    He has no cervical adenopathy.  Neurological: He is alert and oriented to person, place, and time. He has normal strength. No cranial nerve deficit or sensory deficit.  Skin: Skin is warm and dry.  Psychiatric: He has a normal mood and affect. His speech is normal.    ED Course  Procedures (including critical care time)  Labs Reviewed - No data to display No results found.   No diagnosis found.    MDM  I have reviewed nursing notes, vital signs, and all appropriate lab and imaging results for this patient. Right wrist xray reveals a triquetral fracture. Pt placed in a thumb spika splint, and ice applied. Pt placed in a splint. Rx for norco and mobic given to the patient. He is to see Dr Izora Ribas (hand specialist) for follow up.       Kathie Dike, [place] 09/18/12 [phone]

## 2012-09-13 NOTE — ED Notes (Signed)
Pt presents with basketball accident yesterday to the right hand. Pt states pain has increased and use has decreased. No deformity noted. Pulses present with good cap refill.

## 2012-09-13 NOTE — ED Notes (Signed)
Pt reports that he injured his right wrist Thursday playing basketball.

## 2012-09-17 ENCOUNTER — Encounter (HOSPITAL_COMMUNITY): Payer: Self-pay | Admitting: *Deleted

## 2012-09-17 ENCOUNTER — Emergency Department (HOSPITAL_COMMUNITY)
Admission: EM | Admit: 2012-09-17 | Discharge: 2012-09-17 | Disposition: A | Discharge status: Home / Self Care | Payer: Medicaid Other | Attending: Emergency Medicine | Admitting: Emergency Medicine

## 2012-09-17 DIAGNOSIS — Z8719 Personal history of other diseases of the digestive system: Secondary | ICD-10-CM | POA: Insufficient documentation

## 2012-09-17 DIAGNOSIS — Z79899 Other long term (current) drug therapy: Secondary | ICD-10-CM | POA: Insufficient documentation

## 2012-09-17 DIAGNOSIS — Y9389 Activity, other specified: Secondary | ICD-10-CM | POA: Insufficient documentation

## 2012-09-17 DIAGNOSIS — S62113A Displaced fracture of triquetrum [cuneiform] bone, unspecified wrist, initial encounter for closed fracture: Secondary | ICD-10-CM | POA: Insufficient documentation

## 2012-09-17 DIAGNOSIS — R296 Repeated falls: Secondary | ICD-10-CM | POA: Insufficient documentation

## 2012-09-17 DIAGNOSIS — Y9239 Other specified sports and athletic area as the place of occurrence of the external cause: Secondary | ICD-10-CM | POA: Insufficient documentation

## 2012-09-17 DIAGNOSIS — M2550 Pain in unspecified joint: Secondary | ICD-10-CM | POA: Insufficient documentation

## 2012-09-17 DIAGNOSIS — Y92838 Other recreation area as the place of occurrence of the external cause: Secondary | ICD-10-CM | POA: Insufficient documentation

## 2012-09-17 DIAGNOSIS — F909 Attention-deficit hyperactivity disorder, unspecified type: Secondary | ICD-10-CM | POA: Insufficient documentation

## 2012-09-17 DIAGNOSIS — M25539 Pain in unspecified wrist: Secondary | ICD-10-CM

## 2012-09-17 MED ORDER — OXYCODONE-ACETAMINOPHEN 5-325 MG PO TABS
1.0000 | ORAL_TABLET | Freq: Once | ORAL | Status: AC
  Administered 2012-09-17: 1 via ORAL
  Filled 2012-09-17: qty 1

## 2012-09-17 MED ORDER — OXYCODONE-ACETAMINOPHEN 5-325 MG PO TABS
1.0000 | ORAL_TABLET | ORAL | Status: AC | PRN
Start: 2012-09-17 — End: 2012-09-27

## 2012-09-17 NOTE — ED Notes (Signed)
Seen here 09/13/12 for wrist fx.  States pain meds are not working and cannot get in to see PCP until Friday.

## 2012-09-17 NOTE — ED Provider Notes (Signed)
History     CSN: [id number]  Arrival date & time 09/17/12  1524   First MD Initiated Contact with Patient 09/17/12 1539      Chief Complaint  Patient presents with  . Wrist Pain    (Consider location/radiation/quality/duration/timing/severity/associated sxs/prior treatment) HPI Comments: Patient c/o persistent right wrist pain for several days after a fall that occurred while playing basketball.  He was seen here at the time of the accident and diagnosed with a triquetral fracture.  He c/o today of persistent pain to his wrist, but states the pain is mostly to the lateral wrist.  Also c/o mild swelling to the lateral wrist.  States the thumb spica and pain medication  that was given on the previous visit is not helping his wrist pain.  He states that he made an appointment with Dr. Hilda Lias for Friday.  He denies numbness of wrist or fingers, redness, or proximal tenderness.  Patient is a 21 y.o. male presenting with wrist pain. The history is provided by the patient.  Wrist Pain This is a new problem. The current episode started in the past 7 days. The problem occurs constantly. The problem has been unchanged. Associated symptoms include arthralgias and joint swelling. Pertinent negatives include no chills, congestion, fever, myalgias, nausea, neck pain, numbness, rash, vomiting or weakness. The symptoms are aggravated by bending (movement and palpation). He has tried NSAIDs and oral narcotics for the symptoms. The treatment provided no relief.    Past Medical History  Diagnosis Date  . ADHD (attention deficit hyperactivity disorder)   . GERD (gastroesophageal reflux disease)     Past Surgical History  Procedure Date  . Hernia repair   . Replacement total knee     Family History  Problem Relation Age of Onset  . ADD / ADHD Brother   . ADD / ADHD Maternal Aunt   . Drug abuse Maternal Uncle   . Alcohol abuse Maternal Uncle   . Schizophrenia Maternal Uncle     History    Substance Use Topics  . Smoking status: Never Smoker   . Smokeless tobacco: Not on file  . Alcohol Use: No      Review of Systems  Constitutional: Negative for fever and chills.  HENT: Negative for congestion and neck pain.   Gastrointestinal: Negative for nausea and vomiting.  Genitourinary: Negative for dysuria and difficulty urinating.  Musculoskeletal: Positive for joint swelling and arthralgias. Negative for myalgias.  Skin: Negative for color change, rash and wound.  Neurological: Negative for dizziness, weakness and numbness.  All other systems reviewed and are negative.    Allergies  Bee venom  Home Medications   Current Outpatient Rx  Name  Route  Sig  Dispense  Refill  . AMPHETAMINE-DEXTROAMPHET ER 30 MG PO CP24   Oral   Take 1 capsule (30 mg total) by mouth every morning.   30 capsule   0     Do not refill until 09/06/12   . HYDROCODONE-ACETAMINOPHEN 5-325 MG PO TABS   Oral   Take 1 tablet by mouth every 4 (four) hours as needed for pain.   20 tablet   0   . MELOXICAM 15 MG PO TABS   Oral   Take 1 tablet (15 mg total) by mouth daily.   7 tablet   0     BP 133/77  Pulse 86  Temp 97.7 F (36.5 C) (Oral)  Resp 20  Ht 5\' 8"  (1.727 m)  Wt 186 lb (84.369  kg)  BMI 28.28 kg/m2  SpO2 100%  Physical Exam  Nursing note and vitals reviewed. Constitutional: He is oriented to person, place, and time. He appears well-developed and well-nourished. No distress.  HENT:  Head: Normocephalic and atraumatic.  Cardiovascular: Normal rate, regular rhythm and normal heart sounds.   Pulmonary/Chest: Effort normal and breath sounds normal.  Musculoskeletal: He exhibits tenderness. He exhibits no edema.       Right hand: He exhibits decreased range of motion, tenderness, bony tenderness and swelling. He exhibits normal two-point discrimination, normal capillary refill, no deformity and no laceration. normal sensation noted. Normal strength noted.       Hands:       Right lateral wrist and proximal thumb are ttp, but most of the tenderness is primarily at the lateral wrist. Mild lateral STS.  No bruising or elbow pain.  Radial pulse is brisk, sensation intact.  CR< 2 sec.    Neurological: He is alert and oriented to person, place, and time. He exhibits normal muscle tone. Coordination normal.  Skin: Skin is warm and dry.    ED Course  Procedures (including critical care time)  Labs Reviewed - No data to display No results found.    Previous wrist x-ray reviewed by me.    MDM   Previous ED chart reviewed by me.  Pt wearing a sling and thumb spica splint.  Since pt is having lateral wrist pain , I will replace the thumb spica with a volar splint.  Possible injury of the TFCC or ligamentous injury   Patient has a triquetral fx on previous x-ray.  Pain to the wrist is lateral and worse with lateral deviation. Mild soft tissue swelling.  Patient has an appointment with Dr. Hilda Lias on Friday, 09/19/2012.  No focal neuro deficits on exam, no tenderness proximally.     Prescribed:  Percocet #12   Cloteal Isaacson L. [place], [place] 09/17/12 1958

## 2012-09-18 NOTE — ED Provider Notes (Signed)
Medical screening examination/treatment/procedure(s) were performed by non-physician practitioner and as supervising physician I was immediately available for consultation/collaboration.  John-Adam Oather Muilenburg, M.D.     John-Adam Batul Diego, MD 09/18/12 0917 

## 2012-09-19 NOTE — ED Provider Notes (Signed)
Medical screening examination/treatment/procedure(s) were performed by non-physician practitioner and as supervising physician I was immediately available for consultation/collaboration.   Shankar Silber W Prathik Aman, MD 09/19/12 0037 

## 2012-11-06 ENCOUNTER — Ambulatory Visit (HOSPITAL_COMMUNITY): Payer: Self-pay | Admitting: Psychiatry

## 2012-11-06 ENCOUNTER — Telehealth (HOSPITAL_COMMUNITY): Payer: Self-pay | Admitting: Psychiatry

## 2012-11-06 NOTE — Telephone Encounter (Signed)
Phone message completed in the phone message section.  

## 2012-11-25 ENCOUNTER — Ambulatory Visit (INDEPENDENT_AMBULATORY_CARE_PROVIDER_SITE_OTHER): Payer: MEDICAID | Admitting: Psychiatry

## 2012-11-25 ENCOUNTER — Encounter (HOSPITAL_COMMUNITY): Payer: Self-pay | Admitting: Psychiatry

## 2012-11-25 VITALS — Wt 198.4 lb

## 2012-11-25 DIAGNOSIS — F6381 Intermittent explosive disorder: Secondary | ICD-10-CM

## 2012-11-25 DIAGNOSIS — F902 Attention-deficit hyperactivity disorder, combined type: Secondary | ICD-10-CM

## 2012-11-25 DIAGNOSIS — F909 Attention-deficit hyperactivity disorder, unspecified type: Secondary | ICD-10-CM

## 2012-11-25 MED ORDER — AMPHETAMINE-DEXTROAMPHET ER 30 MG PO CP24: 30.0000 mg | ORAL_CAPSULE | ORAL | Status: DC

## 2012-11-25 NOTE — Progress Notes (Signed)
[facility] [address] Progress Note Joseph Blair MRN: [id number] DOB: [birth year] Age: 22 y.o.  Date: 11/25/2012 Start Time: 9:20 AM End Time: 9:34 AM  Chief Complaint: Chief Complaint  Patient presents with  . ADHD  . Follow-up  . Medication Refill  . Establish Care   Subjective: I am doing well, I am still working at [employer] and I am also taking 3 classes at [employer]. I'm taking a classes in welding, electrical and computers. I'm not having any side effects with the medication and there no safety concerns. Pt reports that he is compliant with the psychotropic medications with good benefit and no noticeable side effects.  Vitals: Wt 198 lb 6.4 oz (89.994 kg)  BMI 30.17 kg/m2  Mental Status Examination  Appearance: Casually dressed Alert: Yes Attention: fair  Cooperative: Yes Eye Contact: Good Speech: Normal in volume, rate, tone, spontaneous  Psychomotor Activity: Normal Memory/Concentration: OK Oriented: person, place and situation Mood: Euthymic Affect: Full Range Thought Processes and Associations: Goal Directed Fund of Knowledge: Fair Thought Content: Suicidal ideation, Homicidal ideation, Auditory hallucinations, Visual hallucinations, Delusions and Paranoia-none reported Insight: Good Judgement: Good  Lab Results: No results found for this or any previous visit (from the past 8736 hour(s)). PCP orders annual labs with no concerns reported.  Diagnosis: ADHD combined type, moderate severity, intermittent explosive disorder  Plan: I took his vitals.  I reviewed CC, tobacco/med/surg Hx, meds effects/ side effects, problem list, therapies and responses as well as current situation/symptoms discussed options. See orders and pt instructions for more details.  Medical Decision Making Problem Points:  Established problem, stable/improving (1), Review of last therapy session (1) and Review of psycho-social stressors (1) Data Points:  Review or order  clinical lab tests (1) Review of medication regiment & side effects (2)  I certify that outpatient services furnished can reasonably be expected to improve the patient's condition.   Orson Aloe, MD, [facility]

## 2012-11-25 NOTE — Patient Instructions (Signed)
Call if problems or concerns.  

## 2013-02-20 ENCOUNTER — Ambulatory Visit (INDEPENDENT_AMBULATORY_CARE_PROVIDER_SITE_OTHER): Payer: MEDICAID | Admitting: Psychiatry

## 2013-02-20 ENCOUNTER — Encounter (HOSPITAL_COMMUNITY): Payer: Self-pay | Admitting: Psychiatry

## 2013-02-20 VITALS — BP 138/86 | Ht 66.5 in | Wt 198.6 lb

## 2013-02-20 DIAGNOSIS — F909 Attention-deficit hyperactivity disorder, unspecified type: Secondary | ICD-10-CM

## 2013-02-20 DIAGNOSIS — F6381 Intermittent explosive disorder: Secondary | ICD-10-CM

## 2013-02-20 DIAGNOSIS — F902 Attention-deficit hyperactivity disorder, combined type: Secondary | ICD-10-CM

## 2013-02-20 MED ORDER — AMPHETAMINE-DEXTROAMPHET ER 30 MG PO CP24: 30.0000 mg | ORAL_CAPSULE | ORAL | Status: DC

## 2013-02-20 NOTE — Progress Notes (Signed)
[facility] [address] Progress Note Joseph Blair MRN: [id number] DOB: Jul 07, 1991 Age: 22 y.o.  Date: 02/20/2013 Start Time: 10:00 AM End Time: 10:15 AM  Chief Complaint: Chief Complaint  Patient presents with  . ADHD  . Follow-up  . Medication Refill   Subjective: I am doing well, I am noticing that the Adderall wears off early in the day. Could I take two a day? Grades at [employer]: 89 on a recent test. Pt reports that he is compliant with the psychotropic medications with good benefit and no noticeable side effects.  Discussed reducing the dose to make it BID and pt was reluctant to do that.  He chose to stay on the 30 mg dose.  Vitals: BP 138/86  Ht 5' 6.5" (1.689 m)  Wt 198 lb 9.6 oz (90.084 kg)  BMI 31.58 kg/m2 Allergies: Allergies  Allergen Reactions  . Bee Venom Swelling  Medical History: Past Medical History  Diagnosis Date  . ADHD (attention deficit hyperactivity disorder)   . GERD (gastroesophageal reflux disease)   Surgical History: Past Surgical History  Procedure Laterality Date  . Hernia repair    . Replacement total knee    Family History: family history includes ADD / ADHD in his brother and maternal aunt; Alcohol abuse in his maternal uncle; Drug abuse in his maternal uncle; and Schizophrenia in his maternal uncle.  There is no history of Anxiety disorder, and Bipolar disorder, and Dementia, and Depression, and OCD, and Paranoid behavior, and Seizures, and Sexual abuse, and Physical abuse, . Reviewed again in full today in the visit.  Mental Status Examination  Appearance: Casually dressed Alert: Yes Attention: fair  Cooperative: Yes Eye Contact: Good Speech: Normal in volume, rate, tone, spontaneous  Psychomotor Activity: Normal Memory/Concentration: OK Oriented: person, place and situation Mood: Euthymic Affect: Full Range Thought Processes and Associations: Goal Directed Fund of Knowledge: Fair Thought Content: Suicidal ideation,  Homicidal ideation, Auditory hallucinations, Visual hallucinations, Delusions and Paranoia-none reported Insight: Good Judgement: Good  Lab Results:  Results for orders placed during the hospital encounter of 05/09/12 (from the past 8736 hour(s))  URINALYSIS, ROUTINE W REFLEX MICROSCOPIC   Collection Time    05/09/12  5:09 PM      Result Value Range   Color, Urine YELLOW  YELLOW   APPearance CLEAR  CLEAR   Specific Gravity, Urine >1.030 (*) 1.005 - 1.030   pH 6.0  5.0 - 8.0   Glucose, UA NEGATIVE  NEGATIVE mg/dL   Hgb urine dipstick TRACE (*) NEGATIVE   Bilirubin Urine NEGATIVE  NEGATIVE   Ketones, ur NEGATIVE  NEGATIVE mg/dL   Protein, ur NEGATIVE  NEGATIVE mg/dL   Urobilinogen, UA 0.2  0.0 - 1.0 mg/dL   Nitrite NEGATIVE  NEGATIVE   Leukocytes, UA NEGATIVE  NEGATIVE  URINE MICROSCOPIC-ADD ON   Collection Time    05/09/12  5:09 PM      Result Value Range   Squamous Epithelial / LPF RARE  RARE   WBC, UA 0-2  <3 WBC/hpf   RBC / HPF 3-6  <3 RBC/hpf   Bacteria, UA RARE  RARE   PCP orders annual labs with no concerns reported.  Diagnosis: ADHD combined type, moderate severity, intermittent explosive disorder  Plan: I took his vitals.  I reviewed CC, tobacco/med/surg Hx, meds effects/ side effects, problem list, therapies and responses as well as current situation/symptoms discussed options. Continue current effective medications. See orders and pt instructions for more details. MEDICATIONS this encounter: Meds ordered  this encounter  Medications  . amphetamine-dextroamphetamine (ADDERALL XR) 30 MG 24 hr capsule    Sig: Take 1 capsule (30 mg total) by mouth every morning.    Dispense:  30 capsule    Refill:  0    DO not refill until 03/25/2013  . amphetamine-dextroamphetamine (ADDERALL XR) 30 MG 24 hr capsule    Sig: Take 1 capsule (30 mg total) by mouth every morning.    Dispense:  30 capsule    Refill:  0  . amphetamine-dextroamphetamine (ADDERALL XR) 30 MG 24 hr capsule     Sig: Take 1 capsule (30 mg total) by mouth every morning.    Dispense:  30 capsule    Refill:  0    Do not refill until 04/24/2013  Medical Decision Making Problem Points:  Established problem, stable/improving (1), Review of last therapy session (1) and Review of psycho-social stressors (1) Data Points:  Review or order clinical lab tests (1) Review of medication regiment & side effects (2)  I certify that outpatient services furnished can reasonably be expected to improve the patient's condition.   Orson Aloe, MD, [facility]

## 2013-02-20 NOTE — Patient Instructions (Addendum)
CUT BACK/CUT OUT on sugar and carbohydrates, that means very limited fruits and starchy vegetables and very limited grains, breads  The goal is low GLYCEMIC INDEX.  CUT OUT all wheat, rye, or barley for the GLUTEN in them.  HIGH fat and LOW carbohydrate diet is the KEY.  Eat avocados, eggs, lean meat like grass fed beef and chicken  Nuts and seeds would be good foods as well.   Stevia is an excellent sweetener.  Safe for the brain.   Truvia is also a good safe sweetener, not the baking blend form of Truvia  Almond butter is awesome.  Check out all this on the Internet.  Dr Purlmutter is on the Internet with some good info about this.   http://www.drperlmutter.com is where that is.  An excellent site for info on this diet is http://paleoleap.com  [employer] makes dark chocolate that is sweetened with Stevia that is safe.  Take care of yourself.  No one else is standing up to do the job and only you know what you need.   GET SERIOUS about taking care of yourself.  Do the next right thing and that often means doing something to care for yourself along the lines of are you hungry, are you angry, are you lonely, are you tired, are you scared?  [employer] is what that stands for.  Call if problems or concerns.  

## 2013-04-09 ENCOUNTER — Encounter (HOSPITAL_COMMUNITY): Payer: Self-pay | Admitting: *Deleted

## 2013-04-09 ENCOUNTER — Telehealth: Payer: Self-pay | Admitting: Orthopedic Surgery

## 2013-04-09 ENCOUNTER — Emergency Department (HOSPITAL_COMMUNITY)
Admission: EM | Admit: 2013-04-09 | Discharge: 2013-04-09 | Disposition: A | Discharge status: Home / Self Care | Payer: Medicaid Other | Attending: Emergency Medicine | Admitting: Emergency Medicine

## 2013-04-09 ENCOUNTER — Emergency Department (HOSPITAL_COMMUNITY): Payer: Medicaid Other

## 2013-04-09 DIAGNOSIS — W1789XA Other fall from one level to another, initial encounter: Secondary | ICD-10-CM | POA: Insufficient documentation

## 2013-04-09 DIAGNOSIS — Z8719 Personal history of other diseases of the digestive system: Secondary | ICD-10-CM | POA: Insufficient documentation

## 2013-04-09 DIAGNOSIS — Y9239 Other specified sports and athletic area as the place of occurrence of the external cause: Secondary | ICD-10-CM | POA: Insufficient documentation

## 2013-04-09 DIAGNOSIS — M25461 Effusion, right knee: Secondary | ICD-10-CM

## 2013-04-09 DIAGNOSIS — S99929A Unspecified injury of unspecified foot, initial encounter: Secondary | ICD-10-CM | POA: Insufficient documentation

## 2013-04-09 DIAGNOSIS — Y92838 Other recreation area as the place of occurrence of the external cause: Secondary | ICD-10-CM | POA: Insufficient documentation

## 2013-04-09 DIAGNOSIS — M25561 Pain in right knee: Secondary | ICD-10-CM

## 2013-04-09 DIAGNOSIS — F909 Attention-deficit hyperactivity disorder, unspecified type: Secondary | ICD-10-CM | POA: Insufficient documentation

## 2013-04-09 DIAGNOSIS — Y9367 Activity, basketball: Secondary | ICD-10-CM | POA: Insufficient documentation

## 2013-04-09 DIAGNOSIS — Z79899 Other long term (current) drug therapy: Secondary | ICD-10-CM | POA: Insufficient documentation

## 2013-04-09 DIAGNOSIS — S8990XA Unspecified injury of unspecified lower leg, initial encounter: Secondary | ICD-10-CM | POA: Insufficient documentation

## 2013-04-09 MED ORDER — OXYCODONE-ACETAMINOPHEN 5-325 MG PO TABS: 1.0000 | ORAL_TABLET | ORAL | Status: DC | PRN

## 2013-04-09 MED ORDER — NAPROXEN 250 MG PO TABS
500.0000 mg | ORAL_TABLET | Freq: Once | ORAL | Status: AC
  Administered 2013-04-09: 500 mg via ORAL
  Filled 2013-04-09: qty 2

## 2013-04-09 MED ORDER — OXYCODONE-ACETAMINOPHEN 5-325 MG PO TABS
1.0000 | ORAL_TABLET | Freq: Once | ORAL | Status: AC
  Administered 2013-04-09: 1 via ORAL
  Filled 2013-04-09: qty 1

## 2013-04-09 NOTE — Consult Note (Signed)
Reason for Consult:right knee pain Referring Physician: ER  Joseph Blair is an 22 y.o. male.  HPI: He fell and hurt his right knee two days ago while playing basketball at [facility].  He fell on concrete.  He had pain. It has swollen since then.  He has no open wound, no other injury.  He has no giving way.  He has no fever or chills.  Past Medical History  Diagnosis Date  . ADHD (attention deficit hyperactivity disorder)   . GERD (gastroesophageal reflux disease)     Past Surgical History  Procedure Laterality Date  . Hernia repair    . Replacement total knee      Family History  Problem Relation Age of Onset  . ADD / ADHD Brother   . ADD / ADHD Maternal Aunt   . Drug abuse Maternal Uncle   . Alcohol abuse Maternal Uncle   . Schizophrenia Maternal Uncle   . Anxiety disorder Neg Hx   . Bipolar disorder Neg Hx   . Dementia Neg Hx   . Depression Neg Hx   . OCD Neg Hx   . Paranoid behavior Neg Hx   . Seizures Neg Hx   . Sexual abuse Neg Hx   . Physical abuse Neg Hx     Social History:  reports that he has never smoked. He has never used smokeless tobacco. He reports that he does not drink alcohol or use illicit drugs.  Allergies:  Allergies  Allergen Reactions  . Bee Venom Swelling    Medications: I have reviewed the patient's current medications.  No results found for this or any previous visit (from the past 48 hour(s)).  Dg Knee Complete 4 Views Right  04/09/2013   *RADIOLOGY REPORT*  Clinical Data: Knee pain, twisting injury while playing basketball  RIGHT KNEE - COMPLETE 4+ VIEW  Comparison: None.  Findings: Four views of the knee demonstrate no acute fracture or malalignment.  There is a moderately large suprapatellar joint effusion.  Normal patellar positioning.  Normal bony mineralization.  No lytic or blastic osseous lesion.  IMPRESSION:  Large suprapatellar joint effusion without obvious fracture or malalignment.  If there is clinical concern for  internal derangement, recommend MRI of the knee following resolution of the acute swelling.   Original Report Authenticated By: Malachy Moan, M.D.    Review of Systems  Musculoskeletal: Positive for joint pain (Knee pain from two days ago playing basketball at [facility] on concrete pad.  He fell and hit right knee.  It has effusion and pain.  He has no open wound.  He has no other injury.).  All other systems reviewed and are negative.   Blood pressure 131/95, pulse 87, temperature 98.4 F (36.9 C), temperature source Oral, resp. rate 16, height 5\' 7"  (1.702 m), weight 84.369 kg (186 lb), SpO2 99.00%. Physical Exam  Constitutional: He is oriented to person, place, and time. He appears well-developed and well-nourished.  HENT:  Head: Normocephalic and atraumatic.  Eyes: Conjunctivae and EOM are normal. Pupils are equal, round, and reactive to light.  Neck: Normal range of motion. Neck supple.  Cardiovascular: Normal rate, regular rhythm and intact distal pulses.   Respiratory: Effort normal and breath sounds normal.  GI: Soft.  Musculoskeletal: He exhibits tenderness (Pain right knee with moderate effusion.  ROM 5 to 100 with pain.  Knee stable.  He has no redness, he has no wound.  I aspirated after sterile prep 30 cc of bloody fluid  from the knee.  Tolerated well.).  Neurological: He is alert and oriented to person, place, and time. He has normal reflexes.  Skin: Skin is warm and dry.  Psychiatric: He has a normal mood and affect. His behavior is normal. Judgment and thought content normal.    Assessment/Plan: Traumatic effusion of the right knee.  See in my office in morning.  Use crutches and knee immobilizer.  Shaolin Armas 04/09/2013, 7:57 PM

## 2013-04-09 NOTE — Telephone Encounter (Signed)
Patient called to request appointment for knee injury; states was seen at Emergency Room (?[facility]) in [place], [place].  Advised patient about requesting medical records, Xray films, and Xray reports, as well as the appropriate referral from primary care physician, per his [employer] requirement.  Patient also relates that he has a history of ACL surgery on this same knee, performed at [facility] by Dr. Edger House, who is no longer in the area.  Advised patient about requesting operative report, Xray film, Xray report from [place], All of which would need to be reviewed by Dr. Romeo Apple prior to scheduling appointment.

## 2013-04-09 NOTE — ED Provider Notes (Signed)
Medical screening examination/treatment/procedure(s) were performed by non-physician practitioner and as supervising physician I was immediately available for consultation/collaboration. Devoria Albe, MD, Armando Gang   Ward Givens, MD 04/09/13 [phone]

## 2013-04-09 NOTE — ED Notes (Signed)
Pain rt knee with swelling, injury with playing basketball 2 days ago

## 2013-04-09 NOTE — ED Notes (Signed)
Rt knee painful and swollen.

## 2013-04-09 NOTE — ED Provider Notes (Signed)
History    CSN: [id number] Arrival date & time 04/09/13  4098  First MD Initiated Contact with Patient 04/09/13 1842     Chief Complaint  Patient presents with  . Knee Pain   (Consider location/radiation/quality/duration/timing/severity/associated sxs/prior Treatment) HPI Comments: Patient complains of right knee pain and swelling for 2 days. Pain began after a fall while playing basketball. He states that he he jumped he landed on the right knee and felt a" pop" and has pain with attempted flexion and extension of the knee. He is applied ice without relief. He describes a sharp pain that is constant to the knee. He states the pain feels similar to a anterior cruciate ligament and meniscal injury to the left knee that occurred 5 years ago. He denies distal numbness, redness, or fever  Patient is a 22 y.o. male presenting with knee pain. The history is provided by the patient.  Knee Pain Location:  Knee Time since incident:  2 days Injury: yes   Mechanism of injury: fall   Fall:    Fall occurred:  Recreating/playing   Impact surface:  Primary school teacher of impact:  Feet   Entrapped after fall: no   Knee location:  R knee Pain details:    Quality:  Aching   Radiates to:  Does not radiate   Severity:  Moderate   Onset quality:  Sudden   Timing:  Constant   Progression:  Unchanged Chronicity:  New Dislocation: no   Foreign body present:  No foreign bodies Prior injury to area:  No Relieved by:  Nothing Worsened by:  Activity and bearing weight Ineffective treatments:  Ice Associated symptoms: swelling   Associated symptoms: no back pain, no decreased ROM, no fatigue, no fever, no muscle weakness, no neck pain, no stiffness and no tingling    Past Medical History  Diagnosis Date  . ADHD (attention deficit hyperactivity disorder)   . GERD (gastroesophageal reflux disease)    Past Surgical History  Procedure Laterality Date  . Hernia repair    . Replacement total knee      Family History  Problem Relation Age of Onset  . ADD / ADHD Brother   . ADD / ADHD Maternal Aunt   . Drug abuse Maternal Uncle   . Alcohol abuse Maternal Uncle   . Schizophrenia Maternal Uncle   . Anxiety disorder Neg Hx   . Bipolar disorder Neg Hx   . Dementia Neg Hx   . Depression Neg Hx   . OCD Neg Hx   . Paranoid behavior Neg Hx   . Seizures Neg Hx   . Sexual abuse Neg Hx   . Physical abuse Neg Hx    History  Substance Use Topics  . Smoking status: Never Smoker   . Smokeless tobacco: Never Used  . Alcohol Use: No    Review of Systems  Constitutional: Negative for fever and fatigue.  HENT: Negative for neck pain.   Musculoskeletal: Positive for joint swelling and arthralgias. Negative for back pain and stiffness.  Skin: Negative for color change and wound.  All other systems reviewed and are negative.    Allergies  Bee venom  Home Medications   Current Outpatient Rx  Name  Route  Sig  Dispense  Refill  . amphetamine-dextroamphetamine (ADDERALL XR) 30 MG 24 hr capsule   Oral   Take 1 capsule (30 mg total) by mouth every morning.   30 capsule   0     DO  not refill until 03/25/2013    BP 131/95  Pulse 87  Temp(Src) 98.4 F (36.9 C) (Oral)  Resp 16  Ht 5\' 7"  (1.702 m)  Wt 186 lb (84.369 kg)  BMI 29.12 kg/m2  SpO2 99% Physical Exam  Nursing note and vitals reviewed. Constitutional: He is oriented to person, place, and time. He appears well-developed and well-nourished. No distress.  HENT:  Head: Normocephalic and atraumatic.  Cardiovascular: Normal rate, regular rhythm, normal heart sounds and intact distal pulses.   No murmur heard. Pulmonary/Chest: Effort normal and breath sounds normal. No respiratory distress.  Musculoskeletal: He exhibits edema and tenderness.  ttp of the anterior and medial right knee. Moderate effusion, pain reproduced with full extension and partial flexion.   No erythema, bruising or step off deformity.  No distal  tenderness. DP pulse brisk, distal sensation intact  Neurological: He is alert and oriented to person, place, and time. He exhibits normal muscle tone. Coordination normal.  Skin: Skin is warm and dry. No erythema.    ED Course  Procedures (including critical care time) Labs Reviewed - No data to display Dg Knee Complete 4 Views Right  04/09/2013   *RADIOLOGY REPORT*  Clinical Data: Knee pain, twisting injury while playing basketball  RIGHT KNEE - COMPLETE 4+ VIEW  Comparison: None.  Findings: Four views of the knee demonstrate no acute fracture or malalignment.  There is a moderately large suprapatellar joint effusion.  Normal patellar positioning.  Normal bony mineralization.  No lytic or blastic osseous lesion.  IMPRESSION:  Large suprapatellar joint effusion without obvious fracture or malalignment.  If there is clinical concern for internal derangement, recommend MRI of the knee following resolution of the acute swelling.   Original Report Authenticated By: Malachy Moan, M.D.     MDM   Medial ttp and palpable effusion to right knee.  No excessive warmth, erythema or bony deformity on exam.  NV intact.  Exam is concerning for possible internal derangement.   1925  Consulted Dr. Hilda Lias.  Will come to ED to further evaluate the patient and perform needle aspiration.  Patient to be discharged, feels better.  Agrees to f/u with Dr. Hilda Lias in his office tomorrow.  Knee immobilizer applied, crutches given.  Remains NV intact  Ebelin Dillehay L. Trisha Mangle, PA-C 04/09/13 2009

## 2013-04-09 NOTE — ED Notes (Signed)
Playing basketball x 2 days ago and jumped up, when came back down, heard a "pop" in knee.  C/o pain right knee.

## 2013-04-09 NOTE — ED Notes (Signed)
Dr Hilda Lias here to see pt and aspirate blood from rt knee

## 2013-04-20 ENCOUNTER — Other Ambulatory Visit (HOSPITAL_COMMUNITY): Payer: Self-pay | Admitting: Orthopaedic Surgery

## 2013-04-20 DIAGNOSIS — M25561 Pain in right knee: Secondary | ICD-10-CM

## 2013-04-21 ENCOUNTER — Ambulatory Visit (HOSPITAL_COMMUNITY)
Admission: RE | Admit: 2013-04-21 | Discharge: 2013-04-21 | Disposition: A | Discharge status: Home / Self Care | Payer: Medicaid Other | Source: Ambulatory Visit | Attending: Orthopaedic Surgery | Admitting: Orthopaedic Surgery

## 2013-04-21 DIAGNOSIS — M25569 Pain in unspecified knee: Secondary | ICD-10-CM | POA: Insufficient documentation

## 2013-04-21 DIAGNOSIS — Y9367 Activity, basketball: Secondary | ICD-10-CM | POA: Insufficient documentation

## 2013-04-21 DIAGNOSIS — S83509A Sprain of unspecified cruciate ligament of unspecified knee, initial encounter: Secondary | ICD-10-CM | POA: Insufficient documentation

## 2013-04-21 DIAGNOSIS — M25561 Pain in right knee: Secondary | ICD-10-CM

## 2013-04-21 DIAGNOSIS — X500XXA Overexertion from strenuous movement or load, initial encounter: Secondary | ICD-10-CM | POA: Insufficient documentation

## 2013-04-27 NOTE — Telephone Encounter (Signed)
04/27/13 No response received back from patient; however, call received from [facility] at Dr. Sanjuan Dame office, who relayed that they had seen patient in their office, and will need to refer him to Dr. Romeo Apple, due to ACL-related problem. They have faxed reports ( * Notes and Xray report in [name] box for review. )  Dr. Romeo Apple to advise.

## 2013-04-27 NOTE — Telephone Encounter (Signed)
Patient called to request appointment for knee injury; states was seen at Emergency Room (?[facility]) in [place], [place]. Advised patient about requesting medical records, Xray films, and Xray reports, as well as the appropriate referral from primary care physician, per his [employer] requirement. Patient also relates that he has a history of ACL surgery on this same knee, performed at [facility] by Dr. Edger House, who is no longer in the area. Advised patient about requesting operative report, Xray film, Xray report from [facility],  All of which would need to be reviewed by Dr. Romeo Apple prior to scheduling appointment.   You already know what we need  When he gets the info and i review we can see him   ??? Why are we asking?? What to do

## 2013-04-28 NOTE — Telephone Encounter (Signed)
Patient came in to our office in response to message left, regarding appointment.  Reviewed the need for all medical record information and films as noted.     Patient signed releases for both [facility] [phone] [phone] Fax [phone] / radiology department ph [phone], fax [phone]) - Faxed request 04/28/13  and release for [facility], ph [phone], fax [phone] - Faxed request 04/28/13.  Patient, and Dr. Sanjuan Dame office, aware of status.

## 2013-05-05 ENCOUNTER — Encounter: Payer: Self-pay | Admitting: Orthopedic Surgery

## 2013-05-05 ENCOUNTER — Ambulatory Visit (INDEPENDENT_AMBULATORY_CARE_PROVIDER_SITE_OTHER): Payer: Medicaid Other | Admitting: Orthopedic Surgery

## 2013-05-05 VITALS — BP 120/78 | Ht 67.0 in | Wt 194.0 lb

## 2013-05-05 DIAGNOSIS — S83511A Sprain of anterior cruciate ligament of right knee, initial encounter: Secondary | ICD-10-CM

## 2013-05-05 DIAGNOSIS — S83509A Sprain of unspecified cruciate ligament of unspecified knee, initial encounter: Secondary | ICD-10-CM

## 2013-05-05 DIAGNOSIS — S83519A Sprain of anterior cruciate ligament of unspecified knee, initial encounter: Secondary | ICD-10-CM | POA: Insufficient documentation

## 2013-05-05 NOTE — Progress Notes (Signed)
  Subjective:    Patient ID: Joseph Blair, male    DOB: 03-18-1991, 22 y.o.   MRN: [id number]  Knee Pain  The incident occurred more than 1 week ago (June 23). [place] Injury mechanism: Injured playing basketball. The pain is present in the right knee. The quality of the pain is described as aching. The pain is at a severity of 8/10. The pain has been constant since onset. Associated symptoms include a loss of motion. Associated symptoms comments: Locking.      Review of Systems  Gastrointestinal:       Heartburn  All other systems reviewed and are negative.       Objective:   Physical Exam The following portions of the patient's history were reviewed and updated as appropriate: allergies, current medications, past family history, past medical history, past social history, past surgical history and problem list.   Review of Systems As noted   Objective:           Vital signs are stable as recorded, BP 120/78  Ht 5\' 7"  (1.702 m)  Wt 194 lb (87.998 kg)  BMI 30.38 kg/m2  General appearance is normal  The patient is alert and oriented x3  The patient's mood and affect are normal  Gait assessment: Ambulatory no assistive devices no noticeable limping The cardiovascular exam reveals normal pulses and temperature without edema swelling.  The lymphatic system is negative for palpable lymph nodes  The sensory exam is normal.  There are no pathologic reflexes.  Balance is normal.  Left knee exam shows a stable Lachman but a slightly positive pivot shift trace from previous anterior cruciate ligament surgery. Flexion ARC is normal muscle tone and strength are normal collateral ligaments are stable no joint effusion no tenderness Right Knee exam Small joint effusion is noted. Flexion is limited @ 120. Medial and Lateral ligaments are stable. The cruciate ligament appears to be torn with a positive Lachman test and a positive anterior drawer.  He would not relax enough to allow pivot shift test. Joint line tenderness medially. Strength and muscle tone remains normal. Overall alignment is normal.  Upper extremity exam  The right and left upper extremity:   Inspection and palpation revealed no abnormalities in the upper extremities.   Range of motion is full without contracture.  Motor exam is normal with grade 5 strength.  The joints are fully reduced without subluxation.  There is no atrophy or tremor and muscle tone is normal.  All joints are stable   X-ray right knee: no fracture, dislocation, swelling or degenerative changes noted   MRI right knee shows complete anterior cruciate ligament tear with bone contusions consistent with torn anterior cruciate ligament and acute injury. No meniscal damage was noted. Assessment:    Right Anterior cruciate ligament tear    Plan:    This 22 year old male in need an anterior cruciate ligament reconstruction of his right knee. However he is a Medicaid patient and is only allowed 3 postoperative physical therapy visits. Inability to complete a postoperative rehabilitation program is a contraindication to surgery. Therefore I will talk with the hospital to see if they will do his therapy visits if not will not bill to perform his surgery.         Assessment & Plan:  See above

## 2013-05-05 NOTE — Patient Instructions (Addendum)
ACL Surgery Needed will talk with PT to check on therapy visits will call patient back.

## 2013-05-15 ENCOUNTER — Telehealth: Payer: Self-pay | Admitting: *Deleted

## 2013-05-15 NOTE — Telephone Encounter (Signed)
Patient called on Friday asking about refill for pain medicine.States that Dr. Hilda Lias would not refill since patient came to see you. I advised him that you were not in on Fridays to refill medicine, but he could have pharmacy send refill request. Also, according to Beth's response to Carol's e mail regarding therapy,  Medicaid will only cover 1 evaluation and 3 treatments.According to your office note and whatI  you had told the patient he could not have the surgery based on this.Patient is wanting to know what to do. Please advise.

## 2013-05-18 ENCOUNTER — Other Ambulatory Visit: Payer: Self-pay | Admitting: *Deleted

## 2013-05-18 DIAGNOSIS — S83511A Sprain of anterior cruciate ligament of right knee, initial encounter: Secondary | ICD-10-CM

## 2013-05-18 MED ORDER — TRAMADOL-ACETAMINOPHEN 37.5-325 MG PO TABS: 1.0000 | ORAL_TABLET | ORAL | Status: DC | PRN

## 2013-05-18 NOTE — Telephone Encounter (Signed)
Called patient and informed him of DR. [name]'s response. Faxed prescription to [facility].

## 2013-05-18 NOTE — Telephone Encounter (Signed)
ULTRACET 1 Q 4 PRN PAIN # [phone]  5 REFILLS   HE IS ON HIS OWN   NO SURGERY WITHOUT APPROPRIATE REHAB  CALL HIS CONGRESSMAN

## 2013-05-25 ENCOUNTER — Encounter (HOSPITAL_COMMUNITY): Payer: Self-pay | Admitting: Psychiatry

## 2013-05-25 ENCOUNTER — Ambulatory Visit (INDEPENDENT_AMBULATORY_CARE_PROVIDER_SITE_OTHER): Payer: 59 | Admitting: Psychiatry

## 2013-05-25 ENCOUNTER — Ambulatory Visit (HOSPITAL_COMMUNITY): Payer: Self-pay | Admitting: Psychiatry

## 2013-05-25 VITALS — BP 120/94 | Ht 66.0 in | Wt 196.0 lb

## 2013-05-25 DIAGNOSIS — F6381 Intermittent explosive disorder: Secondary | ICD-10-CM

## 2013-05-25 DIAGNOSIS — F909 Attention-deficit hyperactivity disorder, unspecified type: Secondary | ICD-10-CM

## 2013-05-25 DIAGNOSIS — F902 Attention-deficit hyperactivity disorder, combined type: Secondary | ICD-10-CM

## 2013-05-25 MED ORDER — AMPHETAMINE-DEXTROAMPHET ER 30 MG PO CP24: 30.0000 mg | ORAL_CAPSULE | ORAL | Status: DC

## 2013-05-25 NOTE — Progress Notes (Signed)
[facility] [address] Progress Note JAMAURI KRUZEL MRN: [id number] DOB: 11-04-1990 Age: 22 y.o.  Date: 05/25/2013 Start Time: 10:00 AM End Time: 10:15 AM  Chief Complaint: ADHD   Subjective: I am doing well.  Pt reports that he is compliant with the psychotropic medications with good benefit and no noticeable side effects. 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  Vitals BP 120/94, 196 lbs, 5'6"  Allergies: Allergies  Allergen Reactions  . Bee Venom Swelling  Medical History: Past Medical History  Diagnosis Date  . ADHD (attention deficit hyperactivity disorder)   . GERD (gastroesophageal reflux disease)   Surgical History: Past Surgical History  Procedure Laterality Date  . Hernia repair    . Anterior cruciate ligament repair      Left knee Dr. Edger House  Family History: family history includes ADD / ADHD in his brother and maternal aunt; Alcohol abuse in his maternal uncle; Drug abuse in his maternal uncle; and Schizophrenia in his maternal uncle.  There is no history of Anxiety disorder, and Bipolar disorder, and Dementia, and Depression, and OCD, and Paranoid behavior, and Seizures, and Sexual abuse, and Physical abuse, . Reviewed again in full today in the visit.  Mental Status Examination  Appearance: Casually dressed Alert: Yes Attention: fair  Cooperative: Yes Eye Contact: Good Speech: Normal in volume, rate, tone, spontaneous  Psychomotor Activity: Normal Memory/Concentration: OK Oriented: person, place and situation Mood: Euthymic Affect: Full Range Thought Processes and Associations: Goal Directed Fund of Knowledge: Fair Thought Content: Suicidal ideation, Homicidal ideation, Auditory hallucinations, Visual hallucinations, Delusions and Paranoia-none reported Insight: Good Judgement: Good  Lab Results:  No results found for this or any previous visit (from the past 8736 hour(s)). PCP orders annual labs with no concerns  reported.  Diagnosis: ADHD combined type, moderate severity, intermittent explosive disorder  Plan: I took his vitals.  I reviewed CC, tobacco/med/surg Hx, meds effects/ side effects, problem list, therapies and responses as well as current situation/symptoms discussed options. Continue current effective medications. See orders and pt instructions for more details. MEDICATIONS this encounter:AdderallXR 30 mg QAM 3 months given No orders of the defined types were placed in this encounter.  Medical Decision Making Problem Points:  Established problem, stable/improving (1), Review of last therapy session (1) and Review of psycho-social stressors (1) Data Points:  Review or order clinical lab tests (1) Review of medication regiment & side effects (2)  I certify that outpatient services furnished can reasonably be expected to improve the patient's condition.   Diannia Ruder, MD

## 2013-08-25 ENCOUNTER — Ambulatory Visit (INDEPENDENT_AMBULATORY_CARE_PROVIDER_SITE_OTHER): Payer: 59 | Admitting: Psychiatry

## 2013-08-25 ENCOUNTER — Encounter (HOSPITAL_COMMUNITY): Payer: Self-pay | Admitting: Psychiatry

## 2013-08-25 VITALS — BP 140/80 | Ht 66.0 in | Wt 191.0 lb

## 2013-08-25 DIAGNOSIS — F902 Attention-deficit hyperactivity disorder, combined type: Secondary | ICD-10-CM

## 2013-08-25 DIAGNOSIS — F909 Attention-deficit hyperactivity disorder, unspecified type: Secondary | ICD-10-CM

## 2013-08-25 DIAGNOSIS — F6381 Intermittent explosive disorder: Secondary | ICD-10-CM

## 2013-08-25 MED ORDER — AMPHETAMINE-DEXTROAMPHET ER 30 MG PO CP24: 30.0000 mg | ORAL_CAPSULE | ORAL | Status: DC

## 2013-08-25 NOTE — Progress Notes (Signed)
Patient ID: DRAE MITZEL, male   DOB: 1991/06/01, 22 y.o.   MRN: [id number] [facility] [address] Progress Note KAISEN ACKERS MRN: [id number] DOB: 09-Aug-1991 Age: 22 y.o.  Date: 08/25/2013 Start Time: 10:00 AM End Time: 10:15 AM  Chief Complaint: ADHD   Subjective: I am doing well.  Patient is a 22 year old married white male lives with his wife. They're expecting a baby in February. 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 Pt reports that he is compliant with the psychotropic medications with good benefit and no noticeable side effects. He is staying well focused. His temper is under good control. He is sleeping well. He is still having knee pain this time in the right knee and may have to have surgery.   Vitals BP 140/80 191 lbs, 5'6"  Allergies: Allergies  Allergen Reactions  . Bee Venom Swelling  Medical History: Past Medical History  Diagnosis Date  . ADHD (attention deficit hyperactivity disorder)   . GERD (gastroesophageal reflux disease)   . 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 Hernia repair    . Anterior cruciate ligament repair      Left knee Dr. Edger House  Family History: family history includes ADD / ADHD in his brother and maternal aunt; Alcohol abuse in his maternal uncle; Drug abuse in his maternal uncle; Schizophrenia in his maternal uncle. There is no history of Anxiety disorder, Bipolar disorder, Dementia, Depression, OCD, Paranoid behavior, Seizures, Sexual abuse, or Physical abuse. Reviewed again in full today in the visit.  Mental Status Examination  Appearance: Casually dressed Alert: Yes Attention: fair  Cooperative: Yes Eye Contact: Good Speech: Normal in volume, rate, tone, spontaneous  Psychomotor Activity: Normal Memory/Concentration: OK Oriented: person, place and situation Mood:  Euthymic Affect: Full Range Thought Processes and Associations: Goal Directed Fund of Knowledge: Fair Thought Content: Suicidal ideation, Homicidal ideation, Auditory hallucinations, Visual hallucinations, Delusions and Paranoia-none reported Insight: Good Judgement: Good  Lab Results:  No results found for this or any previous visit (from the past 8736 hour(s)). PCP orders annual labs with no concerns reported.  Diagnosis: ADHD combined type, moderate severity, intermittent explosive disorder  Plan: I took his vitals.  I reviewed CC, tobacco/med/surg Hx, meds effects/ side effects, problem list, therapies and responses as well as current situation/symptoms discussed options. Continue current effective medications. See orders and pt instructions for more details. MEDICATIONS this encounter:AdderallXR 30 mg QAM 3 months given No orders of the defined types were placed in this encounter.  Medical Decision Making Problem Points:  Established problem, stable/improving (1), Review of last therapy session (1) and Review of psycho-social stressors (1) Data Points:  Review or order clinical lab tests (1) Review of medication regiment & side effects (2)  I certify that outpatient services furnished can reasonably be expected to improve the patient's condition.   Diannia Ruder, MD

## 2013-11-25 ENCOUNTER — Ambulatory Visit (INDEPENDENT_AMBULATORY_CARE_PROVIDER_SITE_OTHER): Payer: 59 | Admitting: Psychiatry

## 2013-11-25 ENCOUNTER — Encounter (HOSPITAL_COMMUNITY): Payer: Self-pay | Admitting: Psychiatry

## 2013-11-25 VITALS — BP 140/90 | Ht 66.0 in | Wt 190.8 lb

## 2013-11-25 DIAGNOSIS — F902 Attention-deficit hyperactivity disorder, combined type: Secondary | ICD-10-CM

## 2013-11-25 DIAGNOSIS — F909 Attention-deficit hyperactivity disorder, unspecified type: Secondary | ICD-10-CM

## 2013-11-25 MED ORDER — AMPHETAMINE-DEXTROAMPHET ER 30 MG PO CP24: 30.0000 mg | ORAL_CAPSULE | ORAL | Status: DC

## 2013-11-25 NOTE — Progress Notes (Signed)
Patient ID: Joseph Blair, male   DOB: Jul 14, 1991, 23 y.o.   MRN: [id number] Patient ID: Joseph Blair, male   DOB: Jul 14, 1991, 23 y.o.   MRN: [id number] [facility] [id number] Progress Note Joseph Blair MRN: [id number] DOB: Jul 14, 1991 Age: 23 y.o.  Date: 11/25/2013 Start Time: 10:00 AM End Time: 10:15 AM  Chief Complaint: ADHD   Subjective: I am doing well.  Patient is a 23 year old married white male lives with his wife. Their new baby son was born 5 days ago He works in heating and air and is also [facility] [employer] in this subject. Pt reports that he is compliant with the psychotropic medications with good benefit and no noticeable side effects. He is staying well focused. His temper is under good control. He is sleeping well. He is still having knee pain this time in the right knee and may have to have surgery. His blood pressures running a bit high today and I told him this needed to be addressed with his primary Dr.   Theodoro KosVitals BP 140/90 191 lbs, 5'6"  Allergies: Allergies  Allergen Reactions  . Bee Venom Swelling  Medical History: Past Medical History  Diagnosis Date  . ADHD (attention deficit hyperactivity disorder)   . GERD (gastroesophageal reflux disease)   . 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 Hernia repair    . Anterior cruciate ligament repair      Left knee Dr. Edger HouseMcGinley  Family History: family history includes ADD / ADHD in his brother and maternal aunt; Alcohol abuse in his maternal uncle; Drug abuse in his maternal uncle; Schizophrenia in his maternal uncle. There is no history of Anxiety disorder, Bipolar disorder, Dementia, Depression, OCD, Paranoid behavior, Seizures, Sexual abuse, or Physical abuse. Reviewed again in full today in the visit.  Mental Status Examination  Appearance: Casually dressed Alert: Yes Attention: fair   Cooperative: Yes Eye Contact: Good Speech: Normal in volume, rate, tone, spontaneous  Psychomotor Activity: Normal Memory/Concentration: OK Oriented: person, place and situation Mood: Euthymic Affect: Full Range Thought Processes and Associations: Goal Directed Fund of Knowledge: Fair Thought Content: Suicidal ideation, Homicidal ideation, Auditory hallucinations, Visual hallucinations, Delusions and Paranoia-none reported Insight: Good Judgement: Good  Lab Results:  No results found for this or any previous visit (from the past 8736 hour(s)). PCP orders annual labs with no concerns reported.  Diagnosis: ADHD combined type, moderate severity, intermittent explosive disorder  Plan: I took his vitals.  I reviewed CC, tobacco/med/surg Hx, meds effects/ side effects, problem list, therapies and responses as well as current situation/symptoms discussed options. Continue current effective medications. See orders and pt instructions for more details. MEDICATIONS this encounter:AdderallXR 30 mg QAM 3 months given   Medical Decision Making Problem Points:  Established problem, stable/improving (1), Review of last therapy session (1) and Review of psycho-social stressors (1) Data Points:  Review or order clinical lab tests (1) Review of medication regiment & side effects (2)  I certify that outpatient services furnished can reasonably be expected to improve the patient's condition.   Diannia RuderOSS, DEBORAH, MD

## 2014-02-22 ENCOUNTER — Ambulatory Visit (HOSPITAL_COMMUNITY): Payer: Self-pay | Admitting: Psychiatry

## 2014-02-23 ENCOUNTER — Ambulatory Visit (INDEPENDENT_AMBULATORY_CARE_PROVIDER_SITE_OTHER): Payer: 59 | Admitting: Psychiatry

## 2014-02-23 ENCOUNTER — Encounter (HOSPITAL_COMMUNITY): Payer: Self-pay | Admitting: Psychiatry

## 2014-02-23 VITALS — BP 130/90 | Ht 66.0 in | Wt 203.0 lb

## 2014-02-23 DIAGNOSIS — F909 Attention-deficit hyperactivity disorder, unspecified type: Secondary | ICD-10-CM

## 2014-02-23 DIAGNOSIS — F902 Attention-deficit hyperactivity disorder, combined type: Secondary | ICD-10-CM

## 2014-02-23 DIAGNOSIS — F6381 Intermittent explosive disorder: Secondary | ICD-10-CM

## 2014-02-23 MED ORDER — AMPHETAMINE-DEXTROAMPHET ER 30 MG PO CP24: 30.0000 mg | ORAL_CAPSULE | ORAL | Status: DC

## 2014-02-23 NOTE — Progress Notes (Signed)
Patient ID: Joseph HillockCharlie D Tholl, male   DOB: 12-17-1990, 23 y.o.   MRN: [id number] Patient ID: Joseph Blair, male   DOB: 12-17-1990, 23 y.o.   MRN: [id number] Patient ID: Joseph HillockCharlie D Stirewalt, male   DOB: 12-17-1990, 23 y.o.   MRN: [id number] [facility] [id number] Progress Note Joseph HillockCharlie D Rissler MRN: [id number] DOB: 12-17-1990 Age: 23 y.o.  Date: 02/23/2014 Start Time: 10:00 AM End Time: 10:15 AM  Chief Complaint: ADHD   Subjective: I am doing well.  Patient is a 23 year old married white male lives with his wife.and baby son . He now works at [employer] Pt reports that he is compliant with the psychotropic medications with good benefit and no noticeable side effects. He is staying well focused. His temper is under good control. He is sleeping well.  His blood pressures running a bit high today and I told him this needed to be addressed with his primary Dr. I told him this last time as well   Vitals BP 140/90 191 lbs, 5'6"  Allergies: Allergies  Allergen Reactions  . Bee Venom Swelling  Medical History: Past Medical History  Diagnosis Date  . ADHD (attention deficit hyperactivity disorder)   . GERD (gastroesophageal reflux disease)   . 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 Hernia repair    . Anterior cruciate ligament repair      Left knee Dr. Edger HouseMcGinley  Family History: family history includes ADD / ADHD in his brother and maternal aunt; Alcohol abuse in his maternal uncle; Drug abuse in his maternal uncle; Schizophrenia in his maternal uncle. There is no history of Anxiety disorder, Bipolar disorder, Dementia, Depression, OCD, Paranoid behavior, Seizures, Sexual abuse, or Physical abuse. Reviewed again in full today in the visit.  Mental Status Examination  Appearance: Casually dressed Alert: Yes Attention: fair  Cooperative: Yes Eye Contact: Good Speech: Normal in  volume, rate, tone, spontaneous  Psychomotor Activity: Normal Memory/Concentration: OK Oriented: person, place and situation Mood: Euthymic Affect: Full Range Thought Processes and Associations: Goal Directed Fund of Knowledge: Fair Thought Content: Suicidal ideation, Homicidal ideation, Auditory hallucinations, Visual hallucinations, Delusions and Paranoia-none reported Insight: Good Judgement: Good  Lab Results:  No results found for this or any previous visit (from the past 8736 hour(s)). PCP orders annual labs with no concerns reported.  Diagnosis: ADHD combined type, moderate severity, intermittent explosive disorder  Plan: I took his vitals.  I reviewed CC, tobacco/med/surg Hx, meds effects/ side effects, problem list, therapies and responses as well as current situation/symptoms discussed options. Continue current effective medications. See orders and pt instructions for more details. MEDICATIONS this encounter:AdderallXR 30 mg QAM 3 months given   Medical Decision Making Problem Points:  Established problem, stable/improving (1), Review of last therapy session (1) and Review of psycho-social stressors (1) Data Points:  Review or order clinical lab tests (1) Review of medication regiment & side effects (2)  I certify that outpatient services furnished can reasonably be expected to improve the patient's condition.   Diannia Rudereborah Terald Jump, MD

## 2014-03-02 ENCOUNTER — Emergency Department (HOSPITAL_COMMUNITY): Payer: Medicaid Other

## 2014-03-02 ENCOUNTER — Encounter (HOSPITAL_COMMUNITY): Payer: Self-pay | Admitting: Emergency Medicine

## 2014-03-02 ENCOUNTER — Emergency Department (HOSPITAL_COMMUNITY)
Admission: EM | Admit: 2014-03-02 | Discharge: 2014-03-03 | Disposition: A | Discharge status: Home / Self Care | Payer: Medicaid Other | Attending: Emergency Medicine | Admitting: Emergency Medicine

## 2014-03-02 DIAGNOSIS — R071 Chest pain on breathing: Secondary | ICD-10-CM | POA: Insufficient documentation

## 2014-03-02 DIAGNOSIS — T675XXA Heat exhaustion, unspecified, initial encounter: Secondary | ICD-10-CM | POA: Insufficient documentation

## 2014-03-02 DIAGNOSIS — F909 Attention-deficit hyperactivity disorder, unspecified type: Secondary | ICD-10-CM | POA: Insufficient documentation

## 2014-03-02 DIAGNOSIS — X30XXXA Exposure to excessive natural heat, initial encounter: Secondary | ICD-10-CM | POA: Insufficient documentation

## 2014-03-02 DIAGNOSIS — R519 Headache, unspecified: Secondary | ICD-10-CM

## 2014-03-02 DIAGNOSIS — H53149 Visual discomfort, unspecified: Secondary | ICD-10-CM | POA: Insufficient documentation

## 2014-03-02 DIAGNOSIS — Y990 Civilian activity done for income or pay: Secondary | ICD-10-CM | POA: Insufficient documentation

## 2014-03-02 DIAGNOSIS — R0602 Shortness of breath: Secondary | ICD-10-CM | POA: Insufficient documentation

## 2014-03-02 DIAGNOSIS — R0789 Other chest pain: Secondary | ICD-10-CM

## 2014-03-02 DIAGNOSIS — Y9289 Other specified places as the place of occurrence of the external cause: Secondary | ICD-10-CM | POA: Insufficient documentation

## 2014-03-02 DIAGNOSIS — Y9389 Activity, other specified: Secondary | ICD-10-CM | POA: Insufficient documentation

## 2014-03-02 DIAGNOSIS — Z79899 Other long term (current) drug therapy: Secondary | ICD-10-CM | POA: Insufficient documentation

## 2014-03-02 DIAGNOSIS — Z8719 Personal history of other diseases of the digestive system: Secondary | ICD-10-CM | POA: Insufficient documentation

## 2014-03-02 DIAGNOSIS — R51 Headache: Secondary | ICD-10-CM

## 2014-03-02 LAB — I-STAT CHEM 8, ED
BUN: 14 mg/dL (ref 6–23)
CREATININE: 0.8 mg/dL (ref 0.50–1.35)
Calcium, Ion: 1.17 mmol/L (ref 1.12–1.23)
Chloride: 107 mEq/L (ref 96–112)
GLUCOSE: 84 mg/dL (ref 70–99)
HCT: 45 % (ref 39.0–52.0)
HEMOGLOBIN: 15.3 g/dL (ref 13.0–17.0)
Potassium: 4 mEq/L (ref 3.7–5.3)
Sodium: 141 mEq/L (ref 137–147)
TCO2: 22 mmol/L (ref 0–100)

## 2014-03-02 LAB — CBC WITH DIFFERENTIAL/PLATELET
Basophils Absolute: 0 10*3/uL (ref 0.0–0.1)
Basophils Relative: 0 % (ref 0–1)
EOS ABS: 0.1 10*3/uL (ref 0.0–0.7)
EOS PCT: 2 % (ref 0–5)
HCT: 40.2 % (ref 39.0–52.0)
HEMOGLOBIN: 13.8 g/dL (ref 13.0–17.0)
LYMPHS ABS: 2.9 10*3/uL (ref 0.7–4.0)
Lymphocytes Relative: 39 % (ref 12–46)
MCH: 30.9 pg (ref 26.0–34.0)
MCHC: 34.3 g/dL (ref 30.0–36.0)
MCV: 90.1 fL (ref 78.0–100.0)
MONOS PCT: 10 % (ref 3–12)
Monocytes Absolute: 0.8 10*3/uL (ref 0.1–1.0)
Neutro Abs: 3.5 10*3/uL (ref 1.7–7.7)
Neutrophils Relative %: 49 % (ref 43–77)
Platelets: 225 10*3/uL (ref 150–400)
RBC: 4.46 MIL/uL (ref 4.22–5.81)
RDW: 13 % (ref 11.5–15.5)
WBC: 7.3 10*3/uL (ref 4.0–10.5)

## 2014-03-02 LAB — CK: CK TOTAL: 465 U/L — AB (ref 7–232)

## 2014-03-02 LAB — URINALYSIS, ROUTINE W REFLEX MICROSCOPIC
BILIRUBIN URINE: NEGATIVE
Glucose, UA: NEGATIVE mg/dL
HGB URINE DIPSTICK: NEGATIVE
Ketones, ur: NEGATIVE mg/dL
Leukocytes, UA: NEGATIVE
NITRITE: NEGATIVE
PH: 6 (ref 5.0–8.0)
Protein, ur: NEGATIVE mg/dL
Urobilinogen, UA: 0.2 mg/dL (ref 0.0–1.0)

## 2014-03-02 LAB — I-STAT TROPONIN, ED: Troponin i, poc: 0 ng/mL (ref 0.00–0.08)

## 2014-03-02 MED ORDER — METOCLOPRAMIDE HCL 5 MG/ML IJ SOLN
10.0000 mg | Freq: Once | INTRAMUSCULAR | Status: AC
  Administered 2014-03-02: 10 mg via INTRAVENOUS
  Filled 2014-03-02: qty 2

## 2014-03-02 MED ORDER — DIPHENHYDRAMINE HCL 50 MG/ML IJ SOLN
25.0000 mg | Freq: Once | INTRAMUSCULAR | Status: AC
  Administered 2014-03-02: 25 mg via INTRAVENOUS
  Filled 2014-03-02: qty 1

## 2014-03-02 MED ORDER — SODIUM CHLORIDE 0.9 % IV SOLN
1000.0000 mL | Freq: Once | INTRAVENOUS | Status: AC
  Administered 2014-03-02: 1000 mL via INTRAVENOUS

## 2014-03-02 MED ORDER — CYCLOBENZAPRINE HCL 5 MG PO TABS: 5.0000 mg | ORAL_TABLET | Freq: Three times a day (TID) | ORAL | Status: DC | PRN

## 2014-03-02 MED ORDER — SODIUM CHLORIDE 0.9 % IV SOLN: 1000.0000 mL | INTRAVENOUS | Status: DC

## 2014-03-02 MED ORDER — KETOROLAC TROMETHAMINE 30 MG/ML IJ SOLN
30.0000 mg | Freq: Once | INTRAMUSCULAR | Status: AC
  Administered 2014-03-02: 30 mg via INTRAVENOUS
  Filled 2014-03-02: qty 1

## 2014-03-02 MED ORDER — NAPROXEN 500 MG PO TABS: 500.0000 mg | ORAL_TABLET | Freq: Two times a day (BID) | ORAL | Status: DC

## 2014-03-02 MED ORDER — SODIUM CHLORIDE 0.9 % IV BOLUS (SEPSIS)
1000.0000 mL | Freq: Once | INTRAVENOUS | Status: AC
  Administered 2014-03-02: 1000 mL via INTRAVENOUS

## 2014-03-02 NOTE — ED Provider Notes (Signed)
CSN: [id number]     Arrival date & time 03/02/14  1816 History   First MD Initiated Contact with Patient 03/02/14 1823     Chief Complaint  Patient presents with  . Chest Pain     (Consider location/radiation/quality/duration/timing/severity/associated sxs/prior Treatment) HPI Patient reports he had been at work about an hour today, which was 5 PM. He reports he been sweating heavily. He hadn't drank any fluids while at work. He states he got dizzy and felt like he was going to pass out and that he was spinning. He states he started having a diffuse pounding headache. He had nausea without vomiting. He has photophobia without noise sensitivity. He states he gets headaches often at least 2-3 times a week. They could be on any location of his head. He also states he had a pain in the center of his chest that he can only describe as "pain". He states he doesn't get much probably once every few months. He states it makes him feel short of breath. Nothing makes the pain worse, nothing makes the pain feel better.  Mother patient keeps saying "his blood pressure is high". He states when he saw his psychiatrist recently his blood pressure was high. He has not been treated for hypertension.  PCP none Psychiatrist Dr Diannia Rudereborah Ross (writes his adderall) Orthopedist Dr Hilda LiasKeeling (writes his hydrocodone)  Past Medical History  Diagnosis Date  . ADHD (attention deficit hyperactivity disorder)   . GERD (gastroesophageal reflux disease)   . Pancreas disorder  two years ago    Hospitalized for Pancreatitis   Past Surgical History  Procedure Laterality Date  . Hernia repair    . Anterior cruciate ligament repair      Left knee Dr. Edger HouseMcGinley   Family History  Problem Relation Age of Onset  . ADD / ADHD Brother   . ADD / ADHD Maternal Aunt   . Drug abuse Maternal Uncle   . Alcohol abuse Maternal Uncle   . Schizophrenia Maternal Uncle   . Anxiety disorder Neg Hx   . Bipolar disorder Neg Hx   .  Dementia Neg Hx   . Depression Neg Hx   . OCD Neg Hx   . Paranoid behavior Neg Hx   . Seizures Neg Hx   . Sexual abuse Neg Hx   . Physical abuse Neg Hx    History  Substance Use Topics  . Smoking status: Never Smoker   . Smokeless tobacco: Never Used  . Alcohol Use: No  employed  Review of Systems  All other systems reviewed and are negative.     Allergies  Bee venom  Home Medications   Prior to Admission medications   Medication Sig Start Date End Date Taking? Authorizing Provider  amphetamine-dextroamphetamine (ADDERALL XR) 30 MG 24 hr capsule Take 1 capsule (30 mg total) by mouth every morning. 02/23/14   Diannia Rudereborah Ross, MD  amphetamine-dextroamphetamine (ADDERALL XR) 30 MG 24 hr capsule Take 1 capsule (30 mg total) by mouth every morning. 02/23/14   Diannia Rudereborah Ross, MD  amphetamine-dextroamphetamine (ADDERALL XR) 30 MG 24 hr capsule Take 1 capsule (30 mg total) by mouth every morning. 02/23/14   Diannia Rudereborah Ross, MD  HYDROcodone-acetaminophen (NORCO) 7.5-325 MG per tablet Take 1 tablet by mouth every 6 (six) hours as needed for pain.    Historical Provider, MD   BP 138/100  Pulse 86  Temp(Src) 97.7 F (36.5 C) (Oral)  Resp 18  Ht 5' 6.5" (1.689 m)  Wt 196 lb (88.905  kg)  BMI 31.16 kg/m2  SpO2 100%  Vital signs normal   Physical Exam  Nursing note and vitals reviewed. Constitutional: He is oriented to person, place, and time. He appears well-developed and well-nourished.  Non-toxic appearance. He does not appear ill. No distress.  Has his arms over his face/eyes, patient in a dark room   HENT:  Head: Normocephalic and atraumatic.  Right Ear: External ear normal.  Left Ear: External ear normal.  Nose: Nose normal. No mucosal edema or rhinorrhea.  Mouth/Throat: Mucous membranes are normal. No dental abscesses or uvula swelling.  Tongue dry  Eyes: Conjunctivae and EOM are normal. Pupils are equal, round, and reactive to light.  Neck: Normal range of motion and full  passive range of motion without pain. Neck supple.  Cardiovascular: Normal rate, regular rhythm and normal heart sounds.  Exam reveals no gallop and no friction rub.   No murmur heard. Pulmonary/Chest: Effort normal and breath sounds normal. No respiratory distress. He has no wheezes. He has no rhonchi. He has no rales. He exhibits no tenderness and no crepitus.    Abdominal: Soft. Normal appearance and bowel sounds are normal. He exhibits no distension. There is no tenderness. There is no rebound and no guarding.  Musculoskeletal: Normal range of motion. He exhibits no edema and no tenderness.  Moves all extremities well.   Neurological: He is alert and oriented to person, place, and time. He has normal strength. No cranial nerve deficit.  Skin: Skin is warm, dry and intact. No rash noted. No erythema. No pallor.  Psychiatric: He has a normal mood and affect. His speech is normal and behavior is normal. His mood appears not anxious.    ED Course  Procedures (including critical care time)  Medications  0.9 %  sodium chloride infusion (0 mLs Intravenous Stopped 03/02/14 2110)    Followed by  0.9 %  sodium chloride infusion (not administered)  metoCLOPramide (REGLAN) injection 10 mg (10 mg Intravenous Given 03/02/14 2013)  diphenhydrAMINE (BENADRYL) injection 25 mg (25 mg Intravenous Given 03/02/14 2014)  ketorolac (TORADOL) 30 MG/ML injection 30 mg (30 mg Intravenous Given 03/02/14 2014)  sodium chloride 0.9 % bolus 1,000 mL (1,000 mLs Intravenous New Bag/Given 03/02/14 2110)   MOP keeps saying "His blood pressure is sky high". BP in triage was 130/100.  Recheck at 2100 patient states his headache is gone. His chest pain is gone. He has only gotten 1 L of IV fluids. He was given a second liter. Patient's blood pressure at discharge was 107/66.   Labs Review Results for orders placed during the hospital encounter of 03/02/14  CBC WITH DIFFERENTIAL      Result Value Ref Range   WBC 7.3   4.0 - 10.5 K/uL   RBC 4.46  4.22 - 5.81 MIL/uL   Hemoglobin 13.8  13.0 - 17.0 g/dL   HCT 60.4  54.0 - 98.1 %   MCV 90.1  78.0 - 100.0 fL   MCH 30.9  26.0 - 34.0 pg   MCHC 34.3  30.0 - 36.0 g/dL   RDW 19.1  47.8 - 29.5 %   Platelets 225  150 - 400 K/uL   Neutrophils Relative % 49  43 - 77 %   Neutro Abs 3.5  1.7 - 7.7 K/uL   Lymphocytes Relative 39  12 - 46 %   Lymphs Abs 2.9  0.7 - 4.0 K/uL   Monocytes Relative 10  3 - 12 %   Monocytes Absolute  0.8  0.1 - 1.0 K/uL   Eosinophils Relative 2  0 - 5 %   Eosinophils Absolute 0.1  0.0 - 0.7 K/uL   Basophils Relative 0  0 - 1 %   Basophils Absolute 0.0  0.0 - 0.1 K/uL  CK      Result Value Ref Range   Total CK 465 (*) 7 - 232 U/L  URINALYSIS, ROUTINE W REFLEX MICROSCOPIC      Result Value Ref Range   Color, Urine YELLOW  YELLOW   APPearance CLEAR  CLEAR   Specific Gravity, Urine >1.030 (*) 1.005 - 1.030   pH 6.0  5.0 - 8.0   Glucose, UA NEGATIVE  NEGATIVE mg/dL   Hgb urine dipstick NEGATIVE  NEGATIVE   Bilirubin Urine NEGATIVE  NEGATIVE   Ketones, ur NEGATIVE  NEGATIVE mg/dL   Protein, ur NEGATIVE  NEGATIVE mg/dL   Urobilinogen, UA 0.2  0.0 - 1.0 mg/dL   Nitrite NEGATIVE  NEGATIVE   Leukocytes, UA NEGATIVE  NEGATIVE  I-STAT CHEM 8, ED      Result Value Ref Range   Sodium 141  137 - 147 mEq/L   Potassium 4.0  3.7 - 5.3 mEq/L   Chloride 107  96 - 112 mEq/L   BUN 14  6 - 23 mg/dL   Creatinine, Ser 1.610.80  0.50 - 1.35 mg/dL   Glucose, Bld 84  70 - 99 mg/dL   Calcium, Ion 0.961.17  0.451.12 - 1.23 mmol/L   TCO2 22  0 - 100 mmol/L   Hemoglobin 15.3  13.0 - 17.0 g/dL   HCT 40.945.0  81.139.0 - 91.452.0 %  I-STAT TROPOININ, ED      Result Value Ref Range   Troponin i, poc 0.00  0.00 - 0.08 ng/mL   Comment 3            Laboratory interpretation all normal except mildly elevated CK (rhabdomyolysis is not present) Dg Chest 2 View  03/02/2014   CLINICAL DATA:  Chest pain and right arm numbness.  EXAM: CHEST  2 VIEW  COMPARISON:  Prior radiograph from  04/10/2012  FINDINGS: The cardiac and mediastinal silhouettes are stable in size and contour, and remain within normal limits.  The lungs are normally inflated. No airspace consolidation, pleural effusion, or pulmonary edema is identified. There is no pneumothorax.  No acute osseous abnormality identified.  IMPRESSION: No acute cardiopulmonary abnormality.   Electronically Signed   By: Rise MuBenjamin  McClintock M.D.   On: 03/02/2014 19:52      Imaging Review    EKG Interpretation   Date/Time:  Tuesday Mar 02 2014 18:29:24 EDT Ventricular Rate:  74 PR Interval:  145 QRS Duration: 94 QT Interval:  391 QTC Calculation: 434 R Axis:   58 Text Interpretation:  Sinus rhythm Normal ECG No old tracing to compare  Confirmed by Lilliauna Van  MD-I, Honi Name ([id number]) on 03/02/2014 6:37:56 PM      MDM  patient reports after sweating a lot at work, is in very hot the past 2 days up to 90, with acute headache and chest pain. He most likely has a heat related illness. He responded to IV fluids and migraine cocktail.    Final diagnoses:  Heat exhaustion  Headache  Chest wall pain    New Prescriptions   CYCLOBENZAPRINE (FLEXERIL) 5 MG TABLET    Take 1 tablet (5 mg total) by mouth 3 (three) times daily as needed (sore muscles in chest).   NAPROXEN (NAPROSYN) 500 MG  TABLET    Take 1 tablet (500 mg total) by mouth 2 (two) times daily.    Plan discharge   Devoria Albe, MD, Franz Dell, MD 03/02/14 2201

## 2014-03-02 NOTE — Discharge Instructions (Signed)
Drink plenty of fluids like gatorade, especially when it is hot and you are sweating a lot. Take the naproxen and flexeril for your chest wall pain. Consider seeing Dr Gerilyn Pilgrimoonquah, a neurologist in [place] about your headaches. You might benefit from a preventative medication.  Chest Wall Pain Chest wall pain is pain felt in or around the chest bones and muscles. It may take up to 6 weeks to get better. It may take longer if you are active. Chest wall pain can happen on its own. Other times, things like germs, injury, coughing, or exercise can cause the pain. HOME CARE   Avoid activities that make you tired or cause pain. Try not to use your chest, belly (abdominal), or side muscles. Do not use heavy weights.  Put ice on the sore area.  Put ice in a plastic bag.  Place a towel between your skin and the bag.  Leave the ice on for 15-20 minutes for the first 2 days.  Only take medicine as told by your doctor. GET HELP RIGHT AWAY IF:   You have more pain or are very uncomfortable.  You have a fever.  Your chest pain gets worse.  You have new problems.  You feel sick to your stomach (nauseous) or throw up (vomit).  You start to sweat or feel lightheaded.  You have a cough with mucus (phlegm).  You cough up blood. MAKE SURE YOU:   Understand these instructions.  Will watch your condition.  Will get help right away if you are not doing well or get worse. Document Released: 03/19/2008 Document Revised: 12/24/2011 Document Reviewed: 05/28/2011 [facility] Patient Information 2014 [employer] [place] [place].  Heat-Related Illness Heat-related illnesses occur when the body is unable to properly cool itself. The body normally cools itself by sweating. However, under some conditions sweating is not enough. In these cases, a person's body temperature rises rapidly. Very high body temperatures may damage the brain or other vital organs. Some examples of heat-related illnesses include:  Heat stroke.  This occurs when the body is unable to regulate its temperature. The body's temperature rises rapidly, the sweating mechanism fails, and the body is unable to cool down. Body temperature may rise to 106 F (41 C) or higher within 10 to 15 minutes. Heat stroke can cause death or permanent disability if emergency treatment is not provided.  Heat exhaustion. This is a milder form of heat-related illness that can develop after several days of exposure to high temperatures and not enough fluids. It is the body's response to an excessive loss of the water and salt contained in sweat.  Heat cramps. These usually affect people who sweat a lot during heavy activity. This sweating drains the body's salt and moisture. The low salt level in the muscles causes painful cramps. Heat cramps may also be a symptom of heat exhaustion. Heat cramps usually occur in the abdomen, arms, or legs. Get medical attention for cramps if you have heart problems or are on a low-sodium diet. Those that are at greatest risk for heat-related illnesses include:   The elderly.  Infant and the very young.  People with mental illness and chronic diseases.  People who are overweight (obese).  Young and healthy people can even succumb to heat if they participate in strenuous physical activities during hot weather. CAUSES  Several factors affect the body's ability to cool itself during extremely hot weather. When the humidity is high, sweat will not evaporate as quickly. This prevents the body from releasing  heat quickly. Other factors that can affect the body's ability to cool down include:   Age.  Obesity.  Fever.  Dehydration.  Heart disease.  Mental illness.  Poor circulation.  Sunburn.  Prescription drug use.  Alcohol use. SYMPTOMS  Heat stroke: Warning signs of heat stroke vary, but may include:  An extremely high body temperature (above 103F orally).  A fast, strong  pulse.  Dizziness.  Confusion.  Red, hot, and dry skin.  No sweating.  Throbbing headache.  Feeling sick to your stomach (nauseous).  Unconsciousness. Heat exhaustion: Warning signs of heat exhaustion include:  Heavy sweating.  Tiredness.  Headache.  Paleness.  Weakness.  Feeling sick to your stomach (nauseous) or vomiting.  Muscle cramps. Heat cramps  Muscle pains or spasms. TREATMENT  Heat stroke  Get into a cool environment. An indoor place that is air-conditioned may be best.  Take a cool shower or bath. Have someone around to make sure you are okay.  Take your temperature. Make sure it is going down. Heat exhaustion  Drink plenty of fluids. Do not drink liquids that contain caffeine, alcohol, or large amounts of sugar. These cause you to lose more body fluid. Also, avoid very cold drinks. They can cause stomach cramps.  Get into a cool environment. An indoor place that is air-conditioned may be best.  Take a cool shower or bath. Have someone around to make sure you are okay.  Put on lightweight clothing. Heat cramps  Stop whatever activity you were doing. Do not attempt to do that activity for at least 3 hours after the cramps have gone away.  Get into a cool environment. An indoor place that is air-conditioned may be best. HOME CARE INSTRUCTIONS  To protect your health when temperatures are extremely high, follow these tips:  During heavy exercise in a hot environment, drink two to four glasses (16-32 ounces) of cool fluids each hour. Do not wait until you are thirsty to drink. Warning: If your caregiver limits the amount of fluid you drink or has you on water pills, ask how much you should drink while the weather is hot.  Do not drink liquids that contain caffeine, alcohol, or large amounts of sugar. These cause you to lose more body fluid.  Avoid very cold drinks. They can cause stomach cramps.  Wear appropriate clothing. Choose lightweight,  light-colored, loose-fitting clothing.  If you must be outdoors, try to limit your outdoor activity to morning and evening hours. Try to rest often in shady areas.  If you are not used to working or exercising in a hot environment, start slowly and pick up the pace gradually.  Stay cool in an air-conditioned place if possible. If your home does not have air conditioning, go to the shopping mall or [employer].  Taking a cool shower or bath may help you cool off. SEEK MEDICAL CARE IF:   You see any of the symptoms listed above. You may be dealing with a life-threatening emergency.  Symptoms worsen or last longer than 1 hour.  Heat cramps do not get better in 1 hour. MAKE SURE YOU:   Understand these instructions.  Will watch your condition.  Will get help right away if you are not doing well or get worse. Document Released: 07/10/2008 Document Revised: 12/24/2011 Document Reviewed: 07/10/2008 [facility] Patient Information 2014 [place], [place].

## 2014-03-02 NOTE — ED Notes (Signed)
Pt. States "Are my labs back yet? Can I leave yet?" Explained to pt. That labs were beginning to come back and EDP would come to discuss the results. Explained to pt. Importance of staying to receive IV fluids. Pt. Verbalized understanding.

## 2014-03-02 NOTE — ED Notes (Signed)
Pt states while at work with mid CP at 1700, denies drinking plenty of fluids, denies N/V or SOB

## 2014-03-02 NOTE — ED Notes (Signed)
Dr.Knapp at bedside  

## 2014-05-26 ENCOUNTER — Ambulatory Visit (INDEPENDENT_AMBULATORY_CARE_PROVIDER_SITE_OTHER): Payer: 59 | Admitting: Psychiatry

## 2014-05-26 ENCOUNTER — Encounter (HOSPITAL_COMMUNITY): Payer: Self-pay | Admitting: Psychiatry

## 2014-05-26 VITALS — BP 140/100 | Ht 66.0 in | Wt 211.0 lb

## 2014-05-26 DIAGNOSIS — F6381 Intermittent explosive disorder: Secondary | ICD-10-CM

## 2014-05-26 DIAGNOSIS — F902 Attention-deficit hyperactivity disorder, combined type: Secondary | ICD-10-CM

## 2014-05-26 DIAGNOSIS — F909 Attention-deficit hyperactivity disorder, unspecified type: Secondary | ICD-10-CM

## 2014-05-26 MED ORDER — AMPHETAMINE-DEXTROAMPHET ER 30 MG PO CP24: 30.0000 mg | ORAL_CAPSULE | Freq: Every day | ORAL | Status: DC

## 2014-05-26 MED ORDER — AMPHETAMINE-DEXTROAMPHET ER 30 MG PO CP24: 30.0000 mg | ORAL_CAPSULE | ORAL | Status: DC

## 2014-05-26 NOTE — Progress Notes (Signed)
Patient ID: Joseph Blair, male   DOB: 01/19/1991, 23 y.o.   MRN: [id number] Patient ID: Joseph Blair, male   DOB: 01/19/1991, 23 y.o.   MRN: [id number] Patient ID: Joseph Blair, male   DOB: 01/19/1991, 23 y.o.   MRN: [id number] Patient ID: Joseph Blair, male   DOB: 01/19/1991, 23 y.o.   MRN: [id number] [name] [id number] Progress Note Joseph Blair MRN: [id number] DOB: 01/19/1991 Age: 23 y.o.  Date: 05/26/2014 Start Time: 10:00 AM End Time: 10:15 AM  Chief Complaint: ADHD   Subjective: I am doing well.  Patient is a 23 year old married white male lives with his wife.and baby son . He now works at [employer] The patient has a history of ADHD but he can focus well at work with the Adderall XR. He's doing well at his job. Pt reports that he is compliant with the psychotropic medications with good benefit and no noticeable side effects. He is staying well focused. His temper is under good control. He is sleeping well.  His blood pressures running a bit high today and I told him this needed to be addressed with his primary Dr. I told him this last time as well. He was seen in the ER in May with heat exhaustion and his blood pressure was high but came down he was given IV fluids. I told him this time he really need to get on some medication from his primary Dr.   Theodoro KosVitals BP 140/100 211 lbs, 5'6"  Allergies: Allergies  Allergen Reactions  . Bee Venom Swelling  Medical History: Past Medical History  Diagnosis Date  . ADHD (attention deficit hyperactivity disorder)   . GERD (gastroesophageal reflux disease)   . 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 Hernia repair    . Anterior cruciate ligament repair      Left knee Dr. Edger HouseMcGinley  Family History: family history includes ADD / ADHD in his brother and maternal aunt; Alcohol abuse in his maternal uncle; Drug  abuse in his maternal uncle; Schizophrenia in his maternal uncle. There is no history of Anxiety disorder, Bipolar disorder, Dementia, Depression, OCD, Paranoid behavior, Seizures, Sexual abuse, or Physical abuse. Reviewed again in full today in the visit.  Mental Status Examination  Appearance: Casually dressed Alert: Yes Attention: fair  Cooperative: Yes Eye Contact: Good Speech: Normal in volume, rate, tone, spontaneous  Psychomotor Activity: Normal Memory/Concentration: OK Oriented: person, place and situation Mood: Euthymic Affect: Full Range Thought Processes and Associations: Goal Directed Fund of Knowledge: Fair Thought Content: Suicidal ideation, Homicidal ideation, Auditory hallucinations, Visual hallucinations, Delusions and Paranoia-none reported Insight: Good Judgement: Good  Lab Results:  Results for orders placed during the hospital encounter of 03/02/14 (from the past 8736 hour(s))  URINALYSIS, ROUTINE W REFLEX MICROSCOPIC   Collection Time    03/02/14  7:30 PM      Result Value Ref Range   Color, Urine YELLOW  YELLOW   APPearance CLEAR  CLEAR   Specific Gravity, Urine >1.030 (*) 1.005 - 1.030   pH 6.0  5.0 - 8.0   Glucose, UA NEGATIVE  NEGATIVE mg/dL   Hgb urine dipstick NEGATIVE  NEGATIVE   Bilirubin Urine NEGATIVE  NEGATIVE   Ketones, ur NEGATIVE  NEGATIVE mg/dL   Protein, ur NEGATIVE  NEGATIVE mg/dL   Urobilinogen, UA 0.2  0.0 - 1.0 mg/dL   Nitrite NEGATIVE  NEGATIVE  Leukocytes, UA NEGATIVE  NEGATIVE  CBC WITH DIFFERENTIAL   Collection Time    03/02/14  7:52 PM      Result Value Ref Range   WBC 7.3  4.0 - 10.5 K/uL   RBC 4.46  4.22 - 5.81 MIL/uL   Hemoglobin 13.8  13.0 - 17.0 g/dL   HCT 19.1  47.8 - 29.5 %   MCV 90.1  78.0 - 100.0 fL   MCH 30.9  26.0 - 34.0 pg   MCHC 34.3  30.0 - 36.0 g/dL   RDW 62.1  30.8 - 65.7 %   Platelets 225  150 - 400 K/uL   Neutrophils Relative % 49  43 - 77 %   Neutro Abs 3.5  1.7 - 7.7 K/uL   Lymphocytes Relative  39  12 - 46 %   Lymphs Abs 2.9  0.7 - 4.0 K/uL   Monocytes Relative 10  3 - 12 %   Monocytes Absolute 0.8  0.1 - 1.0 K/uL   Eosinophils Relative 2  0 - 5 %   Eosinophils Absolute 0.1  0.0 - 0.7 K/uL   Basophils Relative 0  0 - 1 %   Basophils Absolute 0.0  0.0 - 0.1 K/uL  CK   Collection Time    03/02/14  7:52 PM      Result Value Ref Range   Total CK 465 (*) 7 - 232 U/L  I-STAT TROPOININ, ED   Collection Time    03/02/14  8:13 PM      Result Value Ref Range   Troponin i, poc 0.00  0.00 - 0.08 ng/mL   Comment 3           I-STAT CHEM 8, ED   Collection Time    03/02/14  8:15 PM      Result Value Ref Range   Sodium 141  137 - 147 mEq/L   Potassium 4.0  3.7 - 5.3 mEq/L   Chloride 107  96 - 112 mEq/L   BUN 14  6 - 23 mg/dL   Creatinine, Ser 8.46  0.50 - 1.35 mg/dL   Glucose, Bld 84  70 - 99 mg/dL   Calcium, Ion 9.62  9.52 - 1.23 mmol/L   TCO2 22  0 - 100 mmol/L   Hemoglobin 15.3  13.0 - 17.0 g/dL   HCT 84.1  32.4 - 40.1 %   PCP orders annual labs with no concerns reported.  Diagnosis: ADHD combined type, moderate severity, intermittent explosive disorder  Plan: I took his vitals.  I reviewed CC, tobacco/med/surg Hx, meds effects/ side effects, problem list, therapies and responses as well as current situation/symptoms discussed options. Continue current effective medications. See primary physician about hypertension See orders and pt instructions for more details. MEDICATIONS this encounter:AdderallXR 30 mg QAM 3 months given   Medical Decision Making Problem Points:  Established problem, stable/improving (1), Review of last therapy session (1) and Review of psycho-social stressors (1) Data Points:  Review or order clinical lab tests (1) Review of medication regiment & side effects (2)  I certify that outpatient services furnished can reasonably be expected to improve the patient's condition.   Diannia Ruder, MD

## 2014-08-26 ENCOUNTER — Ambulatory Visit (INDEPENDENT_AMBULATORY_CARE_PROVIDER_SITE_OTHER): Payer: MEDICAID | Admitting: Psychiatry

## 2014-08-26 ENCOUNTER — Encounter (HOSPITAL_COMMUNITY): Payer: Self-pay | Admitting: Psychiatry

## 2014-08-26 VITALS — BP 142/101 | HR 89 | Ht 66.0 in | Wt 197.0 lb

## 2014-08-26 DIAGNOSIS — F6381 Intermittent explosive disorder: Secondary | ICD-10-CM

## 2014-08-26 DIAGNOSIS — F902 Attention-deficit hyperactivity disorder, combined type: Secondary | ICD-10-CM

## 2014-08-26 MED ORDER — AMPHETAMINE-DEXTROAMPHET ER 30 MG PO CP24: 30.0000 mg | ORAL_CAPSULE | ORAL | Status: DC

## 2014-08-26 MED ORDER — AMPHETAMINE-DEXTROAMPHET ER 30 MG PO CP24: 30.0000 mg | ORAL_CAPSULE | Freq: Every day | ORAL | Status: DC

## 2014-08-26 NOTE — Progress Notes (Signed)
Patient ID: Joseph HillockCharlie D Blair, male   DOB: December 29, 1990, 23 y.o.   MRN: [id number] Patient ID: Joseph HillockCharlie D Blair, male   DOB: December 29, 1990, 23 y.o.   MRN: [id number] Patient ID: Joseph HillockCharlie D Blair, male   DOB: December 29, 1990, 23 y.o.   MRN: [id number] Patient ID: Joseph HillockCharlie D Blair, male   DOB: December 29, 1990, 23 y.o.   MRN: [id number] Patient ID: Joseph HillockCharlie D Blair, male   DOB: December 29, 1990, 23 y.o.   MRN: [id number] [facility] [id number] Progress Note Joseph HillockCharlie D Langworthy MRN: [id number] DOB: December 29, 1990 Age: 23 y.o.  Date: 08/26/2014 Start Time: 10:00 AM End Time: 10:15 AM  Chief Complaint: ADHD   Subjective: I am doing well.  Patient is a 23 year old married white male lives with his wife.and baby son . He now works at [employer] The patient has a history of ADHD but he can focus well at work with the Adderall XR. He's doing well at his job. Pt reports that he is compliant with the psychotropic medications with good benefit and no noticeable side effects. He is staying well focused. His temper is under good control. He is sleeping well.  His blood pressure is running high again I told him for the second time that he I cannot continue to prescribe Adderall unless he gets is still with. He has an appointment with his primary doctor later this month.   Vitals BP 140/100 211 lbs, 5'6"  Allergies: Allergies  Allergen Reactions  . Bee Venom Swelling  Medical History: Past Medical History  Diagnosis Date  . ADHD (attention deficit hyperactivity disorder)   . GERD (gastroesophageal reflux disease)   . 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 Hernia repair    . Anterior cruciate ligament repair      Left knee Dr. Edger HouseMcGinley  Family History: family history includes ADD / ADHD in his brother and maternal aunt; Alcohol abuse in his maternal uncle; Drug abuse in his maternal uncle; Schizophrenia in his  maternal uncle. There is no history of Anxiety disorder, Bipolar disorder, Dementia, Depression, OCD, Paranoid behavior, Seizures, Sexual abuse, or Physical abuse. Reviewed again in full today in the visit.  Mental Status Examination  Appearance: Casually dressed Alert: Yes Attention: fair  Cooperative: Yes Eye Contact: Good Speech: Normal in volume, rate, tone, spontaneous  Psychomotor Activity: Normal Memory/Concentration: OK Oriented: person, place and situation Mood: Euthymic Affect: Full Range Thought Processes and Associations: Goal Directed Fund of Knowledge: Fair Thought Content: Suicidal ideation, Homicidal ideation, Auditory hallucinations, Visual hallucinations, Delusions and Paranoia-none reported Insight: Good Judgement: Good  Lab Results:  Results for orders placed or performed during the hospital encounter of 03/02/14 (from the past 8736 hour(s))  Urinalysis, Routine w reflex microscopic   Collection Time: 03/02/14  7:30 PM  Result Value Ref Range   Color, Urine YELLOW YELLOW   APPearance CLEAR CLEAR   Specific Gravity, Urine >1.030 (H) 1.005 - 1.030   pH 6.0 5.0 - 8.0   Glucose, UA NEGATIVE NEGATIVE mg/dL   Hgb urine dipstick NEGATIVE NEGATIVE   Bilirubin Urine NEGATIVE NEGATIVE   Ketones, ur NEGATIVE NEGATIVE mg/dL   Protein, ur NEGATIVE NEGATIVE mg/dL   Urobilinogen, UA 0.2 0.0 - 1.0 mg/dL   Nitrite NEGATIVE NEGATIVE   Leukocytes, UA NEGATIVE NEGATIVE  CBC with Differential   Collection Time: 03/02/14  7:52 PM  Result Value Ref Range   WBC 7.3 4.0 - 10.5 K/uL  RBC 4.46 4.22 - 5.81 MIL/uL   Hemoglobin 13.8 13.0 - 17.0 g/dL   HCT 16.140.2 09.639.0 - 04.552.0 %   MCV 90.1 78.0 - 100.0 fL   MCH 30.9 26.0 - 34.0 pg   MCHC 34.3 30.0 - 36.0 g/dL   RDW 40.913.0 81.111.5 - 91.415.5 %   Platelets 225 150 - 400 K/uL   Neutrophils Relative % 49 43 - 77 %   Neutro Abs 3.5 1.7 - 7.7 K/uL   Lymphocytes Relative 39 12 - 46 %   Lymphs Abs 2.9 0.7 - 4.0 K/uL   Monocytes Relative 10 3 -  12 %   Monocytes Absolute 0.8 0.1 - 1.0 K/uL   Eosinophils Relative 2 0 - 5 %   Eosinophils Absolute 0.1 0.0 - 0.7 K/uL   Basophils Relative 0 0 - 1 %   Basophils Absolute 0.0 0.0 - 0.1 K/uL  CK   Collection Time: 03/02/14  7:52 PM  Result Value Ref Range   Total CK 465 (H) 7 - 232 U/L  I-Stat Troponin, ED   Collection Time: 03/02/14  8:13 PM  Result Value Ref Range   Troponin i, poc 0.00 0.00 - 0.08 ng/mL   Comment 3          I-Stat Chem 8, ED   Collection Time: 03/02/14  8:15 PM  Result Value Ref Range   Sodium 141 137 - 147 mEq/L   Potassium 4.0 3.7 - 5.3 mEq/L   Chloride 107 96 - 112 mEq/L   BUN 14 6 - 23 mg/dL   Creatinine, Ser 7.820.80 0.50 - 1.35 mg/dL   Glucose, Bld 84 70 - 99 mg/dL   Calcium, Ion 9.561.17 2.131.12 - 1.23 mmol/L   TCO2 22 0 - 100 mmol/L   Hemoglobin 15.3 13.0 - 17.0 g/dL   HCT 08.645.0 57.839.0 - 46.952.0 %   PCP orders annual labs with no concerns reported.  Diagnosis: ADHD combined type, moderate severity, intermittent explosive disorder  Plan: I took his vitals.  I reviewed CC, tobacco/med/surg Hx, meds effects/ side effects, problem list, therapies and responses as well as current situation/symptoms discussed options. Continue current effective medications. See primary physician about hypertension See orders and pt instructions for more details. MEDICATIONS this encounter:AdderallXR 30 mg QAM 3 months given   Medical Decision Making Problem Points:  Established problem, stable/improving (1), Review of last therapy session (1) and Review of psycho-social stressors (1) Data Points:  Review or order clinical lab tests (1) Review of medication regiment & side effects (2)  I certify that outpatient services furnished can reasonably be expected to improve the patient's condition.   Diannia RuderOSS, Dequavious Harshberger, MD

## 2014-08-29 ENCOUNTER — Emergency Department (HOSPITAL_COMMUNITY): Payer: Medicaid Other

## 2014-08-29 ENCOUNTER — Emergency Department (HOSPITAL_COMMUNITY)
Admission: EM | Admit: 2014-08-29 | Discharge: 2014-08-29 | Disposition: A | Discharge status: Home / Self Care | Payer: Medicaid Other | Attending: Emergency Medicine | Admitting: Emergency Medicine

## 2014-08-29 ENCOUNTER — Encounter (HOSPITAL_COMMUNITY): Payer: Self-pay

## 2014-08-29 DIAGNOSIS — S6991XA Unspecified injury of right wrist, hand and finger(s), initial encounter: Secondary | ICD-10-CM | POA: Diagnosis present

## 2014-08-29 DIAGNOSIS — Y9389 Activity, other specified: Secondary | ICD-10-CM | POA: Diagnosis not present

## 2014-08-29 DIAGNOSIS — Y998 Other external cause status: Secondary | ICD-10-CM | POA: Insufficient documentation

## 2014-08-29 DIAGNOSIS — Z8719 Personal history of other diseases of the digestive system: Secondary | ICD-10-CM | POA: Insufficient documentation

## 2014-08-29 DIAGNOSIS — W1839XA Other fall on same level, initial encounter: Secondary | ICD-10-CM | POA: Diagnosis not present

## 2014-08-29 DIAGNOSIS — S63501A Unspecified sprain of right wrist, initial encounter: Secondary | ICD-10-CM | POA: Insufficient documentation

## 2014-08-29 DIAGNOSIS — F909 Attention-deficit hyperactivity disorder, unspecified type: Secondary | ICD-10-CM | POA: Insufficient documentation

## 2014-08-29 DIAGNOSIS — Y929 Unspecified place or not applicable: Secondary | ICD-10-CM | POA: Diagnosis not present

## 2014-08-29 DIAGNOSIS — M25539 Pain in unspecified wrist: Secondary | ICD-10-CM

## 2014-08-29 DIAGNOSIS — Z791 Long term (current) use of non-steroidal anti-inflammatories (NSAID): Secondary | ICD-10-CM | POA: Insufficient documentation

## 2014-08-29 MED ORDER — TRAMADOL HCL 50 MG PO TABS: 50.0000 mg | ORAL_TABLET | Freq: Four times a day (QID) | ORAL | Status: DC | PRN

## 2014-08-29 MED ORDER — IBUPROFEN 600 MG PO TABS: 600.0000 mg | ORAL_TABLET | Freq: Four times a day (QID) | ORAL | Status: DC | PRN

## 2014-08-29 MED ORDER — IBUPROFEN 800 MG PO TABS
800.0000 mg | ORAL_TABLET | Freq: Once | ORAL | Status: AC
  Administered 2014-08-29: 800 mg via ORAL
  Filled 2014-08-29: qty 1

## 2014-08-29 NOTE — ED Provider Notes (Signed)
CSN: [id number]     Arrival date & time 08/29/14  1332 History  This chart was scribed for non-physician practitioner Burgess AmorJulie Keiyana Stehr, PA-C working with Benny LennertJoseph L Zammit, MD by Murriel HopperAlec Bankhead, ED Scribe. This patient was seen in room APFT23/APFT23 and the patient's care was started at 3:50 PM.    Chief Complaint  Patient presents with  . Wrist Pain     The history is provided by the patient. No language interpreter was used.    HPI Comments: Darrelyn HillockCharlie D Sabater is a 23 y.o. male who presents to the Emergency Department complaining of constant right wrist pain that began earlier today after a fall. Pt states that he was in his back yard walking through the woods with his dog and fell and landed on a log. Pt notes that he broke the same wrist 2 years ago. Pt denies taking any sort of medication to help with the pain. In addition, pt states that he is having an ACL reconstruction soon for a torn ACL.  He is awaiting appt with pcp in order to get a referral back to his orthopedist.     Past Medical History  Diagnosis Date  . ADHD (attention deficit hyperactivity disorder)   . GERD (gastroesophageal reflux disease)   . Pancreas disorder  two years ago    Hospitalized for Pancreatitis   Past Surgical History  Procedure Laterality Date  . Hernia repair    . Anterior cruciate ligament repair      Left knee Dr. Edger HouseMcGinley   Family History  Problem Relation Age of Onset  . ADD / ADHD Brother   . ADD / ADHD Maternal Aunt   . Drug abuse Maternal Uncle   . Alcohol abuse Maternal Uncle   . Schizophrenia Maternal Uncle   . Anxiety disorder Neg Hx   . Bipolar disorder Neg Hx   . Dementia Neg Hx   . Depression Neg Hx   . OCD Neg Hx   . Paranoid behavior Neg Hx   . Seizures Neg Hx   . Sexual abuse Neg Hx   . Physical abuse Neg Hx    History  Substance Use Topics  . Smoking status: Never Smoker   . Smokeless tobacco: Never Used  . Alcohol Use: No    Review of Systems  Constitutional:  Negative for fever.  Musculoskeletal: Positive for joint swelling and arthralgias. Negative for myalgias.  Neurological: Negative for weakness and numbness.      Allergies  Bee venom  Home Medications   Prior to Admission medications   Medication Sig Start Date End Date Taking? Authorizing Provider  amphetamine-dextroamphetamine (ADDERALL XR) 30 MG 24 hr capsule Take 1 capsule (30 mg total) by mouth every morning. 08/26/14  Yes Diannia Rudereborah Ross, MD  amphetamine-dextroamphetamine (ADDERALL XR) 30 MG 24 hr capsule Take 1 capsule (30 mg total) by mouth daily. Patient not taking: Reported on 08/29/2014 08/26/14   Diannia Rudereborah Ross, MD  amphetamine-dextroamphetamine (ADDERALL XR) 30 MG 24 hr capsule Take 1 capsule (30 mg total) by mouth daily. Patient not taking: Reported on 08/29/2014 08/26/14   Diannia Rudereborah Ross, MD  HYDROcodone-acetaminophen [facility]) 7.5-325 MG per tablet Take 1 tablet by mouth every 6 (six) hours as needed for pain.    Historical Provider, MD  naproxen (NAPROSYN) 500 MG tablet Take 1 tablet (500 mg total) by mouth 2 (two) times daily. Patient not taking: Reported on 08/29/2014 03/02/14   Ward GivensIva L Knapp, MD  traMADol (ULTRAM) 50 MG tablet Take 1 tablet (  50 mg total) by mouth every 6 (six) hours as needed. 08/29/14   Burgess AmorJulie Lenton Gendreau, PA-C   BP 119/102 mmHg  Pulse 73  Temp(Src) 99.1 F (37.3 C) (Oral)  Resp 16  Ht 5\' 6"  (1.676 m)  Wt 197 lb (89.359 kg)  BMI 31.81 kg/m2  SpO2 100% Physical Exam  Constitutional: He appears well-developed and well-nourished.  HENT:  Head: Atraumatic.  Neck: Normal range of motion.  Cardiovascular:  Pulses equal bilaterally  Musculoskeletal: He exhibits edema and tenderness.       Right wrist: He exhibits bony tenderness and swelling.  ttp at right ulnar styloid. No deformity appreciated. Pt has FROM of fingers, flex/ext of wrist without discomfort.  Pain worsens with lateral abduction of wrist.  Distal cap refill normal.  No forearm or elbow pain.   Neurological: He is alert. He has normal strength. He displays normal reflexes. No sensory deficit.  Skin: Skin is warm and dry.  Psychiatric: He has a normal mood and affect.    ED Course  Procedures (including critical care time)  DIAGNOSTIC STUDIES: Oxygen Saturation is 100% on RA, normal by my interpretation.    COORDINATION OF CARE: 3:58 PM Discussed treatment plan with pt at bedside and pt agreed to plan.   Labs Review Labs Reviewed - No data to display  Imaging Review Dg Wrist Complete Right  08/29/2014   CLINICAL DATA:  Pt states he tripped over a stump and fell while feeding his dog today. Pt c/o pain in Rt wrist most severe @ scaphoid area  EXAM: RIGHT WRIST - COMPLETE 3+ VIEW  COMPARISON:  09/13/2012  FINDINGS: Osseous fragment about the dorsal carpal bones is similar to the prior exam and likely related to a remote triquetrum fracture. No new fracture identified. Scaphoid intact.  IMPRESSION: Triquetrum fracture is unchanged and presumably chronic.  No acute finding.   Electronically Signed   By: Jeronimo GreavesKyle  Talbot M.D.   On: 08/29/2014 15:30     EKG Interpretation None      MDM   Final diagnoses:  Wrist sprain, right, initial encounter    Reviewed xrays with patient, comparing to previous xrays.  He does endorse chronic intermittent episodes of pain at this site since his previous fx. Suspect bony fragment on xray is old and todays injury represents a sprain,  However, placed in wrist splint, advised ice, elevation, prescribed tramadol. F/u with his orthopedist as planned.  States to see pcp next as next step for ortho f/u Hilda Lias([place]).  I personally performed the services described in this documentation, which was scribed in my presence. The recorded information has been reviewed and is accurate.   Burgess AmorJulie Shade Rivenbark, PA-C 08/29/14 1815  Benny LennertJoseph L Zammit, MD 08/30/14 [phone]

## 2014-08-29 NOTE — ED Notes (Signed)
Pt reports fell taking his dog out today.  C/O pain to lateral part of r wrist.  Can move wrist and fingers.  Radial pulse present and capillary refill wnl.

## 2014-08-29 NOTE — Discharge Instructions (Signed)
Sprain °A sprain is a tear in one of the strong, fibrous tissues that connect your bones (ligaments). The severity of the sprain depends on how much of the ligament is torn. The tear can be either partial or complete. °CAUSES  °Often, sprains are a result of a fall or an injury. The force of the impact causes the fibers of your ligament to stretch beyond their normal length. This excess tension causes the fibers of your ligament to tear. °SYMPTOMS  °You may have some loss of motion or increased pain within your normal range of motion. Other symptoms include: °· Bruising. °· Tenderness. °· Swelling. °DIAGNOSIS  °In order to diagnose a sprain, your caregiver will physically examine you to determine how torn the ligament is. Your caregiver may also suggest an X-ray exam to make sure no bones are broken. °TREATMENT  °If your ligament is only partially torn, treatment usually involves keeping the injured area in a fixed position (immobilization) for a short period. To do this, your caregiver will apply a bandage, cast, or splint to keep the area from moving until it heals. For a partially torn ligament, the healing process usually takes 2 to 3 weeks. °If your ligament is completely torn, you may need surgery to reconnect the ligament to the bone or to reconstruct the ligament. After surgery, a cast or splint may be applied and will need to stay on for 4 to 6 weeks while your ligament heals. °HOME CARE INSTRUCTIONS °· Keep the injured area elevated to decrease swelling. °· To ease pain and swelling, apply ice to your joint twice a day, for 2 to 3 days. °¨ Put ice in a plastic bag. °¨ Place a towel between your skin and the bag. °¨ Leave the ice on for 15 minutes. °· Only take over-the-counter or prescription medicine for pain as directed by your caregiver. °· Do not leave the injured area unprotected until pain and stiffness go away (usually 3 to 4 weeks). °· Do not allow your cast or splint to get wet. Cover your cast or  splint with a plastic bag when you shower or bathe. Do not swim. °· Your caregiver may suggest exercises for you to do during your recovery to prevent or limit permanent stiffness. °SEEK IMMEDIATE MEDICAL CARE IF: °· Your cast or splint becomes damaged. °· Your pain becomes worse. °MAKE SURE YOU: °· Understand these instructions. °· Will watch your condition. °· Will get help right away if you are not doing well or get worse. °Document Released: 09/28/2000 Document Revised: 12/24/2011 Document Reviewed: 10/13/2011 °[employer]® Patient Information ©2015 [employer]. This information is not intended to replace advice given to you by your health care provider. Make sure you discuss any questions you have with your health care provider. ° °

## 2014-11-26 ENCOUNTER — Ambulatory Visit (HOSPITAL_COMMUNITY): Payer: Self-pay | Admitting: Psychiatry

## 2014-11-29 ENCOUNTER — Ambulatory Visit (HOSPITAL_COMMUNITY): Payer: Self-pay | Admitting: Psychiatry

## 2014-12-08 ENCOUNTER — Encounter (HOSPITAL_COMMUNITY): Payer: Self-pay | Admitting: Psychiatry

## 2014-12-08 ENCOUNTER — Ambulatory Visit (INDEPENDENT_AMBULATORY_CARE_PROVIDER_SITE_OTHER): Payer: MEDICAID | Admitting: Psychiatry

## 2014-12-08 ENCOUNTER — Ambulatory Visit (HOSPITAL_COMMUNITY): Payer: Self-pay | Admitting: Psychiatry

## 2014-12-08 VITALS — BP 148/97 | HR 69 | Ht 66.0 in | Wt 201.0 lb

## 2014-12-08 DIAGNOSIS — F6381 Intermittent explosive disorder: Secondary | ICD-10-CM

## 2014-12-08 DIAGNOSIS — F902 Attention-deficit hyperactivity disorder, combined type: Secondary | ICD-10-CM

## 2014-12-08 MED ORDER — AMPHETAMINE-DEXTROAMPHET ER 30 MG PO CP24: 30.0000 mg | ORAL_CAPSULE | Freq: Every day | ORAL | Status: DC

## 2014-12-08 MED ORDER — AMPHETAMINE-DEXTROAMPHET ER 30 MG PO CP24: 30.0000 mg | ORAL_CAPSULE | ORAL | Status: DC

## 2014-12-08 NOTE — Progress Notes (Signed)
Patient ID: Joseph Blair, male   DOB: [birth year], 24 y.o.   MRN: [id number] Patient ID: Joseph Blair, male   DOB: [birth year], 24 y.o.   MRN: [id number] Patient ID: Joseph Blair, male   DOB: [birth year], 24 y.o.   MRN: [id number] Patient ID: Joseph Blair, male   DOB: [birth year], 24 y.o.   MRN: [id number] Patient ID: Joseph Blair, male   DOB: [birth year], 24 y.o.   MRN: [id number] Patient ID: Joseph Blair, male   DOB: [birth year], 24 y.o.   MRN: [id number] [facility] [id number] Progress Note Joseph Blair MRN: [id number] DOB: [birth year] Age: 24 y.o.  Date: 12/08/2014 Start Time: 10:00 AM End Time: 10:15 AM  Chief Complaint: ADHD   Subjective: I am doing well.  Patient is a 24 year old married white male lives with his wife.and 2 children . He now works at [employer] The patient has a history of ADHD but he can focus well at work with the Adderall XR. He's doing well at his job. Pt reports that he is compliant with the psychotropic medications with good benefit and no noticeable side effects. He is staying well focused. His temper is under good control. He is sleeping well.  His blood pressure is running  somewhat high but is better than last time. Dr. Sherril CroonVyas is put him on lisinopril and this may need to be adjusted. He's going back later this week. He has no specific complaints and enjoys his work   Vitals BP 140/100 211 lbs, 5'6"  Allergies: Allergies  Allergen Reactions  . Bee Venom Swelling  Medical History: Past Medical History  Diagnosis Date  . ADHD (attention deficit hyperactivity disorder)   . GERD (gastroesophageal reflux disease)   . 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 Hernia repair    . Anterior cruciate ligament repair      Left knee Dr. Edger HouseMcGinley  Family History: family history includes ADD / ADHD in his brother and maternal aunt;  Alcohol abuse in his maternal uncle; Drug abuse in his maternal uncle; Schizophrenia in his maternal uncle. There is no history of Anxiety disorder, Bipolar disorder, Dementia, Depression, OCD, Paranoid behavior, Seizures, Sexual abuse, or Physical abuse. Reviewed again in full today in the visit.  Mental Status Examination  Appearance: Casually dressed Alert: Yes Attention: fair  Cooperative: Yes Eye Contact: Good Speech: Normal in volume, rate, tone, spontaneous  Psychomotor Activity: Normal Memory/Concentration: OK Oriented: person, place and situation Mood: Euthymic Affect: Full Range Thought Processes and Associations: Goal Directed Fund of Knowledge: Fair Thought Content: Suicidal ideation, Homicidal ideation, Auditory hallucinations, Visual hallucinations, Delusions and Paranoia-none reported Insight: Good Judgement: Good  Lab Results:  Results for orders placed or performed during the hospital encounter of 03/02/14 (from the past 8736 hour(s))  Urinalysis, Routine w reflex microscopic   Collection Time: 03/02/14  7:30 PM  Result Value Ref Range   Color, Urine YELLOW YELLOW   APPearance CLEAR CLEAR   Specific Gravity, Urine >1.030 (H) 1.005 - 1.030   pH 6.0 5.0 - 8.0   Glucose, UA NEGATIVE NEGATIVE mg/dL   Hgb urine dipstick NEGATIVE NEGATIVE   Bilirubin Urine NEGATIVE NEGATIVE   Ketones, ur NEGATIVE NEGATIVE mg/dL   Protein, ur NEGATIVE NEGATIVE mg/dL   Urobilinogen, UA 0.2 0.0 - 1.0 mg/dL   Nitrite NEGATIVE NEGATIVE   Leukocytes, UA NEGATIVE NEGATIVE  CBC with Differential  Collection Time: 03/02/14  7:52 PM  Result Value Ref Range   WBC 7.3 4.0 - 10.5 K/uL   RBC 4.46 4.22 - 5.81 MIL/uL   Hemoglobin 13.8 13.0 - 17.0 g/dL   HCT 40.9 81.1 - 91.4 %   MCV 90.1 78.0 - 100.0 fL   MCH 30.9 26.0 - 34.0 pg   MCHC 34.3 30.0 - 36.0 g/dL   RDW 78.2 95.6 - 21.3 %   Platelets 225 150 - 400 K/uL   Neutrophils Relative % 49 43 - 77 %   Neutro Abs 3.5 1.7 - 7.7 K/uL    Lymphocytes Relative 39 12 - 46 %   Lymphs Abs 2.9 0.7 - 4.0 K/uL   Monocytes Relative 10 3 - 12 %   Monocytes Absolute 0.8 0.1 - 1.0 K/uL   Eosinophils Relative 2 0 - 5 %   Eosinophils Absolute 0.1 0.0 - 0.7 K/uL   Basophils Relative 0 0 - 1 %   Basophils Absolute 0.0 0.0 - 0.1 K/uL  CK   Collection Time: 03/02/14  7:52 PM  Result Value Ref Range   Total CK 465 (H) 7 - 232 U/L  I-Stat Troponin, ED   Collection Time: 03/02/14  8:13 PM  Result Value Ref Range   Troponin i, poc 0.00 0.00 - 0.08 ng/mL   Comment 3          I-Stat Chem 8, ED   Collection Time: 03/02/14  8:15 PM  Result Value Ref Range   Sodium 141 137 - 147 mEq/L   Potassium 4.0 3.7 - 5.3 mEq/L   Chloride 107 96 - 112 mEq/L   BUN 14 6 - 23 mg/dL   Creatinine, Ser 0.86 0.50 - 1.35 mg/dL   Glucose, Bld 84 70 - 99 mg/dL   Calcium, Ion 5.78 4.69 - 1.23 mmol/L   TCO2 22 0 - 100 mmol/L   Hemoglobin 15.3 13.0 - 17.0 g/dL   HCT 62.9 52.8 - 41.3 %   PCP orders annual labs with no concerns reported.  Diagnosis: ADHD combined type, moderate severity, intermittent explosive disorder  Plan: I took his vitals.  I reviewed CC, tobacco/med/surg Hx, meds effects/ side effects, problem list, therapies and responses as well as current situation/symptoms discussed options. Continue current effective medications. See primary physician about hypertension See orders and pt instructions for more details. MEDICATIONS this encounter:AdderallXR 30 mg QAM 3 months given   Medical Decision Making Problem Points:  Established problem, stable/improving (1), Review of last therapy session (1) and Review of psycho-social stressors (1) Data Points:  Review or order clinical lab tests (1) Review of medication regiment & side effects (2)  I certify that outpatient services furnished can reasonably be expected to improve the patient's condition.   Diannia Ruder, MD

## 2015-03-08 ENCOUNTER — Ambulatory Visit (HOSPITAL_COMMUNITY): Payer: Self-pay | Admitting: Psychiatry

## 2015-03-08 ENCOUNTER — Encounter (HOSPITAL_COMMUNITY): Payer: Self-pay | Admitting: *Deleted

## 2015-03-17 ENCOUNTER — Encounter (HOSPITAL_COMMUNITY): Payer: Self-pay | Admitting: Psychiatry

## 2015-03-17 ENCOUNTER — Ambulatory Visit (INDEPENDENT_AMBULATORY_CARE_PROVIDER_SITE_OTHER): Payer: MEDICAID | Admitting: Psychiatry

## 2015-03-17 VITALS — BP 143/85 | HR 70 | Ht 66.0 in | Wt 201.2 lb

## 2015-03-17 DIAGNOSIS — F902 Attention-deficit hyperactivity disorder, combined type: Secondary | ICD-10-CM

## 2015-03-17 MED ORDER — AMPHETAMINE-DEXTROAMPHET ER 30 MG PO CP24: 30.0000 mg | ORAL_CAPSULE | Freq: Every day | ORAL | Status: DC

## 2015-03-17 MED ORDER — AMPHETAMINE-DEXTROAMPHET ER 30 MG PO CP24: 30.0000 mg | ORAL_CAPSULE | ORAL | Status: DC

## 2015-03-17 NOTE — Progress Notes (Signed)
Patient ID: Darrelyn HillockCharlie D Taniguchi, male   DOB: Apr 01, 1991, 24 y.o.   MRN: [id number] Patient ID: Darrelyn HillockCharlie D Elgersma, male   DOB: Apr 01, 1991, 24 y.o.   MRN: [id number] Patient ID: Darrelyn HillockCharlie D Romero, male   DOB: Apr 01, 1991, 24 y.o.   MRN: [id number] Patient ID: Darrelyn HillockCharlie D Inghram, male   DOB: Apr 01, 1991, 24 y.o.   MRN: [id number] Patient ID: Darrelyn HillockCharlie D Gingras, male   DOB: Apr 01, 1991, 24 y.o.   MRN: [id number] Patient ID: Darrelyn HillockCharlie D Schwanke, male   DOB: Apr 01, 1991, 24 y.o.   MRN: [id number] Patient ID: Darrelyn HillockCharlie D Henk, male   DOB: Apr 01, 1991, 24 y.o.   MRN: [id number] [facility] [id number] Progress Note Darrelyn HillockCharlie D Uriostegui MRN: [id number] DOB: Apr 01, 1991 Age: 24 y.o.  Date: 03/17/2015 Start Time: 10:00 AM End Time: 10:15 AM  Chief Complaint: ADHD   Subjective: I am doing well.  Patient is a 24 year old married white male lives with his wife.and 2 children . He now works at [employer] The patient has a history of ADHD but he can focus well at work with the Adderall XR. He's doing well at his job. Pt reports that he is compliant with the psychotropic medications with good benefit and no noticeable side effects. He is staying well focused. His temper is under good control. He is sleeping well.  His blood pressure is improved and he is now on lisinopril. He does not have any specific complaints today   Vitals BP 143/85 201 lbs, 5'6"  Allergies: Allergies  Allergen Reactions  . Bee Venom Swelling  Medical History: Past Medical History  Diagnosis Date  . ADHD (attention deficit hyperactivity disorder)   . GERD (gastroesophageal reflux disease)   . 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 Hernia repair    . Anterior cruciate ligament repair      Left knee Dr. Edger HouseMcGinley  Family History: family history includes ADD / ADHD in his brother and maternal aunt; Alcohol abuse in his maternal uncle;  Drug abuse in his maternal uncle; Schizophrenia in his maternal uncle. There is no history of Anxiety disorder, Bipolar disorder, Dementia, Depression, OCD, Paranoid behavior, Seizures, Sexual abuse, or Physical abuse. Reviewed again in full today in the visit. Review of systems is reviewed and is totally negative Mental Status Examination  Appearance: Casually dressed Alert: Yes Attention: fair  Cooperative: Yes Eye Contact: Good Speech: Normal in volume, rate, tone, spontaneous  Psychomotor Activity: Normal Memory/Concentration: OK Oriented: person, place and situation Mood: Euthymic Affect: Full Range Thought Processes and Associations: Goal Directed Fund of Knowledge: Fair Thought Content: Suicidal ideation, Homicidal ideation, Auditory hallucinations, Visual hallucinations, Delusions and Paranoia-none reported Insight: Good Judgement: Good  Lab Results:  No results found for this or any previous visit (from the past 8736 hour(s)). PCP orders annual labs with no concerns reported.  Diagnosis: ADHD combined type, moderate severity, intermittent explosive disorder  Plan: I took his vitals.  I reviewed CC, tobacco/med/surg Hx, meds effects/ side effects, problem list, therapies and responses as well as current situation/symptoms discussed options. Continue Adderall XR 30 mg every morning. Continue follow-up with primary care for hypertension See orders and pt instructions for more details. MEDICATIONS this encounter:AdderallXR 30 mg QAM 3 months given   Medical Decision Making Problem Points:  Established problem, stable/improving (1), Review of last therapy session (1) and Review of psycho-social stressors (1) Data Points:  Review or order clinical lab  tests (1) Review of medication regiment & side effects (2)  I certify that outpatient services furnished can reasonably be expected to improve the patient's condition.   Levonne Spiller, MD

## 2015-06-17 ENCOUNTER — Encounter (HOSPITAL_COMMUNITY): Payer: Self-pay | Admitting: Psychiatry

## 2015-06-17 ENCOUNTER — Ambulatory Visit (INDEPENDENT_AMBULATORY_CARE_PROVIDER_SITE_OTHER): Payer: Medicaid Other | Admitting: Psychiatry

## 2015-06-17 VITALS — BP 137/92 | HR 55 | Ht 66.0 in | Wt 196.2 lb

## 2015-06-17 DIAGNOSIS — F6381 Intermittent explosive disorder: Secondary | ICD-10-CM | POA: Diagnosis not present

## 2015-06-17 DIAGNOSIS — F902 Attention-deficit hyperactivity disorder, combined type: Secondary | ICD-10-CM

## 2015-06-17 MED ORDER — AMPHETAMINE-DEXTROAMPHET ER 30 MG PO CP24: 30.0000 mg | ORAL_CAPSULE | ORAL | Status: DC

## 2015-06-17 MED ORDER — AMPHETAMINE-DEXTROAMPHET ER 30 MG PO CP24: 30.0000 mg | ORAL_CAPSULE | Freq: Every day | ORAL | Status: DC

## 2015-06-17 NOTE — Progress Notes (Signed)
Patient ID: JAIVEN GRAVELINE, male   DOB: [birth year], 24 y.o.   MRN: [id number] Patient ID: KYRILLOS ADAMS, male   DOB: Apr 17, 1991, 24 y.o.   MRN: [id number] Patient ID: CONSTANTINE RUDDICK, male   DOB: Jul 04, 1991, 24 y.o.   MRN: [id number] Patient ID: NIXON KOLTON, male   DOB: [birth year], 24 y.o.   MRN: [id number] Patient ID: HARTMAN MINAHAN, male   DOB: 10-Feb-1991, 24 y.o.   MRN: [id number] Patient ID: MICKEL SCHREUR, male   DOB: 1991-06-23, 24 y.o.   MRN: [id number] Patient ID: ZEIN HELBING, male   DOB: 1990/12/30, 24 y.o.   MRN: [id number] Patient ID: BENCE TRAPP, male   DOB: Dec 22, 1990, 24 y.o.   MRN: [id number] [facility] [id number] Progress Note SUSAN ARANA MRN: [id number] DOB: 26-Feb-1991 Age: 24 y.o.  Date: 06/17/2015 Start Time: 10:00 AM End Time: 10:15 AM  Chief Complaint: ADHD   Subjective: I am doing well.  Patient is a 24 year old married white male lives with his wife.and 2 children . He now works at [employer]. He is also taking a course in HVAC at [employer] The patient has a history of ADHD but he can focus well at work with the Adderall XR. He's doing well at his job. So far he is able to focus in this class Pt reports that he is compliant with the psychotropic medications with good benefit and no noticeable side effects. He is staying well focused. His temper is under good control. He is sleeping well.  His blood pressure is improved and he is now on lisinopril. It is a little high today but he claims he just took the pill. He does not have any specific complaints today. He has not had any further temper outbursts    Allergies: Allergies  Allergen Reactions  . Bee Venom Swelling  Medical History: Past Medical History  Diagnosis Date  . ADHD (attention deficit hyperactivity disorder)   . GERD (gastroesophageal reflux disease)   . 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 Hernia repair    . Anterior cruciate ligament repair      Left knee Dr. Edger House  Family History: family history includes ADD / ADHD in his brother and maternal aunt; Alcohol abuse in his maternal uncle; Drug abuse in his maternal uncle; Schizophrenia in his maternal uncle. There is no history of Anxiety disorder, Bipolar disorder, Dementia, Depression, OCD, Paranoid behavior, Seizures, Sexual abuse, or Physical abuse. Reviewed again in full today in the visit. Review of systems is reviewed and is positive for knee pain. He is probably going to have arthroscopic surgery in the next few months Mental Status Examination  Appearance: Casually dressed Alert: Yes Attention: fair  Cooperative: Yes Eye Contact: Good Speech: Normal in volume, rate, tone, spontaneous  Psychomotor Activity: Normal Memory/Concentration: OK Oriented: person, place and situation Mood: Euthymic Affect: Full Range Thought Processes and Associations: Goal Directed Fund of Knowledge: Fair Thought Content: Suicidal ideation, Homicidal ideation, Auditory hallucinations, Visual hallucinations, Delusions and Paranoia-none reported Insight: Good Judgement: Good  Lab Results:  No results found for this or any previous visit (from the past 8736 hour(s)). PCP orders annual labs with no concerns reported.  Diagnosis: ADHD combined type, moderate severity, intermittent explosive disorder  Plan: I took his vitals.  I reviewed CC, tobacco/med/surg Hx, meds effects/ side effects, problem list, therapies and responses as  well as current situation/symptoms discussed options. Continue Adderall XR 30 mg every morning. Continue follow-up with primary care for hypertension See orders and pt instructions for more details. MEDICATIONS this encounter:AdderallXR 30 mg QAM 3 months given   Medical Decision Making Problem Points:  Established problem, stable/improving (1),  Review of last therapy session (1) and Review of psycho-social stressors (1) Data Points:  Review or order clinical lab tests (1) Review of medication regiment & side effects (2)  I certify that outpatient services furnished can reasonably be expected to improve the patient's condition.   Diannia Ruder, MD

## 2015-08-18 ENCOUNTER — Emergency Department (HOSPITAL_COMMUNITY): Payer: Medicaid Other

## 2015-08-18 ENCOUNTER — Emergency Department (HOSPITAL_COMMUNITY)
Admission: EM | Admit: 2015-08-18 | Discharge: 2015-08-18 | Disposition: A | Discharge status: Home / Self Care | Payer: Medicaid Other | Attending: Emergency Medicine | Admitting: Emergency Medicine

## 2015-08-18 ENCOUNTER — Encounter (HOSPITAL_COMMUNITY): Payer: Self-pay

## 2015-08-18 DIAGNOSIS — K219 Gastro-esophageal reflux disease without esophagitis: Secondary | ICD-10-CM | POA: Diagnosis not present

## 2015-08-18 DIAGNOSIS — M79602 Pain in left arm: Secondary | ICD-10-CM | POA: Diagnosis not present

## 2015-08-18 DIAGNOSIS — Z79899 Other long term (current) drug therapy: Secondary | ICD-10-CM | POA: Diagnosis not present

## 2015-08-18 DIAGNOSIS — R200 Anesthesia of skin: Secondary | ICD-10-CM | POA: Insufficient documentation

## 2015-08-18 DIAGNOSIS — I10 Essential (primary) hypertension: Secondary | ICD-10-CM | POA: Diagnosis not present

## 2015-08-18 DIAGNOSIS — R079 Chest pain, unspecified: Secondary | ICD-10-CM | POA: Diagnosis present

## 2015-08-18 DIAGNOSIS — R0789 Other chest pain: Secondary | ICD-10-CM

## 2015-08-18 DIAGNOSIS — F909 Attention-deficit hyperactivity disorder, unspecified type: Secondary | ICD-10-CM | POA: Insufficient documentation

## 2015-08-18 DIAGNOSIS — Z791 Long term (current) use of non-steroidal anti-inflammatories (NSAID): Secondary | ICD-10-CM | POA: Diagnosis not present

## 2015-08-18 HISTORY — DX: Essential (primary) hypertension: I10

## 2015-08-18 MED ORDER — NAPROXEN 500 MG PO TABS: 500.0000 mg | ORAL_TABLET | Freq: Two times a day (BID) | ORAL | Status: DC

## 2015-08-18 MED ORDER — TRAMADOL HCL 50 MG PO TABS: 50.0000 mg | ORAL_TABLET | Freq: Four times a day (QID) | ORAL | Status: DC | PRN

## 2015-08-18 MED ORDER — OXYCODONE-ACETAMINOPHEN 5-325 MG PO TABS
1.0000 | ORAL_TABLET | Freq: Once | ORAL | Status: AC
  Administered 2015-08-18: 1 via ORAL
  Filled 2015-08-18: qty 1

## 2015-08-18 MED ORDER — KETOROLAC TROMETHAMINE 60 MG/2ML IM SOLN
60.0000 mg | Freq: Once | INTRAMUSCULAR | Status: AC
  Administered 2015-08-18: 60 mg via INTRAMUSCULAR
  Filled 2015-08-18: qty 2

## 2015-08-18 NOTE — Discharge Instructions (Signed)

## 2015-08-18 NOTE — ED Notes (Signed)
Pt reports onset of mid sternal chest pain last night, states this am the pain is worse, describes it as sharp pain and is worse with breathing and movement.  Pt states pain goes up into left side of neck and shoulder

## 2015-08-18 NOTE — ED Provider Notes (Signed)
CSN: [id number]     Arrival date & time 08/18/15  0429 History   First MD Initiated Contact with Patient 08/18/15 [phone]     Chief Complaint  Patient presents with  . Chest Pain     (Consider location/radiation/quality/duration/timing/severity/associated sxs/prior Treatment) HPI Comments: Patient presents to the ER for evaluation of chest pain. Patient reports that pain began last night around 10 PM. He reports a sharp pain in the center of his chest that has been constant since it started. Pain came on at rest. He has not identified any exertional component. There is no shortness of breath. Patient did feel some pain and numbness in his left arm last night. He has noticed that the pain goes into the left side of his neck and shoulder with movement. Pain is worsened with changing positions and turning his torso.  Patient is a 24 y.o. male presenting with chest pain.  Chest Pain   Past Medical History  Diagnosis Date  . ADHD (attention deficit hyperactivity disorder)   . GERD (gastroesophageal reflux disease)   . 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 Hypertension    Past Surgical History  Procedure Laterality Date  . Hernia repair    . Anterior cruciate ligament repair      Left knee Dr. Edger House   Family History  Problem Relation Age of Onset  . ADD / ADHD Brother   . ADD / ADHD Maternal Aunt   . Drug abuse Maternal Uncle   . Alcohol abuse Maternal Uncle   . Schizophrenia Maternal Uncle   . Anxiety disorder Neg Hx   . Bipolar disorder Neg Hx   . Dementia Neg Hx   . Depression Neg Hx   . OCD Neg Hx   . Paranoid behavior Neg Hx   . Seizures Neg Hx   . Sexual abuse Neg Hx   . Physical abuse Neg Hx    Social History  Substance Use Topics  . Smoking status: Never Smoker   . Smokeless tobacco: Never Used  . Alcohol Use: No    Review of Systems  Cardiovascular: Positive for chest pain.  All other systems reviewed and are  negative.     Allergies  Bee venom  Home Medications   Prior to Admission medications   Medication Sig Start Date End Date Taking? Authorizing Provider  amphetamine-dextroamphetamine (ADDERALL XR) 30 MG 24 hr capsule Take 1 capsule (30 mg total) by mouth daily. 06/17/15  Yes Myrlene Broker, MD  HYDROcodone-acetaminophen (NORCO) 7.5-325 MG per tablet Take 1 tablet by mouth every 6 (six) hours as needed for pain.   Yes Historical Provider, MD  lisinopril (PRINIVIL,ZESTRIL) 10 MG tablet Take 10 mg by mouth daily. 11/13/14  Yes Historical Provider, MD  omeprazole (PRILOSEC) 20 MG capsule Take 20 mg by mouth daily.   Yes Historical Provider, MD  amphetamine-dextroamphetamine (ADDERALL XR) 30 MG 24 hr capsule Take 1 capsule (30 mg total) by mouth every morning. 06/17/15   Myrlene Broker, MD  amphetamine-dextroamphetamine (ADDERALL XR) 30 MG 24 hr capsule Take 1 capsule (30 mg total) by mouth daily. 06/17/15   Myrlene Broker, MD  naproxen (NAPROSYN) 500 MG tablet Take 1 tablet (500 mg total) by mouth 2 (two) times daily. 03/02/14   Devoria Albe, MD   BP 132/97 mmHg  Pulse 84  Temp(Src) 98.5 F (36.9 C) (Oral)  Resp 19  Ht  (1.676 m)  Wt 195 lb (88.451  kg)  BMI 31.49 kg/m2  SpO2 98% Physical Exam  Constitutional: He is oriented to person, place, and time. He appears well-developed and well-nourished. No distress.  HENT:  Head: Normocephalic and atraumatic.  Right Ear: Hearing normal.  Left Ear: Hearing normal.  Nose: Nose normal.  Mouth/Throat: Oropharynx is clear and moist and mucous membranes are normal.  Eyes: Conjunctivae and EOM are normal. Pupils are equal, round, and reactive to light.  Neck: Normal range of motion. Neck supple.  Cardiovascular: Regular rhythm, S1 normal and S2 normal.  Exam reveals no gallop and no friction rub.   No murmur heard. Pulmonary/Chest: Effort normal and breath sounds normal. No respiratory distress. He exhibits no tenderness.  Abdominal: Soft. Normal  appearance and bowel sounds are normal. There is no hepatosplenomegaly. There is no tenderness. There is no rebound, no guarding, no tenderness at McBurney's point and negative Murphy's sign. No hernia.  Musculoskeletal: Normal range of motion.  Neurological: He is alert and oriented to person, place, and time. He has normal strength. No cranial nerve deficit or sensory deficit. Coordination normal. GCS eye subscore is 4. GCS verbal subscore is 5. GCS motor subscore is 6.  Skin: Skin is warm, dry and intact. No rash noted. No cyanosis.  Psychiatric: He has a normal mood and affect. His speech is normal and behavior is normal. Thought content normal.  Nursing note and vitals reviewed.   ED Course  Procedures (including critical care time) Labs Review Labs Reviewed - No data to display  Imaging Review No results found. I have personally reviewed and evaluated these images and lab results as part of my medical decision-making.   EKG Interpretation   Date/Time:  Thursday August 18 2015 04:36:50 EDT Ventricular Rate:  90 PR Interval:  167 QRS Duration: 86 QT Interval:  360 QTC Calculation: 440 R Axis:   66 Text Interpretation:  Sinus rhythm Normal ECG Confirmed by Amil Moseman  MD,  Ammie Warrick ([id number]) on 08/18/2015 4:39:11 AM      MDM   Final diagnoses:  None   chest wall pain  Patient presents with atypical chest pain. Patient had onset of a sharp pain in his chest at rest last night. Pain has been present since it began without improvement. Pain is reproducible with movement of his torso. His only cardiac risk factor is a history of hypertension. He is felt to be very low risk, as his pain is very atypical. EKG was unremarkable. Chest x-ray does not show any evidence of lung disease. Pain is most consistent with musculoskeletal chest pain. Patient is not hypoxic or tachycardic, PERC negative. Patient reassured, will treat for musculoskeletal chest pain.    Gilda Creasehristopher J Adaiah Morken,  MD 08/18/15 [phone]

## 2015-09-19 ENCOUNTER — Ambulatory Visit (INDEPENDENT_AMBULATORY_CARE_PROVIDER_SITE_OTHER): Payer: Medicaid Other | Admitting: Psychiatry

## 2015-09-19 ENCOUNTER — Encounter (HOSPITAL_COMMUNITY): Payer: Self-pay | Admitting: Psychiatry

## 2015-09-19 VITALS — BP 138/90 | HR 70 | Ht 66.0 in | Wt 196.6 lb

## 2015-09-19 DIAGNOSIS — F902 Attention-deficit hyperactivity disorder, combined type: Secondary | ICD-10-CM

## 2015-09-19 MED ORDER — AMPHETAMINE-DEXTROAMPHET ER 30 MG PO CP24: 30.0000 mg | ORAL_CAPSULE | Freq: Every day | ORAL | Status: DC

## 2015-09-19 MED ORDER — AMPHETAMINE-DEXTROAMPHET ER 30 MG PO CP24: 30.0000 mg | ORAL_CAPSULE | ORAL | Status: DC

## 2015-09-19 NOTE — Progress Notes (Signed)
Patient ID: NAHOME BUBLITZ, male   DOB: [birth year], 25 y.o.   MRN: [id number] Patient ID: TERESA LEMMERMAN, male   DOB: 1991/03/22, 24 y.o.   MRN: [id number] Patient ID: MALEKE FERIA, male   DOB: 01/22/1991, 24 y.o.   MRN: [id number] Patient ID: YVAN DORITY, male   DOB: Mar 07, 1991, 24 y.o.   MRN: [id number] Patient ID: PEDRAM GOODCHILD, male   DOB: [birth year], 24 y.o.   MRN: [id number] Patient ID: GINA COSTILLA, male   DOB: 02-07-1991, 24 y.o.   MRN: [id number] Patient ID: SUMNER KIRCHMAN, male   DOB: 11/06/1990, 24 y.o.   MRN: [id number] Patient ID: AMADU SCHLAGETER, male   DOB: 07-13-1991, 24 y.o.   MRN: [id number] Patient ID: MACIAH SCHWEIGERT, male   DOB: Jul 02, 1991, 24 y.o.   MRN: [id number] [facility] [address] Progress Note FREEMON BINFORD MRN: [id number] DOB: July 15, 1991 Age: 24 y.o.  Date: 09/19/2015 Start Time: 10:00 AM End Time: 10:15 AM  Chief Complaint: ADHD   Subjective: I am doing well.  Patient is a 24 year old married white male lives with his wife.and 2 children . He now works at [employer]. He is also taking a course in HVAC at [employer] The patient has a history of ADHD but he can focus well at work with the Adderall XR. He's doing well at his job. So far he is able to focus in this class Pt reports that he is compliant with the psychotropic medications with good benefit and no noticeable side effects. He is staying well focused. His temper is under good control. He is sleeping well.  His blood pressure is improved and he is now on lisinopril. It is a little high today but he claims he has been off his medicine for 2 days and has to pick it up today He does not have any specific complaints today. He has not had any further temper outbursts    Allergies: Allergies  Allergen Reactions  . Bee Venom Swelling  Medical History: Past Medical History  Diagnosis Date  . ADHD (attention deficit hyperactivity disorder)   .  GERD (gastroesophageal reflux disease)   . 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 Hypertension   Surgical History: Past Surgical History  Procedure Laterality Date  . Hernia repair    . Anterior cruciate ligament repair      Left knee Dr. Edger House  Family History: family history includes ADD / ADHD in his brother and maternal aunt; Alcohol abuse in his maternal uncle; Drug abuse in his maternal uncle; Schizophrenia in his maternal uncle. There is no history of Anxiety disorder, Bipolar disorder, Dementia, Depression, OCD, Paranoid behavior, Seizures, Sexual abuse, or Physical abuse. Reviewed again in full today in the visit. Review of systems is reviewed and is positive for knee pain. He is probably going to have arthroscopic surgery in the next few months Mental Status Examination  Appearance: Casually dressed Alert: Yes Attention: fair  Cooperative: Yes Eye Contact: Good Speech: Normal in volume, rate, tone, spontaneous  Psychomotor Activity: Normal Memory/Concentration: OK Oriented: person, place and situation Mood: Euthymic Affect: Full Range Thought Processes and Associations: Goal Directed Fund of Knowledge: Fair Thought Content: Suicidal ideation, Homicidal ideation, Auditory hallucinations, Visual hallucinations, Delusions and Paranoia-none reported Insight: Good Judgement: Good  Lab Results:  No results found for this or any previous visit (from the past 8736 hour(s)). PCP orders annual labs with no concerns reported.  Diagnosis: ADHD combined type, moderate severity, intermittent explosive disorder  Plan: I took his vitals.  I reviewed CC, tobacco/med/surg Hx, meds effects/ side effects, problem list, therapies and responses as well as current situation/symptoms discussed options. Continue Adderall XR 30 mg every morning. Continue follow-up with primary care for hypertension See orders and pt instructions for more details. MEDICATIONS  this encounter:AdderallXR 30 mg QAM 3 months given   Medical Decision Making Problem Points:  Established problem, stable/improving (1), Review of last therapy session (1) and Review of psycho-social stressors (1) Data Points:  Review or order clinical lab tests (1) Review of medication regiment & side effects (2)  I certify that outpatient services furnished can reasonably be expected to improve the patient's condition.   Diannia RuderOSS, Ganesh Deeg, MD

## 2015-11-16 ENCOUNTER — Encounter: Payer: Self-pay | Admitting: Orthopaedic Surgery

## 2015-11-16 ENCOUNTER — Ambulatory Visit (INDEPENDENT_AMBULATORY_CARE_PROVIDER_SITE_OTHER): Payer: Medicaid Other | Admitting: Orthopaedic Surgery

## 2015-11-16 VITALS — BP 150/91 | HR 89 | Temp 97.9°F | Ht 67.0 in | Wt 193.8 lb

## 2015-11-16 DIAGNOSIS — S83511D Sprain of anterior cruciate ligament of right knee, subsequent encounter: Secondary | ICD-10-CM

## 2015-11-16 DIAGNOSIS — S83511A Sprain of anterior cruciate ligament of right knee, initial encounter: Secondary | ICD-10-CM | POA: Insufficient documentation

## 2015-11-16 MED ORDER — HYDROCODONE-ACETAMINOPHEN 7.5-325 MG PO TABS: 1.0000 | ORAL_TABLET | ORAL | Status: DC | PRN

## 2015-11-16 NOTE — Progress Notes (Signed)
Subjective:     Patient ID: Joseph Blair, male   DOB: 1991/07/20, 25 y.o.   MRN: [id number]  HPI   Review of Systems  Musculoskeletal:       Joint complaint Knee pain stifness Swelling   All other systems reviewed and are negative.      Objective:   Physical Exam BP 150/91 mmHg  Pulse 89  Temp(Src) 97.9 F (36.6 C)  Ht  (1.702 m)  Wt 193 lb 12.8 oz (87.907 kg)  BMI 30.35 kg/m2     Assessment:         Plan:

## 2015-11-16 NOTE — Progress Notes (Signed)
UDS next visit 

## 2015-11-16 NOTE — Patient Instructions (Signed)
Continue medicine, present course.

## 2015-11-16 NOTE — Progress Notes (Signed)
Subjective:    Chief Complaint  Patient presents with  . Knee Pain    Patient ID: Joseph Blair, male   DOB: Nov 05, 1990, 25 y.o.   MRN: [id number]  Knee Pain  The injury mechanism was a twisting injury. The pain is present in the right knee. The quality of the pain is described as cramping and aching. The pain is at a severity of 4/10. The pain is mild. The pain has been fluctuating since onset. Associated symptoms comments: instabliity. The symptoms are aggravated by movement. He has tried ice and NSAIDs for the symptoms. The treatment provided mild relief.   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  He has been seen by Dr. Romeo Apple in the past.  Dr. Romeo Apple was in the office today and I discussed the situation with him again.  The patient has Medicaid and will not pay for physical therapy, therefore surgery cannot be done.  The patient has had more pain with the cooler weather and is limping more.  He has no new trauma.  He is tired of hurting.   Review of Systems  All other systems reviewed and are negative. He has prior ACL repair on the left knee and did well from that.  He had it when he was covered by insurance. Family history is negative.  Parents are alive.  No diseases other than hypertension in the family.    BP 150/91 mmHg  Pulse 89  Temp(Src) 97.9 F (36.6 C)  Ht  (1.702 m)  Wt 193 lb 12.8 oz (87.907 kg)  BMI 30.35 kg/m2 Objective:   Physical Exam  Constitutional: He is oriented to person, place, and time. He appears well-developed and well-nourished.  HENT:  Head: Normocephalic and atraumatic.  Eyes: Conjunctivae and EOM are normal. Pupils are equal, round, and reactive to light.  Neck: Normal range of motion.  Cardiovascular: Normal rate, regular rhythm and intact distal pulses.   Pulmonary/Chest: Effort normal.  Musculoskeletal: He exhibits tenderness (right knee with positive drawer sign, positive Lachman sign, mild effusion and crepitus.  ROM  0 to 115).       Right knee: He exhibits effusion.  Neurological: He is alert and oriented to person, place, and time. He has normal reflexes.  Cranial nerves intact.  Skin: Skin is warm and dry.  Psychiatric: He has a normal mood and affect. His behavior is normal. Judgment and thought content normal.  Right Knee Exam   Tenderness  The patient is experiencing tenderness in the medial joint line.  Range of Motion  Extension: 0  Flexion: 110   Tests  Lachman:  Anterior - 2+    Posterior - negative Drawer:       Anterior - 2+    Posterior - negative Pivot Shift: 1+ Patellar Apprehension: negative  Other  Sensation: normal Pulse: present Swelling: mild Other tests: effusion present  Comments:  Limp to the right, mild effusion, pain, instablity.   Left Knee Exam  Left knee exam is normal.  Comments:  Post ACL repair with well healed scars, knee stable.          Assessment:     ACL tear, instability of the right knee Post ACL repair on the left knee with good stability and good results      Plan:     My concern is that if his instability continues over time, he will get more problems with the knee from meniscus tears to  Progressive degenerative joint disease

## 2015-12-14 ENCOUNTER — Telehealth: Payer: Self-pay | Admitting: *Deleted

## 2015-12-14 MED ORDER — HYDROCODONE-ACETAMINOPHEN 7.5-325 MG PO TABS: 1.0000 | ORAL_TABLET | ORAL | Status: DC | PRN

## 2015-12-14 NOTE — Telephone Encounter (Signed)
Rx printed

## 2015-12-14 NOTE — Telephone Encounter (Signed)
Patient called requesting hydrocodone 7.5 325 qty 120 to be refilled. Please advise

## 2015-12-15 ENCOUNTER — Ambulatory Visit (HOSPITAL_COMMUNITY): Payer: Self-pay | Admitting: Psychiatry

## 2015-12-15 ENCOUNTER — Ambulatory Visit (INDEPENDENT_AMBULATORY_CARE_PROVIDER_SITE_OTHER): Payer: Medicaid Other | Admitting: Psychiatry

## 2015-12-15 ENCOUNTER — Encounter (HOSPITAL_COMMUNITY): Payer: Self-pay | Admitting: Psychiatry

## 2015-12-15 VITALS — BP 141/104 | HR 70 | Ht 67.0 in | Wt 201.8 lb

## 2015-12-15 DIAGNOSIS — F6381 Intermittent explosive disorder: Secondary | ICD-10-CM | POA: Diagnosis not present

## 2015-12-15 DIAGNOSIS — F902 Attention-deficit hyperactivity disorder, combined type: Secondary | ICD-10-CM

## 2015-12-15 MED ORDER — AMPHETAMINE-DEXTROAMPHET ER 30 MG PO CP24: 30.0000 mg | ORAL_CAPSULE | ORAL | Status: DC

## 2015-12-15 NOTE — Progress Notes (Signed)
Patient ID: Joseph Blair, male   DOB: March 24, 1991, 25 y.o.   MRN: [id number] Patient ID: Joseph Blair, male   DOB: [birth year], 25 y.o.   MRN: [id number] Patient ID: Joseph Blair, male   DOB: December 14, 1990, 25 y.o.   MRN: [id number] Patient ID: Joseph Blair, male   DOB: 26-Nov-1990, 25 y.o.   MRN: [id number] Patient ID: Joseph Blair, male   DOB: 1991-04-18, 25 y.o.   MRN: [id number] Patient ID: Joseph Blair, male   DOB: Jan 10, 1991, 25 y.o.   MRN: [id number] Patient ID: Joseph Blair, male   DOB: 11/18/1990, 25 y.o.   MRN: [id number] Patient ID: Joseph Blair, male   DOB: 06-Jul-1991, 25 y.o.   MRN: [id number] Patient ID: Joseph Blair, male   DOB: 22-Jun-1991, 25 y.o.   MRN: [id number] Patient ID: Joseph Blair, male   DOB: 1991-10-15, 25 y.o.   MRN: [id number] [facility] [id number] Progress Note Joseph Blair MRN: [id number] DOB: 05-25-1991 Age: 25 y.o.  Date: 12/15/2015 Start Time: 10:00 AM End Time: 10:15 AM  Chief Complaint: ADHD   Subjective: I am doing well.  Patient is a 25 year old married white male lives with his wife.and 2 children . He now works at [employer]. He is also taking a course in HVAC at [employer] The patient has a history of ADHD but he can focus well at work with the Adderall XR. He's doing well at his job. So far he is able to focus in this class Pt reports that he is compliant with the psychotropic medications with good benefit and no noticeable side effects. He is staying well focused. His temper is under good control. He is sleeping well.  His blood pressure is  high again today but he states he ran out of it yesterday and went to Dr. Letitia Neri office yesterday to get more and he hasn't picked it up yet. He claims when he checks it at home and is within the normal range.    Allergies: Allergies  Allergen Reactions  . Bee Venom Swelling  Medical History: Past Medical History  Diagnosis Date   . ADHD (attention deficit hyperactivity disorder)   . GERD (gastroesophageal reflux disease)   . 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 Hypertension   Surgical History: Past Surgical History  Procedure Laterality Date  . Hernia repair    . Anterior cruciate ligament repair      Left knee Dr. Edger House  Family History: family history includes ADD / ADHD in his brother and maternal aunt; Alcohol abuse in his maternal uncle; Drug abuse in his maternal uncle; Schizophrenia in his maternal uncle. There is no history of Anxiety disorder, Bipolar disorder, Dementia, Depression, OCD, Paranoid behavior, Seizures, Sexual abuse, or Physical abuse. Reviewed again in full today in the visit. Review of systems is reviewed and is positive for knee pain. He is probably going to have arthroscopic surgery in the next few months Mental Status Examination  Appearance: Casually dressed Alert: Yes Attention: fair  Cooperative: Yes Eye Contact: Good Speech: Normal in volume, rate, tone, spontaneous  Psychomotor Activity: Normal Memory/Concentration: OK Oriented: person, place and situation Mood: Euthymic Affect: Full Range Thought Processes and Associations: Goal Directed Fund of Knowledge: Fair Thought Content: Suicidal ideation, Homicidal ideation, Auditory hallucinations, Visual hallucinations, Delusions and Paranoia-none reported Insight: Good Judgement: Good  Lab Results:  No results found for this or any previous visit (from the  past 8736 hour(s)). PCP orders annual labs with no concerns reported.  Diagnosis: ADHD combined type, moderate severity, intermittent explosive disorder  Plan: I took his vitals.  I reviewed CC, tobacco/med/surg Hx, meds effects/ side effects, problem list, therapies and responses as well as current situation/symptoms discussed options. Continue Adderall XR 30 mg every morning. Continue follow-up with primary care for hypertension See  orders and pt instructions for more details. MEDICATIONS this encounter:AdderallXR 30 mg QAM 3 months given   Medical Decision Making Problem Points:  Established problem, stable/improving (1), Review of last therapy session (1) and Review of psycho-social stressors (1) Data Points:  Review or order clinical lab tests (1) Review of medication regiment & side effects (2)  I certify that outpatient services furnished can reasonably be expected to improve the patient's condition.   Diannia Ruder, MD

## 2015-12-16 ENCOUNTER — Ambulatory Visit (HOSPITAL_COMMUNITY): Payer: Self-pay | Admitting: Psychiatry

## 2016-01-16 ENCOUNTER — Telehealth: Payer: Self-pay | Admitting: Orthopaedic Surgery

## 2016-01-16 NOTE — Telephone Encounter (Signed)
Patient called and requested a refill on Hydrocodone-Acetaminophen  7.5-325 mgs.          Sig: Take 1 tablet by mouth every 4 (four) hours as needed for moderate pain. Reported on 11/16/2015

## 2016-01-16 NOTE — Telephone Encounter (Signed)
Patient requested Hydrocodone-Acetaminophen  7.5-325 mgs.  Qty  120        Sig: Take 1 tablet by mouth every 4 (four) hours as needed for moderate pain. Last filled  on 11/16/2015

## 2016-01-17 MED ORDER — HYDROCODONE-ACETAMINOPHEN 5-325 MG PO TABS: 1.0000 | ORAL_TABLET | ORAL | Status: DC | PRN

## 2016-01-17 NOTE — Telephone Encounter (Signed)
Rx done. 

## 2016-02-14 ENCOUNTER — Encounter: Payer: Self-pay | Admitting: Orthopaedic Surgery

## 2016-02-14 ENCOUNTER — Ambulatory Visit (INDEPENDENT_AMBULATORY_CARE_PROVIDER_SITE_OTHER): Payer: Medicaid Other | Admitting: Orthopaedic Surgery

## 2016-02-14 VITALS — BP 150/99 | HR 82 | Resp 12 | Ht 66.0 in | Wt 198.0 lb

## 2016-02-14 DIAGNOSIS — S83511D Sprain of anterior cruciate ligament of right knee, subsequent encounter: Secondary | ICD-10-CM | POA: Diagnosis not present

## 2016-02-14 MED ORDER — HYDROCODONE-ACETAMINOPHEN 5-325 MG PO TABS: 1.0000 | ORAL_TABLET | ORAL | Status: DC | PRN

## 2016-02-14 NOTE — Progress Notes (Signed)
Patient Joseph Blair:Joseph Blair, male DOB:[birth year], 25 y.o. QMV:[id number]:[id number]  No chief complaint on file. CC:  Right knee pain  HPI  Joseph HillockCharlie D Blair is a 25 y.o. male who has chronic right knee pain from a ACL tear. He has instability and giving way.  He has no locking.  He has swelling.  He has no new trauma.  HPI  Body mass index is 31.97 kg/(m^2).   Review of Systems  HENT: Negative for congestion.   Respiratory: Negative for cough and shortness of breath.   Cardiovascular: Negative for chest pain and leg swelling.  Endocrine: Negative for cold intolerance.  Musculoskeletal: Positive for joint swelling, arthralgias and gait problem.       Joint complaint Knee pain stifness Swelling   Allergic/Immunologic: Negative for environmental allergies.  All other systems reviewed and are negative.   Past Medical History  Diagnosis Date  . ADHD (attention deficit hyperactivity disorder)   . GERD (gastroesophageal reflux disease)   . 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 Hypertension     Past Surgical History  Procedure Laterality Date  . Hernia repair    . Anterior cruciate ligament repair      Left knee Dr. Edger HouseMcGinley    Family History  Problem Relation Age of Onset  . ADD / ADHD Brother   . ADD / ADHD Maternal Aunt   . Drug abuse Maternal Uncle   . Alcohol abuse Maternal Uncle   . Schizophrenia Maternal Uncle   . Anxiety disorder Neg Hx   . Bipolar disorder Neg Hx   . Dementia Neg Hx   . Depression Neg Hx   . OCD Neg Hx   . Paranoid behavior Neg Hx   . Seizures Neg Hx   . Sexual abuse Neg Hx   . Physical abuse Neg Hx     Social History Social History  Substance Use Topics  . Smoking status: Never Smoker   . Smokeless tobacco: Never Used  . Alcohol Use: No    Allergies  Allergen Reactions  . Bee Venom Swelling    Current Outpatient Prescriptions  Medication Sig Dispense Refill  . amphetamine-dextroamphetamine (ADDERALL  XR) 30 MG 24 hr capsule Take 1 capsule (30 mg total) by mouth every morning. 30 capsule 0  . amphetamine-dextroamphetamine (ADDERALL XR) 30 MG 24 hr capsule Take 1 capsule (30 mg total) by mouth every morning. 30 capsule 0  . amphetamine-dextroamphetamine (ADDERALL XR) 30 MG 24 hr capsule Take 1 capsule (30 mg total) by mouth every morning. 30 capsule 0  . HYDROcodone-acetaminophen (NORCO/VICODIN) 5-325 MG tablet Take 1 tablet by mouth every 4 (four) hours as needed for moderate pain (Must last 30 days.  Do not take and drive a car or use machinery.). 120 tablet 0  . lisinopril (PRINIVIL,ZESTRIL) 10 MG tablet Take 10 mg by mouth daily.  0  . naproxen (NAPROSYN) 500 MG tablet Take 1 tablet (500 mg total) by mouth 2 (two) times daily. 30 tablet 0  . omeprazole (PRILOSEC) 20 MG capsule Take 20 mg by mouth daily.     No current facility-administered medications for this visit.     Physical Exam  Blood pressure 150/99, pulse 82, resp. rate 12, height 5\' 6"  (1.676 m), weight 198 lb (89.812 kg).  Constitutional: overall normal hygiene, normal nutrition, well developed, normal grooming, normal body habitus. Assistive device:none  Musculoskeletal: gait and station Limp right, muscle tone and strength are normal, no tremors or  atrophy is present.  .  Neurological: coordination overall normal.  Deep tendon reflex/nerve stretch intact.  Sensation normal.  Cranial nerves II-XII intact.   Skin:   Scar left knee, but otherwise overall no scars, lesions, ulcers or rashes. No psoriasis.  Psychiatric: Alert and oriented x 3.  Recent memory intact, remote memory unclear.  Normal mood and affect. Well groomed.  Good eye contact.  Cardiovascular: overall no swelling, no varicosities, no edema bilaterally, normal temperatures of the legs and arms, no clubbing, cyanosis and good capillary refill.  Lymphatic: palpation is normal.  The right lower extremity is examined:  Inspection:  Thigh:  Non-tender and  no defects  Knee has swelling 1+ effusion.                        Joint tenderness is present                        Patient is tender over the medial joint line  Lower Leg:  Has normal appearance and no tenderness or defects  Ankle:  Non-tender and no defects  Foot:  Non-tender and no defects Range of Motion:  Knee:  Range of motion is: 0-110                        Crepitus is  present  Ankle:  Range of motion is normal. Strength and Tone:  The right lower extremity has normal strength and tone. Stability:  Knee:  The knee has positive drawer sign right 2+  Ankle:  The ankle is stable.   Left knee negative.  The patient has been educated about the nature of the problem(s) and counseled on treatment options.  The patient appeared to understand what I have discussed and is in agreement with it.  Encounter Diagnosis  Name Primary?  . Right ACL tear, subsequent encounter Yes    PLAN Call if any problems.  Precautions discussed.  Continue current medications.   Return to clinic 3 months

## 2016-02-29 ENCOUNTER — Emergency Department (HOSPITAL_COMMUNITY)
Admission: EM | Admit: 2016-02-29 | Discharge: 2016-02-29 | Disposition: A | Discharge status: Home / Self Care | Payer: Medicaid Other | Attending: Emergency Medicine | Admitting: Emergency Medicine

## 2016-02-29 ENCOUNTER — Encounter (HOSPITAL_COMMUNITY): Payer: Self-pay

## 2016-02-29 DIAGNOSIS — Z79899 Other long term (current) drug therapy: Secondary | ICD-10-CM | POA: Diagnosis not present

## 2016-02-29 DIAGNOSIS — R519 Headache, unspecified: Secondary | ICD-10-CM

## 2016-02-29 DIAGNOSIS — I10 Essential (primary) hypertension: Secondary | ICD-10-CM | POA: Diagnosis not present

## 2016-02-29 DIAGNOSIS — R51 Headache: Secondary | ICD-10-CM | POA: Insufficient documentation

## 2016-02-29 MED ORDER — ACETAMINOPHEN 500 MG PO TABS
1000.0000 mg | ORAL_TABLET | Freq: Once | ORAL | Status: AC
  Administered 2016-02-29: 1000 mg via ORAL
  Filled 2016-02-29: qty 2

## 2016-02-29 MED ORDER — LISINOPRIL 10 MG PO TABS
20.0000 mg | ORAL_TABLET | Freq: Once | ORAL | Status: AC
  Administered 2016-02-29: 20 mg via ORAL
  Filled 2016-02-29: qty 2

## 2016-02-29 NOTE — ED Notes (Signed)
Physician in to assess pt 

## 2016-02-29 NOTE — ED Notes (Signed)
Pt reports headache for the past few hours.  Reports has been out of his bp medication since Sunday.

## 2016-02-29 NOTE — ED Notes (Signed)
Education: HTN- kidney connection and the dangers of destruction of his renal function resulting in renal failure.

## 2016-02-29 NOTE — ED Provider Notes (Signed)
CSN: [id number]     Arrival date & time 02/29/16  1845 History   First MD Initiated Contact with Patient 02/29/16 2019     Chief Complaint  Patient presents with  . Headache     (Consider location/radiation/quality/duration/timing/severity/associated sxs/prior Treatment) Patient is a 25 y.o. male presenting with headaches. The history is provided by the patient.  Headache Associated symptoms: no back pain, no eye pain, no fever, no nausea, no neck pain, no neck stiffness, no numbness, no sinus pressure, no vomiting and no weakness   Patient c/o intermittent on and off headaches for the past couple days. Patient indicates he ran out of his bp med a few days ago as well - states called pcp office and plans to refill tomorrow.  Headaches are gradual in onset, mild-mod, similar to prior headaches. No acute and/or severe headache. Headache is frontal. Non radiating. No eye pain or change in vision. No neck pain/stiffness. No change in speech or vision, no numbness/weakness. Denies head injury. No syncope.  Denies sinus drainage or pain. No fever or chills. Patient also indicates needs a work note for [employer] as is missing work.      Past Medical History  Diagnosis Date  . ADHD (attention deficit hyperactivity disorder)   . GERD (gastroesophageal reflux disease)   . 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 Hypertension    Past Surgical History  Procedure Laterality Date  . Hernia repair    . Anterior cruciate ligament repair      Left knee Dr. Edger HouseMcGinley   Family History  Problem Relation Age of Onset  . ADD / ADHD Brother   . ADD / ADHD Maternal Aunt   . Drug abuse Maternal Uncle   . Alcohol abuse Maternal Uncle   . Schizophrenia Maternal Uncle   . Anxiety disorder Neg Hx   . Bipolar disorder Neg Hx   . Dementia Neg Hx   . Depression Neg Hx   . OCD Neg Hx   . Paranoid behavior Neg Hx   . Seizures Neg Hx   . Sexual abuse Neg Hx   . Physical abuse Neg  Hx    Social History  Substance Use Topics  . Smoking status: Never Smoker   . Smokeless tobacco: Never Used  . Alcohol Use: No    Review of Systems  Constitutional: Negative for fever.  HENT: Negative for sinus pressure.   Eyes: Negative for pain and visual disturbance.  Respiratory: Negative for shortness of breath.   Cardiovascular: Negative for chest pain.  Gastrointestinal: Negative for nausea and vomiting.  Endocrine: Negative for polyuria.  Genitourinary: Negative for flank pain.  Musculoskeletal: Negative for back pain, neck pain and neck stiffness.  Skin: Negative for rash.  Neurological: Positive for headaches. Negative for weakness and numbness.  Psychiatric/Behavioral: Negative for confusion.      Allergies  Bee venom  Home Medications   Prior to Admission medications   Medication Sig Start Date End Date Taking? Authorizing Provider  amphetamine-dextroamphetamine (ADDERALL XR) 30 MG 24 hr capsule Take 1 capsule (30 mg total) by mouth every morning. 12/15/15  Yes Myrlene Brokereborah R Ross, MD  HYDROcodone-acetaminophen (NORCO/VICODIN) 5-325 MG tablet Take 1 tablet by mouth every 4 (four) hours as needed for moderate pain (Must last 30 days.  Do not take and drive a car or use machinery.). 02/14/16  Yes Darreld McleanWayne Keeling, MD  lisinopril (PRINIVIL,ZESTRIL) 10 MG tablet Take 10 mg by mouth daily. 11/13/14  Yes Historical Provider, MD  omeprazole (PRILOSEC) 20 MG capsule Take 20 mg by mouth daily.   Yes Historical Provider, MD  amphetamine-dextroamphetamine (ADDERALL XR) 30 MG 24 hr capsule Take 1 capsule (30 mg total) by mouth every morning. Patient not taking: Reported on 02/29/2016 12/15/15   Myrlene Broker, MD  amphetamine-dextroamphetamine (ADDERALL XR) 30 MG 24 hr capsule Take 1 capsule (30 mg total) by mouth every morning. Patient not taking: Reported on 02/29/2016 12/15/15   Myrlene Broker, MD  naproxen (NAPROSYN) 500 MG tablet Take 1 tablet (500 mg total) by mouth 2 (two) times  daily. Patient not taking: Reported on 02/29/2016 08/18/15   Gilda Crease, MD   BP 153/108 mmHg  Pulse 66  Temp(Src) 98.4 F (36.9 C) (Oral)  Resp 20  Ht  (1.702 m)  Wt 88.451 kg  BMI 30.53 kg/m2  SpO2 100% Physical Exam  Constitutional: He is oriented to person, place, and time. He appears well-developed and well-nourished. No distress.  HENT:  Head: Atraumatic.  No sinus or temporal tenderness.   Eyes: Conjunctivae and EOM are normal. Pupils are equal, round, and reactive to light. No scleral icterus.  Neck: Neck supple. No tracheal deviation present.  No stiffness or rigidity  Cardiovascular: Normal rate, regular rhythm, normal heart sounds and intact distal pulses.  Exam reveals no gallop and no friction rub.   No murmur heard. Pulmonary/Chest: Effort normal and breath sounds normal. No accessory muscle usage. No respiratory distress. He exhibits no tenderness.  Abdominal: Soft. He exhibits no distension. There is no tenderness.  Musculoskeletal: Normal range of motion. He exhibits no edema.  Neurological: He is alert and oriented to person, place, and time. No cranial nerve deficit.  Motor intact bil, stre 5/5. sens grossly intact. Steady gait.   Skin: Skin is warm and dry. No rash noted. He is not diaphoretic.  Psychiatric: He has a normal mood and affect.  Nursing note and vitals reviewed.   ED Course  Procedures (including critical care time)   MDM   No meds for pain today.  Tylenol po.  Reviewed nursing notes and prior charts for additional history.   Patient indicates ran out of his bp med a few days ago, lisinopril, and plans to pick up at pcp office tomorrow. Lisinopril po.  Patient appears is no acute distress or severe pain, and currently appears stable for d/c.      Cathren Laine, MD 02/29/16 2055

## 2016-02-29 NOTE — ED Notes (Signed)
Pt DCd per MD instruct- Teachback with pt verbalizing understanding of followup and importance of staying on HTN meds

## 2016-02-29 NOTE — Discharge Instructions (Signed)
It was our pleasure to provide your ER care today - we hope that you feel better.  Follow up with your doctor tomorrow for recheck, and refill of your blood pressure medication - have your blood pressure rechecked then.  Take tylenol/motrin as need.  Return to ER if worse, new symptoms, severe pain, other concern.    Hypertension Hypertension, commonly called high blood pressure, is when the force of blood pumping through your arteries is too strong. Your arteries are the blood vessels that carry blood from your heart throughout your body. A blood pressure reading consists of a higher number over a lower number, such as 110/72. The higher number (systolic) is the pressure inside your arteries when your heart pumps. The lower number (diastolic) is the pressure inside your arteries when your heart relaxes. Ideally you want your blood pressure below 120/80. Hypertension forces your heart to work harder to pump blood. Your arteries may become narrow or stiff. Having untreated or uncontrolled hypertension can cause heart attack, stroke, kidney disease, and other problems. RISK FACTORS Some risk factors for high blood pressure are controllable. Others are not.  Risk factors you cannot control include:   Race. You may be at higher risk if you are African American.  Age. Risk increases with age.  Gender. Men are at higher risk than women before age 11 years. After age 74, women are at higher risk than men. Risk factors you can control include:  Not getting enough exercise or physical activity.  Being overweight.  Getting too much fat, sugar, calories, or salt in your diet.  Drinking too much alcohol. SIGNS AND SYMPTOMS Hypertension does not usually cause signs or symptoms. Extremely high blood pressure (hypertensive crisis) may cause headache, anxiety, shortness of breath, and nosebleed. DIAGNOSIS To check if you have hypertension, your health care provider will measure your blood pressure  while you are seated, with your arm held at the level of your heart. It should be measured at least twice using the same arm. Certain conditions can cause a difference in blood pressure between your right and left arms. A blood pressure reading that is higher than normal on one occasion does not mean that you need treatment. If it is not clear whether you have high blood pressure, you may be asked to return on a different day to have your blood pressure checked again. Or, you may be asked to monitor your blood pressure at home for 1 or more weeks. TREATMENT Treating high blood pressure includes making lifestyle changes and possibly taking medicine. Living a healthy lifestyle can help lower high blood pressure. You may need to change some of your habits. Lifestyle changes may include:  Following the DASH diet. This diet is high in fruits, vegetables, and whole grains. It is low in salt, red meat, and added sugars.  Keep your sodium intake below 2,300 mg per day.  Getting at least 30-45 minutes of aerobic exercise at least 4 times per week.  Losing weight if necessary.  Not smoking.  Limiting alcoholic beverages.  Learning ways to reduce stress. Your health care provider may prescribe medicine if lifestyle changes are not enough to get your blood pressure under control, and if one of the following is true:  You are 27-69 years of age and your systolic blood pressure is above 140.  You are 51 years of age or older, and your systolic blood pressure is above 150.  Your diastolic blood pressure is above 90.  You have diabetes,  and your systolic blood pressure is over 140 or your diastolic blood pressure is over 90.  You have kidney disease and your blood pressure is above 140/90.  You have heart disease and your blood pressure is above 140/90. Your personal target blood pressure may vary depending on your medical conditions, your age, and other factors. HOME CARE INSTRUCTIONS  Have your  blood pressure rechecked as directed by your health care provider.   Take medicines only as directed by your health care provider. Follow the directions carefully. Blood pressure medicines must be taken as prescribed. The medicine does not work as well when you skip doses. Skipping doses also puts you at risk for problems.  Do not smoke.   Monitor your blood pressure at home as directed by your health care provider. SEEK MEDICAL CARE IF:   You think you are having a reaction to medicines taken.  You have recurrent headaches or feel dizzy.  You have swelling in your ankles.  You have trouble with your vision. SEEK IMMEDIATE MEDICAL CARE IF:  You develop a severe headache or confusion.  You have unusual weakness, numbness, or feel faint.  You have severe chest or abdominal pain.  You vomit repeatedly.  You have trouble breathing. MAKE SURE YOU:   Understand these instructions.  Will watch your condition.  Will get help right away if you are not doing well or get worse.   This information is not intended to replace advice given to you by your health care provider. Make sure you discuss any questions you have with your health care provider.   Document Released: 10/01/2005 Document Revised: 02/15/2015 Document Reviewed: 07/24/2013 Elsevier Interactive Patient Education 2016 Elsevier Inc.     General Headache Without Cause A headache is pain or discomfort felt around the head or neck area. There are many causes and types of headaches. In some cases, the cause may not be found.  HOME CARE  Managing Pain  Take over-the-counter and prescription medicines only as told by your doctor.  Lie down in a dark, quiet room when you have a headache.  If directed, apply ice to the head and neck area:  Put ice in a plastic bag.  Place a towel between your skin and the bag.  Leave the ice on for 20 minutes, 2-3 times per day.  Use a heating pad or hot shower to apply heat to  the head and neck area as told by your doctor.  Keep lights dim if bright lights bother you or make your headaches worse. Eating and Drinking  Eat meals on a regular schedule.  Lessen how much alcohol you drink.  Lessen how much caffeine you drink, or stop drinking caffeine. General Instructions  Keep all follow-up visits as told by your doctor. This is important.  Keep a journal to find out if certain things bring on headaches. For example, write down:  What you eat and drink.  How much sleep you get.  Any change to your diet or medicines.  Relax by getting a massage or doing other relaxing activities.  Lessen stress.  Sit up straight. Do not tighten (tense) your muscles.  Do not use tobacco products. This includes cigarettes, chewing tobacco, or e-cigarettes. If you need help quitting, ask your doctor.  Exercise regularly as told by your doctor.  Get enough sleep. This often means 7-9 hours of sleep. GET HELP IF:  Your symptoms are not helped by medicine.  You have a headache that feels different  than the other headaches.  You feel sick to your stomach (nauseous) or you throw up (vomit).  You have a fever. GET HELP RIGHT AWAY IF:   Your headache becomes really bad.  You keep throwing up.  You have a stiff neck.  You have trouble seeing.  You have trouble speaking.  You have pain in the eye or ear.  Your muscles are weak or you lose muscle control.  You lose your balance or have trouble walking.  You feel like you will pass out (faint) or you pass out.  You have confusion.   This information is not intended to replace advice given to you by your health care provider. Make sure you discuss any questions you have with your health care provider.   Document Released: 07/10/2008 Document Revised: 06/22/2015 Document Reviewed: 01/24/2015 Elsevier Interactive Patient Education [employer]

## 2016-03-16 ENCOUNTER — Encounter (HOSPITAL_COMMUNITY): Payer: Self-pay | Admitting: Psychiatry

## 2016-03-16 ENCOUNTER — Ambulatory Visit (INDEPENDENT_AMBULATORY_CARE_PROVIDER_SITE_OTHER): Payer: Medicaid Other | Admitting: Psychiatry

## 2016-03-16 VITALS — BP 143/82 | HR 75 | Ht 67.0 in | Wt 194.8 lb

## 2016-03-16 DIAGNOSIS — F902 Attention-deficit hyperactivity disorder, combined type: Secondary | ICD-10-CM

## 2016-03-16 MED ORDER — AMPHETAMINE-DEXTROAMPHET ER 30 MG PO CP24: 30.0000 mg | ORAL_CAPSULE | ORAL | Status: DC

## 2016-03-16 NOTE — Progress Notes (Signed)
Patient ID: Joseph HillockCharlie D Reifsteck, male   DOB: September 02, 1991, 25 y.o.   MRN: [id number] Patient ID: Joseph HillockCharlie D Simone, male   DOB: September 02, 1991, 25 y.o.   MRN: [id number] Patient ID: Joseph HillockCharlie D Paxton, male   DOB: September 02, 1991, 25 y.o.   MRN: [id number] Patient ID: Joseph HillockCharlie D Gum, male   DOB: September 02, 1991, 25 y.o.   MRN: [id number] Patient ID: Joseph HillockCharlie D Wentworth, male   DOB: September 02, 1991, 25 y.o.   MRN: [id number] Patient ID: Joseph HillockCharlie D Everard, male   DOB: September 02, 1991, 25 y.o.   MRN: [id number] Patient ID: Joseph HillockCharlie D Sappington, male   DOB: September 02, 1991, 25 y.o.   MRN: [id number] Patient ID: Joseph HillockCharlie D Rosiles, male   DOB: September 02, 1991, 25 y.o.   MRN: [id number] Patient ID: Joseph HillockCharlie D Uphoff, male   DOB: September 02, 1991, 25 y.o.   MRN: [id number] Patient ID: Joseph HillockCharlie D Marteney, male   DOB: September 02, 1991, 25 y.o.   MRN: [id number] Patient ID: Joseph HillockCharlie D Weller, male   DOB: September 02, 1991, 25 y.o.   MRN: [id number] [facility] [id number] Progress Note Joseph Blair MRN: [id number] DOB: September 02, 1991 Age: 25 y.o.  Date: 03/16/2016 Start Time: 10:00 AM End Time: 10:15 AM  Chief Complaint: ADHD   Subjective: I am doing well.  Patient is a 25 year old married white male lives with his wife.and 2 children . He now works at [employer]. He is also taking a course in [employer] at [employer] The patient has a history of ADHD but he can focus well at work with the Adderall XR. He's doing well at his job. So far he is able to focus in this class Pt reports that he is compliant with the psychotropic medications with good benefit and no noticeable side effects. He is staying well focused. His temper is under good control. He is sleeping well.  His blood pressure is  better today. He recently went to the ED with hypertension and headache but he was only treated with ibuprofen. He is blood pressure is good as long as he stays on his medication.    Allergies: Allergies  Allergen Reactions  . Bee Venom Swelling  Medical  History: Past Medical History  Diagnosis Date  . ADHD (attention deficit hyperactivity disorder)   . GERD (gastroesophageal reflux disease)   . 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 Hypertension   Surgical History: Past Surgical History  Procedure Laterality Date  . Hernia repair    . Anterior cruciate ligament repair      Left knee Dr. Edger HouseMcGinley  Family History: family history includes ADD / ADHD in his brother and maternal aunt; Alcohol abuse in his maternal uncle; Drug abuse in his maternal uncle; Schizophrenia in his maternal uncle. There is no history of Anxiety disorder, Bipolar disorder, Dementia, Depression, OCD, Paranoid behavior, Seizures, Sexual abuse, or Physical abuse. Reviewed again in full today in the visit. Review of systems is reviewed and is positive for knee pain. He is probably going to have arthroscopic surgery in the next few months Mental Status Examination  Appearance: Casually dressed Alert: Yes Attention: fair  Cooperative: Yes Eye Contact: Good Speech: Normal in volume, rate, tone, spontaneous  Psychomotor Activity: Normal Memory/Concentration: OK Oriented: person, place and situation Mood: Euthymic Affect: Full Range Thought Processes and Associations: Goal Directed Fund of Knowledge: Fair Thought Content: Suicidal ideation, Homicidal ideation, Auditory hallucinations, Visual hallucinations, Delusions and Paranoia-none reported Insight: Good Judgement: Good  Lab Results:  No results found for this  or any previous visit (from the past 8736 hour(s)). PCP orders annual labs with no concerns reported.  Diagnosis: ADHD combined type, moderate severity, intermittent explosive disorder  Plan: I took his vitals.  I reviewed CC, tobacco/med/surg Hx, meds effects/ side effects, problem list, therapies and responses as well as current situation/symptoms discussed options. Continue Adderall XR 30 mg every morning. Continue  follow-up with primary care for hypertension See orders and pt instructions for more details. MEDICATIONS this encounter:AdderallXR 30 mg QAM 3 months given   Medical Decision Making Problem Points:  Established problem, stable/improving (1), Review of last therapy session (1) and Review of psycho-social stressors (1) Data Points:  Review or order clinical lab tests (1) Review of medication regiment & side effects (2)  I certify that outpatient services furnished can reasonably be expected to improve the patient's condition.   Diannia Ruder, MD

## 2016-03-20 ENCOUNTER — Telehealth: Payer: Self-pay | Admitting: Orthopaedic Surgery

## 2016-03-20 NOTE — Telephone Encounter (Signed)
Patient requests refill on: HYDROcodone-acetaminophen (NORCO/VICODIN) 5-325 MG tablet [[id number]][[id number]]

## 2016-03-21 MED ORDER — HYDROCODONE-ACETAMINOPHEN 5-325 MG PO TABS: 1.0000 | ORAL_TABLET | ORAL | Status: DC | PRN

## 2016-03-21 NOTE — Telephone Encounter (Signed)
Rx done. 

## 2016-03-27 ENCOUNTER — Telehealth (HOSPITAL_COMMUNITY): Payer: Self-pay | Admitting: *Deleted

## 2016-03-27 ENCOUNTER — Other Ambulatory Visit (HOSPITAL_COMMUNITY): Payer: Self-pay | Admitting: Psychiatry

## 2016-03-27 ENCOUNTER — Encounter (HOSPITAL_COMMUNITY): Payer: Self-pay | Admitting: *Deleted

## 2016-03-27 DIAGNOSIS — F902 Attention-deficit hyperactivity disorder, combined type: Secondary | ICD-10-CM

## 2016-03-27 MED ORDER — AMPHETAMINE-DEXTROAMPHET ER 30 MG PO CP24: 30.0000 mg | ORAL_CAPSULE | ORAL | Status: DC

## 2016-03-27 NOTE — Telephone Encounter (Signed)
done

## 2016-03-27 NOTE — Progress Notes (Signed)
Pt came into office to pick up his printed script that was requested during previous phone call. Pt did bring back printed script that was tore up. Those script numbers are [id number], [id number] and [id number]. New script ID numbers are [id number], [id number] and [id number]. Pt D/ L number is [id number] with expiration date of 08-07-2020. Pt showed understanding.

## 2016-03-27 NOTE — Telephone Encounter (Signed)
lmtcb number provided 

## 2016-03-27 NOTE — Telephone Encounter (Signed)
Pt called stating his son dropped water on his scripts for his Adderall and it got ripped when he was trying to dry it off. Per pt, he will being all three scripts to office for provider to see. Per pt, He would like to know if Provider could reprinted all three script for his Adderall again. [phone].

## 2016-03-27 NOTE — Telephone Encounter (Signed)
Pt is aware and came to office to pick up printed script.

## 2016-03-27 NOTE — Telephone Encounter (Signed)
Spoke with pt and he verbalized understanding

## 2016-04-05 ENCOUNTER — Observation Stay (HOSPITAL_COMMUNITY)
Admission: EM | Admit: 2016-04-05 | Discharge: 2016-04-07 | Disposition: A | Discharge status: Home / Self Care | Payer: Medicaid Other | Attending: Internal Medicine | Admitting: Internal Medicine

## 2016-04-05 ENCOUNTER — Other Ambulatory Visit: Payer: Self-pay

## 2016-04-05 ENCOUNTER — Encounter (HOSPITAL_COMMUNITY): Payer: Self-pay | Admitting: Emergency Medicine

## 2016-04-05 ENCOUNTER — Emergency Department (HOSPITAL_COMMUNITY): Payer: Medicaid Other

## 2016-04-05 DIAGNOSIS — R7989 Other specified abnormal findings of blood chemistry: Secondary | ICD-10-CM | POA: Diagnosis not present

## 2016-04-05 DIAGNOSIS — R9431 Abnormal electrocardiogram [ECG] [EKG]: Secondary | ICD-10-CM | POA: Diagnosis not present

## 2016-04-05 DIAGNOSIS — I309 Acute pericarditis, unspecified: Secondary | ICD-10-CM | POA: Diagnosis not present

## 2016-04-05 DIAGNOSIS — R778 Other specified abnormalities of plasma proteins: Secondary | ICD-10-CM | POA: Diagnosis present

## 2016-04-05 DIAGNOSIS — Z79899 Other long term (current) drug therapy: Secondary | ICD-10-CM | POA: Diagnosis not present

## 2016-04-05 DIAGNOSIS — I319 Disease of pericardium, unspecified: Principal | ICD-10-CM | POA: Insufficient documentation

## 2016-04-05 DIAGNOSIS — R0789 Other chest pain: Secondary | ICD-10-CM

## 2016-04-05 DIAGNOSIS — R079 Chest pain, unspecified: Secondary | ICD-10-CM | POA: Diagnosis present

## 2016-04-05 DIAGNOSIS — I10 Essential (primary) hypertension: Secondary | ICD-10-CM | POA: Diagnosis not present

## 2016-04-05 DIAGNOSIS — R072 Precordial pain: Secondary | ICD-10-CM | POA: Diagnosis present

## 2016-04-05 LAB — BASIC METABOLIC PANEL
Anion gap: 6 (ref 5–15)
BUN: 12 mg/dL (ref 6–20)
CALCIUM: 9.2 mg/dL (ref 8.9–10.3)
CO2: 26 mmol/L (ref 22–32)
CREATININE: 0.87 mg/dL (ref 0.61–1.24)
Chloride: 104 mmol/L (ref 101–111)
GFR calc Af Amer: >60 mL/min (ref >60)
GFR calc non Af Amer: >60 mL/min (ref >60)
GLUCOSE: 103 mg/dL — AB (ref 65–99)
Potassium: 3.8 mmol/L (ref 3.5–5.1)
Sodium: 136 mmol/L (ref 135–145)

## 2016-04-05 LAB — CBC WITH DIFFERENTIAL/PLATELET
BASOS ABS: 0 10*3/uL (ref 0.0–0.1)
BASOS PCT: 1 %
EOS ABS: 0.1 10*3/uL (ref 0.0–0.7)
Eosinophils Relative: 1 %
HEMATOCRIT: 38.8 % — AB (ref 39.0–52.0)
HEMOGLOBIN: 13.1 g/dL (ref 13.0–17.0)
Lymphocytes Relative: 32 %
Lymphs Abs: 2.1 10*3/uL (ref 0.7–4.0)
MCH: 30.8 pg (ref 26.0–34.0)
MCHC: 33.8 g/dL (ref 30.0–36.0)
MCV: 91.1 fL (ref 78.0–100.0)
MONOS PCT: 19 %
Monocytes Absolute: 1.2 10*3/uL — ABNORMAL HIGH (ref 0.1–1.0)
NEUTROS ABS: 3 10*3/uL (ref 1.7–7.7)
NEUTROS PCT: 47 %
Platelets: 219 10*3/uL (ref 150–400)
RBC: 4.26 MIL/uL (ref 4.22–5.81)
RDW: 12.5 % (ref 11.5–15.5)
WBC: 6.4 10*3/uL (ref 4.0–10.5)

## 2016-04-05 LAB — RAPID URINE DRUG SCREEN, HOSP PERFORMED
Amphetamines: NOT DETECTED
BARBITURATES: NOT DETECTED
Benzodiazepines: NOT DETECTED
Cocaine: NOT DETECTED
Opiates: NOT DETECTED
TETRAHYDROCANNABINOL: POSITIVE — AB

## 2016-04-05 LAB — TROPONIN I
TROPONIN I: 1.68 ng/mL — AB (ref <0.031)
Troponin I: 1.11 ng/mL (ref <0.031)
Troponin I: 1.77 ng/mL (ref <0.031)

## 2016-04-05 LAB — CK: CK TOTAL: 358 U/L (ref 49–397)

## 2016-04-05 LAB — I-STAT TROPONIN, ED: TROPONIN I, POC: 0.68 ng/mL — AB (ref 0.00–0.08)

## 2016-04-05 MED ORDER — SODIUM CHLORIDE 0.9 % IV SOLN: 250.0000 mL | INTRAVENOUS | Status: DC | PRN

## 2016-04-05 MED ORDER — OXYCODONE-ACETAMINOPHEN 5-325 MG PO TABS
1.0000 | ORAL_TABLET | ORAL | Status: DC | PRN
  Administered 2016-04-05 (×2): 1 via ORAL
  Administered 2016-04-06 (×2): 2 via ORAL
  Filled 2016-04-05: qty 2
  Filled 2016-04-05 (×2): qty 1
  Filled 2016-04-05: qty 2

## 2016-04-05 MED ORDER — SODIUM CHLORIDE 0.9% FLUSH
3.0000 mL | Freq: Two times a day (BID) | INTRAVENOUS | Status: DC
  Administered 2016-04-05 – 2016-04-06 (×3): 3 mL via INTRAVENOUS

## 2016-04-05 MED ORDER — SODIUM CHLORIDE 0.9 % IV BOLUS (SEPSIS)
1000.0000 mL | Freq: Once | INTRAVENOUS | Status: AC
  Administered 2016-04-05: 1000 mL via INTRAVENOUS

## 2016-04-05 MED ORDER — SODIUM CHLORIDE 0.9% FLUSH: 3.0000 mL | INTRAVENOUS | Status: DC | PRN

## 2016-04-05 MED ORDER — COLCHICINE 0.6 MG PO TABS
0.6000 mg | ORAL_TABLET | Freq: Once | ORAL | Status: AC
  Administered 2016-04-05: 0.6 mg via ORAL
  Filled 2016-04-05: qty 1

## 2016-04-05 MED ORDER — ASPIRIN 325 MG PO TABS
325.0000 mg | ORAL_TABLET | Freq: Once | ORAL | Status: AC
  Administered 2016-04-05: 325 mg via ORAL
  Filled 2016-04-05: qty 1

## 2016-04-05 MED ORDER — COLCHICINE 0.6 MG PO TABS
0.6000 mg | ORAL_TABLET | Freq: Two times a day (BID) | ORAL | Status: DC
  Administered 2016-04-05 – 2016-04-07 (×4): 0.6 mg via ORAL
  Filled 2016-04-05 (×4): qty 1

## 2016-04-05 MED ORDER — LISINOPRIL 10 MG PO TABS
10.0000 mg | ORAL_TABLET | Freq: Every day | ORAL | Status: DC
  Administered 2016-04-06 – 2016-04-07 (×2): 10 mg via ORAL
  Filled 2016-04-05 (×2): qty 1

## 2016-04-05 MED ORDER — PANTOPRAZOLE SODIUM 40 MG PO TBEC
40.0000 mg | DELAYED_RELEASE_TABLET | Freq: Every day | ORAL | Status: DC
  Administered 2016-04-06 – 2016-04-07 (×2): 40 mg via ORAL
  Filled 2016-04-05 (×2): qty 1

## 2016-04-05 MED ORDER — ACETAMINOPHEN 325 MG PO TABS
650.0000 mg | ORAL_TABLET | ORAL | Status: DC | PRN
  Administered 2016-04-07: 650 mg via ORAL
  Filled 2016-04-05: qty 2

## 2016-04-05 MED ORDER — ONDANSETRON HCL 4 MG/2ML IJ SOLN
4.0000 mg | Freq: Four times a day (QID) | INTRAMUSCULAR | Status: DC | PRN
  Administered 2016-04-06: 4 mg via INTRAVENOUS
  Filled 2016-04-05: qty 2

## 2016-04-05 MED ORDER — IBUPROFEN 600 MG PO TABS
600.0000 mg | ORAL_TABLET | Freq: Three times a day (TID) | ORAL | Status: DC
  Administered 2016-04-05 – 2016-04-07 (×5): 600 mg via ORAL
  Filled 2016-04-05 (×5): qty 1

## 2016-04-05 MED ORDER — AMPHETAMINE-DEXTROAMPHET ER 30 MG PO CP24
30.0000 mg | ORAL_CAPSULE | Freq: Every day | ORAL | Status: DC
  Administered 2016-04-06 – 2016-04-07 (×2): 30 mg via ORAL
  Filled 2016-04-05 (×2): qty 1

## 2016-04-05 MED ORDER — KETOROLAC TROMETHAMINE 30 MG/ML IJ SOLN
30.0000 mg | Freq: Once | INTRAMUSCULAR | Status: AC
  Administered 2016-04-05: 30 mg via INTRAVENOUS
  Filled 2016-04-05: qty 1

## 2016-04-05 NOTE — ED Notes (Signed)
Pt denies any pain at this time. nad

## 2016-04-05 NOTE — ED Notes (Addendum)
Pt reports generalized chest tightness and mild SOB since Tuesday. Pt also reports productive cough. NAD noted.

## 2016-04-05 NOTE — ED Notes (Signed)
Dr

## 2016-04-05 NOTE — ED Notes (Signed)
CRITICAL VALUE ALERT  Critical value received:  Troponin 1.11  Date of notification:  04/05/16  Time of notification:  1418  Critical value read back:Yes.    Nurse who received alert:  c Kalid Ghan rn  MD notified (1st page):  T Triplett who stated she will tell dr Juleen Chinakohut immedietly  Time of first page:    MD notified (2nd page):  Time of second page:  Responding MD:  Iva Lento Triplett  Time MD responded:  [phone]

## 2016-04-05 NOTE — ED Notes (Addendum)
Dr Arlean HoppingSchertz paged for additional orders,

## 2016-04-05 NOTE — H&P (Signed)
[facility] History and Physical  Joseph HillockCharlie D Wetmore ZOX:[id number] DOB: Oct 13, 1991 DOA: 04/05/2016  Referring physician: Dr. Juleen ChinaKohut PCP: Avon GullyFANTA,TESFAYE, MD   Chief Complaint: Chest pain  HPI: Joseph Blair is a 25 y.o. male with history of HTN and ADHD presenting to ED with chest pain.  EKG in ER showing diffuse ST elevation c/w pericarditis.  Asked to see for admit.   Chest pain is nonpleuritic, upper mid sternal region, not worse w position, nothing makes it better except Toradol he rec'd in ED earlier.  No cough, fevers, no hx heart disease, no recent exposure to anyone sick at home or work.  No abd pain, n/v/d, no dysuria, voiding w/o difficultly.   [place] up in [place], went to [employer], played basketball for the HS.  Tore ACL left leg had surgery, also tore R ACL hasn't had surgery yet.  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  Married 4 yrs, they have 3 children.  In his spare time he likes to fish, taught by his uncle.  He is accompanied here by his mother and wife.    ROS  denies CP  no joint pain   no HA  no blurry vision  no rash  no diarrhea  no nausea/ vomiting  no dysuria  no difficulty voiding  no change in urine color    Past Medical History  Past Medical History  Diagnosis Date  . ADHD (attention deficit hyperactivity disorder)   . GERD (gastroesophageal reflux disease)   . 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 Hypertension    Past Surgical History  Past Surgical History  Procedure Laterality Date  . Hernia repair    . Anterior cruciate ligament repair      Left knee Dr. Edger HouseMcGinley   Family History  Family History  Problem Relation Age of Onset  . ADD / ADHD Brother   . ADD / ADHD Maternal Aunt   . Drug abuse Maternal Uncle   . Alcohol abuse Maternal Uncle   . Schizophrenia Maternal Uncle   . Anxiety disorder Neg Hx   . Bipolar disorder Neg Hx   . Dementia  Neg Hx   . Depression Neg Hx   . OCD Neg Hx   . Paranoid behavior Neg Hx   . Seizures Neg Hx   . Sexual abuse Neg Hx   . Physical abuse Neg Hx    Social History  reports that he has never smoked. He has never used smokeless tobacco. He reports that he does not drink alcohol or use illicit drugs. Allergies  Allergies  Allergen Reactions  . Bee Venom Swelling   Home medications Prior to Admission medications   Medication Sig Start Date End Date Taking? Authorizing Provider  amphetamine-dextroamphetamine (ADDERALL XR) 30 MG 24 hr capsule Take 1 capsule (30 mg total) by mouth every morning. 03/27/16  Yes Myrlene Brokereborah R Ross, MD  HYDROcodone-acetaminophen (NORCO/VICODIN) 5-325 MG tablet Take 1 tablet by mouth every 4 (four) hours as needed for moderate pain (Must last 30 days.  Do not take and drive a car or use machinery.). 03/21/16  Yes Darreld McleanWayne Keeling, MD  lisinopril (PRINIVIL,ZESTRIL) 10 MG tablet Take 10 mg by mouth daily. 11/13/14  Yes Historical Provider, MD  omeprazole (PRILOSEC) 20 MG capsule Take 20 mg by mouth daily.   Yes Historical Provider, MD   Liver Function Tests No results for  input(s): AST, ALT, ALKPHOS, BILITOT, PROT, ALBUMIN in the last 168 hours. No results for input(s): LIPASE, AMYLASE in the last 168 hours. CBC  Recent Labs Lab 04/05/16 1214  WBC 6.4  NEUTROABS 3.0  HGB 13.1  HCT 38.8*  MCV 91.1  PLT 219   Basic Metabolic Panel  Recent Labs Lab 04/05/16 1214  NA 136  K 3.8  CL 104  CO2 26  GLUCOSE 103*  BUN 12  CREATININE 0.87  CALCIUM 9.2     Filed Vitals:   04/05/16 1530 04/05/16 1600 04/05/16 1700 04/05/16 1915  BP:  113/88 113/87 125/80  Pulse: 73 58 75 73  Temp:  97.9 F (36.6 C)  98.2 F (36.8 C)  TempSrc:    Oral  Resp: 15 20 16 20   Height:      Weight:      SpO2: 100% 100% 100% 100%   Exam: Gen alert No rash, cyanosis or gangrene Sclera anicteric, throat clear  No jvd or bruits Chest clear bilat RRR no MG, no rub heard, normal  HS Abd soft ntnd no mass or ascites +bs GU normal male MS no joint effusions or deformity Ext no LE edema / no wounds or ulcers Neuro is alert, Ox 3 , nf   EKG (independ reviewed) > NSR, diffuse ST elevation suggests acute pericarditis CXR (independ reviewed) > normal heart size and vascularity, normal CXR  Na 136 Cr 0.87  WBC 6k  Hb 13  plt 219  Trop 1.11 > 1.68 CPK 358  UDS +THC   Assessment: 1  Chest pain / troponin ^ and abnormal EKG, c/w myopericarditis, likely viral in origin per cardiology. Plan admit, NSAID 1-2 wks +  colchicine bid for 3 months. ECHO in the am.  Check serial trop 2  Elsie Saas^Trop - as above  Plan - as above     Maree KrabbeSCHERTZ,Savon Bordonaro D [facility] Pager [phone]  Cell [phone]  If 7PM-7AM, please contact night-coverage [email or web address] Password [email or web address] 04/05/2016, 7:39 PM

## 2016-04-05 NOTE — ED Notes (Signed)
Attempted to give report, floor stated will call back to get report.

## 2016-04-05 NOTE — ED Notes (Signed)
CRITICAL VALUE ALERT  Critical value received:  Troponin 1.68  Date of notification:  04/05/16  Time of notification:  0633  Critical value read back:Yes.    Nurse who received alert:  c Flannery Cavallero rn  MD notified: T Triplett/Dr Juleen ChinaKohut  Time of first page:    MD notified (2nd page):  Time of second page:  Responding MD:  T triplett/Dr Juleen ChinaKohut  Time MD responded:  [phone]

## 2016-04-05 NOTE — Consult Note (Signed)
CARDIOLOGY CONSULT NOTE  Patient ID: Darrelyn HillockCharlie D Cassel MRN: [id number] DOB/AGE: 1991-05-27 25 y.o.  Admit date: 04/05/2016 Primary Physician: Avon GullyFANTA,TESFAYE, MD Referring Physician: Pauline Ausammy Triplett PA  Reason for Consultation: chest pain, elevated troponin  HPI: 25 yr old male with h/o hypertension admitted with chest pain. 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 Went to work at 6 pm on Tuesday and about 1-2 hours later developed "an aching chest pain" retrosternally located and in the left side of chest. Denies radiation to arm, jaw, neck, and back. Denies associated shortness of breath. Denies palpitations, fevers, cough, runny nose, and myalgias. Pain has been relatively constant, waxing and waning in character. Denies leg swelling and orthopnea. Used to smoke cigars but no longer. BP and HR normal. UDS positive for marijuana. Was given Toradol and denies chest pain at present.  ECG which I personally interpreted shows ST elevations and PR depressions pathognomonic for pericarditis.  Troponins 1.11-->1.68.  CK 465-->358.  Chest xray normal.    Allergies  Allergen Reactions  . Bee Venom Swelling    No current facility-administered medications for this encounter.   Current Outpatient Prescriptions  Medication Sig Dispense Refill  . amphetamine-dextroamphetamine (ADDERALL XR) 30 MG 24 hr capsule Take 1 capsule (30 mg total) by mouth every morning. 30 capsule 0  . HYDROcodone-acetaminophen (NORCO/VICODIN) 5-325 MG tablet Take 1 tablet by mouth every 4 (four) hours as needed for moderate pain (Must last 30 days.  Do not take and drive a car or use machinery.). 100 tablet 0  . lisinopril (PRINIVIL,ZESTRIL) 10 MG tablet Take 10 mg by mouth daily.  0  . omeprazole (PRILOSEC) 20 MG capsule Take 20 mg by mouth daily.      Past Medical History  Diagnosis Date  . ADHD (attention deficit hyperactivity disorder)   . GERD (gastroesophageal reflux disease)    . 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 Hypertension     Past Surgical History  Procedure Laterality Date  . Hernia repair    . Anterior cruciate ligament repair      Left knee Dr. Edger HouseMcGinley    Social History   Social History  . Marital Status: Married    Spouse Name: N/A  . Number of Children: N/A  . Years of Education: N/A   Occupational History  . Not on file.   Social History Main Topics  . Smoking status: Never Smoker   . Smokeless tobacco: Never Used  . Alcohol Use: No  . Drug Use: No  . Sexual Activity: Yes   Other Topics Concern  . Not on file   Social History Narrative     No family history of premature CAD in 1st degree relatives.  Prior to Admission medications   Medication Sig Start Date End Date Taking? Authorizing Provider  amphetamine-dextroamphetamine (ADDERALL XR) 30 MG 24 hr capsule Take 1 capsule (30 mg total) by mouth every morning. 03/27/16  Yes Myrlene Brokereborah R Ross, MD  HYDROcodone-acetaminophen (NORCO/VICODIN) 5-325 MG tablet Take 1 tablet by mouth every 4 (four) hours as needed for moderate pain (Must last 30 days.  Do not take and drive a car or use machinery.). 03/21/16  Yes Darreld McleanWayne Keeling, MD  lisinopril (PRINIVIL,ZESTRIL) 10 MG tablet Take 10 mg by mouth daily. 11/13/14  Yes Historical Provider, MD  omeprazole (PRILOSEC) 20 MG capsule Take 20 mg by mouth daily.   Yes Historical Provider, MD  Review of systems complete and found to be negative unless listed above in HPI     Physical exam Blood pressure 113/88, pulse 58, temperature 97.9 F (36.6 C), temperature source Oral, resp. rate 20, height 5\' 9"  (1.753 m), weight 195 lb (88.451 kg), SpO2 100 %. General: NAD Neck: No JVD, no thyromegaly or thyroid nodule.  Lungs: Clear to auscultation bilaterally with normal respiratory effort. CV: Nondisplaced PMI. Regular rate and rhythm, normal S1/S2, no S3/S4, no murmur.  No peripheral edema.  No carotid bruit.   Normal pedal pulses.  Abdomen: Soft, nontender, no hepatosplenomegaly, no distention.  Skin: Intact without lesions or rashes.  Neurologic: Alert and oriented x 3.  Psych: Normal affect. Extremities: No clubbing or cyanosis.  HEENT: Normal.   ECG: Most recent ECG reviewed.  Labs:   Lab Results  Component Value Date   WBC 6.4 04/05/2016   HGB 13.1 04/05/2016   HCT 38.8* 04/05/2016   MCV 91.1 04/05/2016   PLT 219 04/05/2016    Recent Labs Lab 04/05/16 1214  NA 136  K 3.8  CL 104  CO2 26  BUN 12  CREATININE 0.87  CALCIUM 9.2  GLUCOSE 103*   Lab Results  Component Value Date   CKTOTAL 358 04/05/2016   TROPONINI 1.68* 04/05/2016   No results found for: CHOL No results found for: HDL No results found for: LDLCALC No results found for: TRIG No results found for: CHOLHDL No results found for: LDLDIRECT       Studies: Dg Chest 2 View  04/05/2016  CLINICAL DATA:  Cough.  Chest tightness EXAM: CHEST  2 VIEW COMPARISON:  08/18/2015 FINDINGS: Overpenetrated exam. Lungs are clear without infiltrate or effusion. Heart size and vascularity normal. IMPRESSION: No active cardiopulmonary disease. Electronically Signed   By: Marlan Palauharles  Clark M.D.   On: 04/05/2016 11:54    ASSESSMENT AND PLAN:  1. Chest pain and troponin elevation/myopericarditis: Troponin elevation with ECG features points to myopericarditis. No upper respiratory symptoms but likely viral in etiology. This does not represent an ACS and heparin is not indicated. However, would recommend checking serial troponins. Would treat with ibuprofen 600 mg tid for 1-2 weeks and colchicine 0.5 mg bid for three months. Would obtain echocardiogram as well.   Signed: Prentice DockerSuresh Koneswaran, M.D., F.A.C.C.  04/05/2016, 5:30 PM

## 2016-04-05 NOTE — ED Provider Notes (Signed)
CSN: [id number]     Arrival date & time 04/05/16  1108 History   First MD Initiated Contact with Patient 04/05/16 1126     Chief Complaint  Patient presents with  . Chest Pain     (Consider location/radiation/quality/duration/timing/severity/associated sxs/prior Treatment) HPI   Joseph Blair is a 25 y.o. male who presents to the Emergency Department complaining of gradual onset of substernal chest pain that began two days ago.  He describes the pain as sharp and constant, and associated with mild shortness of breath.  He states the pain resolved for a short time yesterday, but returned today.  He also admits to occasional cough, that has been productive at times and possible fever and chills at home although he has not checked his temperature.  Pain is not worsened by movement or deep breathing.  He denies drug use, extremity pain, N/V.  He has not tried any OTC medications for symptom relief.    Past Medical History  Diagnosis Date  . ADHD (attention deficit hyperactivity disorder)   . GERD (gastroesophageal reflux disease)   . 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 Hypertension    Past Surgical History  Procedure Laterality Date  . Hernia repair    . Anterior cruciate ligament repair      Left knee Dr. Edger House   Family History  Problem Relation Age of Onset  . ADD / ADHD Brother   . ADD / ADHD Maternal Aunt   . Drug abuse Maternal Uncle   . Alcohol abuse Maternal Uncle   . Schizophrenia Maternal Uncle   . Anxiety disorder Neg Hx   . Bipolar disorder Neg Hx   . Dementia Neg Hx   . Depression Neg Hx   . OCD Neg Hx   . Paranoid behavior Neg Hx   . Seizures Neg Hx   . Sexual abuse Neg Hx   . Physical abuse Neg Hx    Social History  Substance Use Topics  . Smoking status: Never Smoker   . Smokeless tobacco: Never Used  . Alcohol Use: No    Review of Systems  Constitutional: Negative for fever, chills and appetite change.   HENT: Negative for congestion, sore throat and trouble swallowing.   Respiratory: Positive for cough, chest tightness and shortness of breath. Negative for wheezing.   Cardiovascular: Positive for chest pain.  Gastrointestinal: Negative for nausea, vomiting and abdominal pain.  Genitourinary: Negative for dysuria.  Musculoskeletal: Negative for arthralgias.  Skin: Negative for rash.  Neurological: Negative for dizziness, weakness and numbness.  Hematological: Negative for adenopathy.  All other systems reviewed and are negative.     Allergies  Bee venom  Home Medications   Prior to Admission medications   Medication Sig Start Date End Date Taking? Authorizing Provider  amphetamine-dextroamphetamine (ADDERALL XR) 30 MG 24 hr capsule Take 1 capsule (30 mg total) by mouth every morning. 03/27/16   Myrlene Broker, MD  amphetamine-dextroamphetamine (ADDERALL XR) 30 MG 24 hr capsule Take 1 capsule (30 mg total) by mouth every morning. 03/27/16   Myrlene Broker, MD  amphetamine-dextroamphetamine (ADDERALL XR) 30 MG 24 hr capsule Take 1 capsule (30 mg total) by mouth every morning. 03/27/16   Myrlene Broker, MD  HYDROcodone-acetaminophen (NORCO/VICODIN) 5-325 MG tablet Take 1 tablet by mouth every 4 (four) hours as needed for moderate pain (Must last 30 days.  Do not take and drive a car or use machinery.). 03/21/16  Darreld McleanWayne Keeling, MD  lisinopril (PRINIVIL,ZESTRIL) 10 MG tablet Take 10 mg by mouth daily. 11/13/14   Historical Provider, MD  naproxen (NAPROSYN) 500 MG tablet Take 1 tablet (500 mg total) by mouth 2 (two) times daily. 08/18/15   Gilda Creasehristopher J Pollina, MD  omeprazole (PRILOSEC) 20 MG capsule Take 20 mg by mouth daily.    Historical Provider, MD   BP 140/98 mmHg  Pulse 87  Temp(Src) 98.3 F (36.8 C) (Oral)  Resp 18  Ht 5\' 9"  (1.753 m)  Wt 88.451 kg  BMI 28.78 kg/m2  SpO2 100% Physical Exam  Constitutional: He is oriented to person, place, and time. He appears well-developed and  well-nourished. No distress.  HENT:  Head: Normocephalic and atraumatic.  Right Ear: Tympanic membrane and ear canal normal.  Left Ear: Tympanic membrane and ear canal normal.  Mouth/Throat: Uvula is midline, oropharynx is clear and moist and mucous membranes are normal. No oropharyngeal exudate.  Eyes: EOM are normal. Pupils are equal, round, and reactive to light.  Neck: Normal range of motion, full passive range of motion without pain and phonation normal. Neck supple.  Cardiovascular: Normal rate, regular rhythm, normal heart sounds and intact distal pulses.   No murmur heard. Pulmonary/Chest: Effort normal and breath sounds normal. No stridor. No respiratory distress. He has no wheezes. He has no rales. He exhibits no tenderness.  Abdominal: Soft. He exhibits no distension. There is no tenderness. There is no rebound.  Musculoskeletal: He exhibits no edema.  Lymphadenopathy:    He has no cervical adenopathy.  Neurological: He is alert and oriented to person, place, and time. He exhibits normal muscle tone. Coordination normal.  Skin: Skin is warm and dry.  Psychiatric: He has a normal mood and affect.  Nursing note and vitals reviewed.   ED Course  Procedures (including critical care time) Labs Review Labs Reviewed  CBC WITH DIFFERENTIAL/PLATELET - Abnormal; Notable for the following:    HCT 38.8 (*)    Monocytes Absolute 1.2 (*)    All other components within normal limits  BASIC METABOLIC PANEL - Abnormal; Notable for the following:    Glucose, Bld 103 (*)    All other components within normal limits  TROPONIN I - Abnormal; Notable for the following:    Troponin I 1.11 (*)    All other components within normal limits  URINE RAPID DRUG SCREEN, HOSP PERFORMED - Abnormal; Notable for the following:    Tetrahydrocannabinol POSITIVE (*)    All other components within normal limits  TROPONIN I - Abnormal; Notable for the following:    Troponin I 1.68 (*)    All other  components within normal limits  I-STAT TROPOININ, ED - Abnormal; Notable for the following:    Troponin i, poc 0.68 (*)    All other components within normal limits  CK    Imaging Review Dg Chest 2 View  04/05/2016  CLINICAL DATA:  Cough.  Chest tightness EXAM: CHEST  2 VIEW COMPARISON:  08/18/2015 FINDINGS: Overpenetrated exam. Lungs are clear without infiltrate or effusion. Heart size and vascularity normal. IMPRESSION: No active cardiopulmonary disease. Electronically Signed   By: Marlan Palauharles  Clark M.D.   On: 04/05/2016 11:54   I have personally reviewed and evaluated these images and lab results as part of my medical decision-making.   EKG Interpretation   Date/Time:  Thursday April 05 2016 11:18:39 EDT Ventricular Rate:  86 PR Interval:    QRS Duration: 96 QT Interval:  362 QTC Calculation: 433  R Axis:   66 Text Interpretation:  Sinus rhythm ST elevation suggests acute  pericarditis diffuse STE. consider early repol. consider pericarditis.  change from previous 08/2015 Confirmed by Juleen ChinaKOHUT  MD, STEPHEN [id number]) on  04/05/2016 12:59:14 PM      MDM   Final diagnoses:  None    Pt well appearing, vitals stable.  Will check labs, EKG and CXR.  toradol given IM, ASA po  1440  Consulted cardiology, Dr. Purvis SheffieldKoneswaran, recommended colchicine, IVF's, repeat trop at 4:00 to look at trending.    1655  Patient resting comfortably, pain free at present, remains hemodynamically stable.  Consulted Dr. Kirtland BouchardK again, he recommends OBS admit for repeat trop and echocardiogram.  Will consult hospitalist for admit.    1718  End of shift, pt signed out to Dr. Juleen ChinaKohut.  Pauline Ausammy Neilah Fulwider, PA-C 04/05/16 1719  Raeford RazorStephen Kohut, MD 04/10/16 1135

## 2016-04-05 NOTE — ED Notes (Signed)
Pt was asleep when RN entered room, RN woke pt up to tell him that he was going upstairs and to update vitals, pt reports that his chest pain has returned, pain level is a 8 on pain scale,

## 2016-04-06 ENCOUNTER — Observation Stay (HOSPITAL_BASED_OUTPATIENT_CLINIC_OR_DEPARTMENT_OTHER): Payer: Medicaid Other

## 2016-04-06 DIAGNOSIS — Z79899 Other long term (current) drug therapy: Secondary | ICD-10-CM | POA: Diagnosis not present

## 2016-04-06 DIAGNOSIS — R9431 Abnormal electrocardiogram [ECG] [EKG]: Secondary | ICD-10-CM | POA: Diagnosis not present

## 2016-04-06 DIAGNOSIS — R079 Chest pain, unspecified: Secondary | ICD-10-CM | POA: Diagnosis not present

## 2016-04-06 DIAGNOSIS — R7989 Other specified abnormal findings of blood chemistry: Secondary | ICD-10-CM | POA: Diagnosis not present

## 2016-04-06 DIAGNOSIS — I319 Disease of pericardium, unspecified: Secondary | ICD-10-CM | POA: Diagnosis not present

## 2016-04-06 DIAGNOSIS — I10 Essential (primary) hypertension: Secondary | ICD-10-CM | POA: Diagnosis not present

## 2016-04-06 DIAGNOSIS — I309 Acute pericarditis, unspecified: Secondary | ICD-10-CM | POA: Diagnosis not present

## 2016-04-06 LAB — ECHOCARDIOGRAM COMPLETE
CHL CUP STROKE VOLUME: 57 mL
E decel time: 257 msec
EERAT: 7.25
FS: 32 % (ref 28–44)
HEIGHTINCHES: 69 in
IVS/LV PW RATIO, ED: 0.89
LA vol index: 20.5 mL/m2
LA vol: 43 mL
LADIAMINDEX: 1.96 cm/m2
LASIZE: 41 mm
LAVOLA4C: 42 mL
LEFT ATRIUM END SYS DIAM: 41 mm
LV E/e'average: 7.25
LV PW d: 9.31 mm — AB (ref 0.6–1.1)
LV SIMPSON'S DISK: 73
LV TDI E'MEDIAL: 10.2
LV dias vol index: 37 mL/m2
LV dias vol: 78 mL (ref 62–150)
LVEEMED: 7.25
LVELAT: 14.9 cm/s
LVOT area: 2.84 cm2
LVOT diameter: 19 mm
LVSYSVOL: 21 mL (ref 21–61)
LVSYSVOLIN: 10 mL/m2
Lateral S' vel: 14.8 cm/s
MV Dec: 257
MV Peak grad: 5 mmHg
MV pk A vel: 74.2 m/s
MVPKEVEL: 108 m/s
RV TAPSE: 20.3 mm
RV sys press: 21 mmHg
Reg peak vel: 212 cm/s
TDI e' lateral: 14.9
TRMAXVEL: 212 cm/s
Weight: 3120 oz

## 2016-04-06 LAB — TROPONIN I: Troponin I: 1.68 ng/mL (ref <0.031)

## 2016-04-06 NOTE — Progress Notes (Signed)
Have smelled odor of smoke in patient's room.  Spoke with patient about hospital policy and patient denies smoking in room.

## 2016-04-06 NOTE — Progress Notes (Signed)
   [facility]  Cardiology Specific Problem List: 1. Myopericarditis  Subjective:    Hungry, Inadvertently placed on a liquid diet. Still having chest soreness. Says Toradol was the most helpful instead of percocet.  Objective:   Temp:  [97.7 F (36.5 C)-98.3 F (36.8 C)] 97.7 F (36.5 C) (06/23 0600) Pulse Rate:  [58-87] 62 (06/23 0600) Resp:  [15-20] 20 (06/23 0600) BP: (113-140)/(80-98) 119/88 mmHg (06/23 0600) SpO2:  [100 %] 100 % (06/23 0600) FiO2 (%):  [21 %] 21 % (06/22 2036) Weight:  [195 lb (88.451 kg)] 195 lb (88.451 kg) (06/22 1111) Last BM Date: 04/05/16  Filed Weights   04/05/16 1111  Weight: 195 lb (88.451 kg)   No intake or output data in the 24 hours ending 04/06/16 0715  Telemetry:Sinus bradycardia. Rates in the 40's and 50's.   Exam:  General: No acute distress.  HEENT: Conjunctiva and lids normal, oropharynx clear.  Lungs: Clear to auscultation, nonlabored.  Cardiac: No elevated JVP or bruits. RRR, no gallop or rub.   Abdomen: Normoactive bowel sounds, nontender, nondistended.  Extremities: No pitting edema, distal pulses full.  Neuropsychiatric: Alert and oriented x3, affect appropriate.   Lab Results:  Basic Metabolic Panel:  Recent Labs Lab 04/05/16 1214  NA 136  K 3.8  CL 104  CO2 26  GLUCOSE 103*  BUN 12  CREATININE 0.87  CALCIUM 9.2    CBC:  Recent Labs Lab 04/05/16 1214  WBC 6.4  HGB 13.1  HCT 38.8*  MCV 91.1  PLT 219    Cardiac Enzymes:  Recent Labs Lab 04/05/16 1215 04/05/16 1547 04/05/16 2246  CKTOTAL  --  358  --   TROPONINI 1.11* 1.68* 1.77*     Radiology: Dg Chest 2 View  04/05/2016  CLINICAL DATA:  Cough.  Chest tightness EXAM: CHEST  2 VIEW COMPARISON:  08/18/2015 FINDINGS: Overpenetrated exam. Lungs are clear without infiltrate or effusion. Heart size and vascularity normal. IMPRESSION: No active cardiopulmonary disease. Electronically Signed   By: Marlan Palauharles  Clark M.D.    On: 04/05/2016 11:54     ECG: Global ST elevation indicative of pericarditis.    Medications:   Scheduled Medications: . amphetamine-dextroamphetamine  30 mg Oral QAC breakfast  . colchicine  0.6 mg Oral BID  . ibuprofen  600 mg Oral TID  . lisinopril  10 mg Oral Daily  . pantoprazole  40 mg Oral Daily  . sodium chloride flush  3 mL Intravenous Q12H    Infusions:    PRN Medications: sodium chloride, acetaminophen, ondansetron (ZOFRAN) IV, oxyCODONE-acetaminophen, sodium chloride flush   Assessment and Plan:   1.Myopericarditis: Troponin continues to rise, but pain is improved. Asking for more Toradol but is now on clchicine and Advil. Will not add more NSAIDs to avoid issues with GI disturbances. Continue PPI. Echo is pending today. Continue to follow. Change to regular diet.     Bettey MareKathryn M. Lawrence NP [facility]  04/06/2016, 7:15 AM   Pt seen and examined. Echo pending. Pain relieved from 2-4 am but has recurred. Constant. No shortness of breath or orthopnea. Vital stable. Continue ibuprofen and colchicine as stated yesterday. Should be able to be discharged after echo.

## 2016-04-07 MED ORDER — IBUPROFEN 600 MG PO TABS: 600.0000 mg | ORAL_TABLET | Freq: Three times a day (TID) | ORAL | Status: DC

## 2016-04-07 NOTE — Discharge Summary (Signed)
Physician Discharge Summary  Patient ID: Joseph Blair MRN: [id number] DOB/AGE: [birth year] 25 y.o. Primary Care Physician:Caedin Mogan, MD Admit date: 04/05/2016 Discharge date: 04/07/2016    Discharge Diagnoses:   Principal Problem:   Acute myopericarditis Active Problems:   Chest pain   Elevated troponin     Medication List    TAKE these medications        amphetamine-dextroamphetamine 30 MG 24 hr capsule  Commonly known as:  ADDERALL XR  Take 1 capsule (30 mg total) by mouth every morning.     HYDROcodone-acetaminophen 5-325 MG tablet  Commonly known as:  NORCO/VICODIN  Take 1 tablet by mouth every 4 (four) hours as needed for moderate pain (Must last 30 days.  Do not take and drive a car or use machinery.).     ibuprofen 600 MG tablet  Commonly known as:  ADVIL,MOTRIN  Take 1 tablet (600 mg total) by mouth 3 (three) times daily.     lisinopril 10 MG tablet  Commonly known as:  PRINIVIL,ZESTRIL  Take 10 mg by mouth daily.     omeprazole 20 MG capsule  Commonly known as:  PRILOSEC  Take 20 mg by mouth daily.        Discharged Condition: improved    Consults: cardiology  Significant Diagnostic Studies: Dg Chest 2 View  04/05/2016  CLINICAL DATA:  Cough.  Chest tightness EXAM: CHEST  2 VIEW COMPARISON:  08/18/2015 FINDINGS: Overpenetrated exam. Lungs are clear without infiltrate or effusion. Heart size and vascularity normal. IMPRESSION: No active cardiopulmonary disease. Electronically Signed   By: Marlan Palauharles  Clark M.Blair.   On: 04/05/2016 11:54    Lab Results: Basic Metabolic Panel:  Recent Labs  30/86/5706/22/17 1214  NA 136  K 3.8  CL 104  CO2 26  GLUCOSE 103*  BUN 12  CREATININE 0.87  CALCIUM 9.2   Liver Function Tests: No results for input(s): AST, ALT, ALKPHOS, BILITOT, PROT, ALBUMIN in the last 72 hours.   CBC:  Recent Labs  04/05/16 1214  WBC 6.4  NEUTROABS 3.0  HGB 13.1  HCT 38.8*  MCV 91.1  PLT 219    No results found for  this or any previous visit (from the past 240 hour(s)).   Hospital Course:   This is a 25 years old male who admitted viral myocarditis was evaluated by cardiology and was started on NSAID and colchicine. His echo was found to be normal. Patient's resolved and he feels much better. Patient is being discharged in stable condition to be followed in out patient.  Discharge Exam: Blood pressure 119/88, pulse 69, temperature 97.9 F (36.6 C), temperature source Oral, resp. rate 16, height 5\' 9"  (1.753 m), weight 88.451 kg (195 lb), SpO2 100 %.    Disposition:  home        Follow-up Information    [facility] 1 week.   Specialty:  Internal Medicine   Contact information:   [name] [place] [address] [phone]       Signed: Avon GullyFANTA,Chonita Gadea   04/07/2016, 8:42 AM

## 2016-04-26 ENCOUNTER — Telehealth: Payer: Self-pay | Admitting: Orthopaedic Surgery

## 2016-04-26 MED ORDER — HYDROCODONE-ACETAMINOPHEN 5-325 MG PO TABS: 1.0000 | ORAL_TABLET | ORAL | Status: DC | PRN

## 2016-04-26 NOTE — Telephone Encounter (Signed)
Patient called for refill of: HYDROcodone-acetaminophen (NORCO/VICODIN) 5-325 MG tablet [[id number]][[id number]] - 100 tablets

## 2016-04-26 NOTE — Telephone Encounter (Signed)
Rx done. 

## 2016-05-01 MED ORDER — HYDROCODONE-ACETAMINOPHEN 5-325 MG PO TABS: 1.0000 | ORAL_TABLET | ORAL | Status: DC | PRN

## 2016-05-01 NOTE — Telephone Encounter (Signed)
Rx done. 

## 2016-05-16 ENCOUNTER — Ambulatory Visit (INDEPENDENT_AMBULATORY_CARE_PROVIDER_SITE_OTHER): Payer: Medicaid Other | Admitting: Orthopaedic Surgery

## 2016-05-16 ENCOUNTER — Encounter: Payer: Self-pay | Admitting: Orthopaedic Surgery

## 2016-05-16 VITALS — BP 127/89 | HR 76 | Temp 98.1°F | Ht 67.75 in | Wt 196.8 lb

## 2016-05-16 DIAGNOSIS — S83511D Sprain of anterior cruciate ligament of right knee, subsequent encounter: Secondary | ICD-10-CM

## 2016-05-16 MED ORDER — HYDROCODONE-ACETAMINOPHEN 5-325 MG PO TABS: 1.0000 | ORAL_TABLET | ORAL | 0 refills | Status: DC | PRN

## 2016-05-16 NOTE — Progress Notes (Signed)
Patient Joseph Blair, male DOB:22-Jul-1991, 25 y.o. NWG:[id number]  Chief Complaint  Patient presents with  . Follow-up    Right ACL Tear    HPI  Joseph Blair is a 25 y.o. male who has right ACL tear of the knee. He has giving way of the knee and swelling and medial pain as well.  He has no new trauma.  HPI  Body mass index is 30.14 kg/m.  ROS  Review of Systems  HENT: Negative for congestion.   Respiratory: Negative for cough and shortness of breath.   Cardiovascular: Negative for chest pain and leg swelling.  Endocrine: Negative for cold intolerance.  Musculoskeletal: Positive for arthralgias, gait problem and joint swelling.       Joint complaint Knee pain stifness Swelling   Allergic/Immunologic: Negative for environmental allergies.  All other systems reviewed and are negative.   Past Medical History:  Diagnosis Date  . ADHD (attention deficit hyperactivity disorder)   . GERD (gastroesophageal reflux disease)   . Hypertension   . Pancreas disorder  two years ago   Hospitalized for Pancreatitis    Past Surgical History:  Procedure Laterality Date  . ANTERIOR CRUCIATE LIGAMENT REPAIR     Left knee Dr. Edger Blair  . HERNIA REPAIR      Family History  Problem Relation Age of Onset  . ADD / ADHD Brother   . ADD / ADHD Maternal Aunt   . Drug abuse Maternal Uncle   . Alcohol abuse Maternal Uncle   . Schizophrenia Maternal Uncle   . Anxiety disorder Neg Hx   . Bipolar disorder Neg Hx   . Dementia Neg Hx   . Depression Neg Hx   . OCD Neg Hx   . Paranoid behavior Neg Hx   . Seizures Neg Hx   . Sexual abuse Neg Hx   . Physical abuse Neg Hx     Social History Social History  Substance Use Topics  . Smoking status: Never Smoker  . Smokeless tobacco: Never Used  . Alcohol use No    Allergies  Allergen Reactions  . Bee Venom Swelling    Current Outpatient Prescriptions  Medication Sig Dispense Refill  . amphetamine-dextroamphetamine  (ADDERALL XR) 30 MG 24 hr capsule Take 1 capsule (30 mg total) by mouth every morning. 30 capsule 0  . HYDROcodone-acetaminophen (NORCO/VICODIN) 5-325 MG tablet Take 1 tablet by mouth every 4 (four) hours as needed for moderate pain (Must last 30 days.  Do not take and drive a car or use machinery.). 100 tablet 0  . ibuprofen (ADVIL,MOTRIN) 600 MG tablet Take 1 tablet (600 mg total) by mouth 3 (three) times daily. 30 tablet 0  . lisinopril (PRINIVIL,ZESTRIL) 10 MG tablet Take 10 mg by mouth daily.  0  . omeprazole (PRILOSEC) 20 MG capsule Take 20 mg by mouth daily.     No current facility-administered medications for this visit.      Physical Exam  Blood pressure 127/89, pulse 76, temperature 98.1 F (36.7 C), height 5' 7.75" (1.721 m), weight 196 lb 12.8 oz (89.3 kg).  Constitutional: overall normal hygiene, normal nutrition, well developed, normal grooming, normal body habitus. Assistive device:none  Musculoskeletal: gait and station Limp right, muscle tone and strength are normal, no tremors or atrophy is present.  .  Neurological: coordination overall normal.  Deep tendon reflex/nerve stretch intact.  Sensation normal.  Cranial nerves II-XII intact.   Skin:   normal overall no scars, lesions, ulcers or rashes. No psoriasis.  Psychiatric: Alert and oriented x 3.  Recent memory intact, remote memory unclear.  Normal mood and affect. Well groomed.  Good eye contact.  Cardiovascular: overall no swelling, no varicosities, no edema bilaterally, normal temperatures of the legs and arms, no clubbing, cyanosis and good capillary refill.  Lymphatic: palpation is normal.  The right lower extremity is examined:  Inspection:  Thigh:  Non-tender and no defects  Knee has swelling 1+ effusion.                        Joint tenderness is present                        Patient is tender over the medial joint line  Lower Leg:  Has normal appearance and no tenderness or defects  Ankle:   Non-tender and no defects  Foot:  Non-tender and no defects Range of Motion:  Knee:  Range of motion is: 0-115                        Crepitus is  present  Ankle:  Range of motion is normal. Strength and Tone:  The right lower extremity has normal strength and tone. Stability:  Knee:  The knee has positive Lachman and Drawer sign 2+  Ankle:  The ankle is stable.    The patient has been educated about the nature of the problem(s) and counseled on treatment options.  The patient appeared to understand what I have discussed and is in agreement with it.  Encounter Diagnosis  Name Primary?  . Right ACL tear, subsequent encounter Yes    PLAN Call if any problems.  Precautions discussed.  Continue current medications.   Return to clinic 3 months   Electronically Signed Darreld Mclean, MD 8/2/20171:45 PM

## 2016-06-15 ENCOUNTER — Ambulatory Visit (HOSPITAL_COMMUNITY): Payer: Medicaid Other | Admitting: Psychiatry

## 2016-06-15 ENCOUNTER — Encounter (HOSPITAL_COMMUNITY): Payer: Self-pay

## 2016-06-21 ENCOUNTER — Telehealth: Payer: Self-pay | Admitting: Orthopaedic Surgery

## 2016-06-21 NOTE — Telephone Encounter (Signed)
Hydrocodone-Acetaminophen  5/325mg   Qty 100 Tablets  Patient is Medicaid

## 2016-06-25 MED ORDER — HYDROCODONE-ACETAMINOPHEN 5-325 MG PO TABS: 1.0000 | ORAL_TABLET | Freq: Four times a day (QID) | ORAL | 0 refills | Status: DC | PRN

## 2016-06-28 ENCOUNTER — Encounter: Payer: Self-pay | Admitting: Orthopaedic Surgery

## 2016-06-29 ENCOUNTER — Encounter (HOSPITAL_COMMUNITY): Payer: Self-pay

## 2016-06-29 ENCOUNTER — Emergency Department (HOSPITAL_COMMUNITY)
Admission: EM | Admit: 2016-06-29 | Discharge: 2016-06-30 | Disposition: A | Discharge status: Home / Self Care | Payer: Medicaid Other | Attending: Emergency Medicine | Admitting: Emergency Medicine

## 2016-06-29 DIAGNOSIS — R197 Diarrhea, unspecified: Secondary | ICD-10-CM | POA: Insufficient documentation

## 2016-06-29 DIAGNOSIS — F909 Attention-deficit hyperactivity disorder, unspecified type: Secondary | ICD-10-CM | POA: Insufficient documentation

## 2016-06-29 DIAGNOSIS — R112 Nausea with vomiting, unspecified: Secondary | ICD-10-CM | POA: Diagnosis not present

## 2016-06-29 DIAGNOSIS — R1084 Generalized abdominal pain: Secondary | ICD-10-CM | POA: Diagnosis not present

## 2016-06-29 DIAGNOSIS — I10 Essential (primary) hypertension: Secondary | ICD-10-CM | POA: Diagnosis not present

## 2016-06-29 DIAGNOSIS — Z79899 Other long term (current) drug therapy: Secondary | ICD-10-CM | POA: Insufficient documentation

## 2016-06-29 LAB — COMPREHENSIVE METABOLIC PANEL
ALT: 75 U/L — AB (ref 17–63)
AST: 36 U/L (ref 15–41)
Albumin: 4.4 g/dL (ref 3.5–5.0)
Alkaline Phosphatase: 77 U/L (ref 38–126)
Anion gap: 9 (ref 5–15)
BUN: 13 mg/dL (ref 6–20)
CALCIUM: 9.3 mg/dL (ref 8.9–10.3)
CHLORIDE: 102 mmol/L (ref 101–111)
CO2: 25 mmol/L (ref 22–32)
CREATININE: 0.92 mg/dL (ref 0.61–1.24)
Glucose, Bld: 95 mg/dL (ref 65–99)
Potassium: 3.9 mmol/L (ref 3.5–5.1)
Sodium: 136 mmol/L (ref 135–145)
TOTAL PROTEIN: 7.7 g/dL (ref 6.5–8.1)
Total Bilirubin: 0.4 mg/dL (ref 0.3–1.2)

## 2016-06-29 LAB — CBC
HCT: 41.2 % (ref 39.0–52.0)
Hemoglobin: 14.2 g/dL (ref 13.0–17.0)
MCH: 31.4 pg (ref 26.0–34.0)
MCHC: 34.5 g/dL (ref 30.0–36.0)
MCV: 91.2 fL (ref 78.0–100.0)
PLATELETS: 225 10*3/uL (ref 150–400)
RBC: 4.52 MIL/uL (ref 4.22–5.81)
RDW: 13.2 % (ref 11.5–15.5)
WBC: 8.5 10*3/uL (ref 4.0–10.5)

## 2016-06-29 LAB — URINALYSIS, ROUTINE W REFLEX MICROSCOPIC
Bilirubin Urine: NEGATIVE
Glucose, UA: NEGATIVE mg/dL
Ketones, ur: NEGATIVE mg/dL
LEUKOCYTES UA: NEGATIVE
NITRITE: NEGATIVE
PROTEIN: NEGATIVE mg/dL
Specific Gravity, Urine: 1.02 (ref 1.005–1.030)
pH: 6.5 (ref 5.0–8.0)

## 2016-06-29 LAB — LIPASE, BLOOD: LIPASE: 19 U/L (ref 11–51)

## 2016-06-29 LAB — URINE MICROSCOPIC-ADD ON

## 2016-06-29 MED ORDER — ONDANSETRON 4 MG PO TBDP
4.0000 mg | ORAL_TABLET | Freq: Once | ORAL | Status: AC
  Administered 2016-06-29: 4 mg via ORAL
  Filled 2016-06-29: qty 1

## 2016-06-29 NOTE — ED Provider Notes (Signed)
[facility]-EMERGENCY DEPT Provider Note   CSN: [id number] Arrival date & time: 06/29/16  2200  By signing my name below, I, Vista Mink, attest that this documentation has been prepared under the direction and in the presence of Devoria Albe, MD. Electronically signed, Vista Mink, ED Scribe. 06/30/16. 12:29 AM.  Time Seen 23:03 PM  History   Chief Complaint Chief Complaint  Patient presents with  . Emesis    HPI HPI Comments: Joseph Blair is a 25 y.o. male who presents to the Emergency Department complaining of cramping diffuse abdominal pain with associated nausea, vomiting and diarrhea, onset this morning. Pt states he started to feel nauseas this morning before work and reports 4 episodes of vomiting today and 3 episodes of watery diarrhea. Last episode of vomiting was at 2100 this evening. He went to work. He states he has been able to tolerate some oral fluids. . He denies any exacerbating or alleviating factors to his abdominal pain. No known sick contacts or food ingestion that could have made him ill. He occasionally smokes black and milds. No etOH use. He denies fever, dizziness or lightheadedness.   PCP Dr Felecia Shelling  The history is provided by the patient. No language interpreter was used.    Past Medical History:  Diagnosis Date  . ADHD (attention deficit hyperactivity disorder)   . GERD (gastroesophageal reflux disease)   . Hypertension   . Pancreas disorder  two years ago   Hospitalized for Pancreatitis    Patient Active Problem List   Diagnosis Date Noted  . Acute myopericarditis 04/05/2016  . Chest pain 04/05/2016  . Elevated troponin 04/05/2016  . Right ACL tear 11/16/2015  . ACL (anterior cruciate ligament) rupture 05/05/2013  . 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 Intermittent explosive disorder 10/03/2011    Past Surgical History:  Procedure Laterality Date  . ANTERIOR CRUCIATE LIGAMENT REPAIR     Left knee Dr.  Edger House  . HERNIA REPAIR        Home Medications    Colchicine Zyrtec generic  Prior to Admission medications   Medication Sig Start Date End Date Taking? Authorizing Provider  amphetamine-dextroamphetamine (ADDERALL XR) 30 MG 24 hr capsule Take 1 capsule (30 mg total) by mouth every morning. 03/27/16  Yes Myrlene Broker, MD  colchicine 0.6 MG tablet Take 0.6 mg by mouth 2 (two) times daily. 04/25/16  Yes Historical Provider, MD  HYDROcodone-acetaminophen (NORCO/VICODIN) 5-325 MG tablet Take 1 tablet by mouth every 6 (six) hours as needed for moderate pain (Must last 14 days.Do not take and drive a car or use machinery.). 06/25/16  Yes Darreld Mclean, MD  lisinopril (PRINIVIL,ZESTRIL) 10 MG tablet Take 10 mg by mouth daily. 11/13/14  Yes Historical Provider, MD  omeprazole (PRILOSEC) 20 MG capsule Take 20 mg by mouth daily.   Yes Historical Provider, MD  ibuprofen (ADVIL,MOTRIN) 600 MG tablet Take 1 tablet (600 mg total) by mouth 3 (three) times daily. Patient not taking: Reported on 06/29/2016 04/07/16   Avon Gully, MD  ondansetron (ZOFRAN) 4 MG tablet Take 1 tablet (4 mg total) by mouth every 8 (eight) hours as needed for nausea or vomiting. 06/30/16   Devoria Albe, MD    Family History Family History  Problem Relation Age of Onset  . ADD / ADHD Brother   . ADD / ADHD Maternal Aunt   . Drug abuse Maternal Uncle   . Alcohol abuse Maternal Uncle   . Schizophrenia Maternal Uncle   .  Anxiety disorder Neg Hx   . Bipolar disorder Neg Hx   . Dementia Neg Hx   . Depression Neg Hx   . OCD Neg Hx   . Paranoid behavior Neg Hx   . Seizures Neg Hx   . Sexual abuse Neg Hx   . Physical abuse Neg Hx     Social History Social History  Substance Use Topics  . Smoking status: Never Smoker  . Smokeless tobacco: Never Used  . Alcohol use No  employed   Allergies   Bee venom   Review of Systems Review of Systems  Constitutional: Negative for fever.  Gastrointestinal: Positive for  abdominal pain (upper), diarrhea, nausea and vomiting.  Neurological: Negative for dizziness and light-headedness.  All other systems reviewed and are negative.    Physical Exam Updated Vital Signs BP 152/94 (BP Location: Left Arm)   Pulse 68   Temp 98.6 F (37 C) (Oral)   Resp 20   Ht 5\' 7"  (1.702 m)   Wt 200 lb (90.7 kg)   SpO2 99%   BMI 31.32 kg/m   Vital signs normal   Physical Exam  Constitutional: He is oriented to person, place, and time. He appears well-developed and well-nourished.  Non-toxic appearance. He does not appear ill. No distress.  HENT:  Head: Normocephalic and atraumatic.  Right Ear: External ear normal.  Left Ear: External ear normal.  Nose: Nose normal. No mucosal edema or rhinorrhea.  Mouth/Throat: Mucous membranes are normal. No dental abscesses or uvula swelling.  Tongue dry.  Eyes: Conjunctivae and EOM are normal. Pupils are equal, round, and reactive to light.  Neck: Normal range of motion and full passive range of motion without pain. Neck supple.  Cardiovascular: Normal rate, regular rhythm and normal heart sounds.  Exam reveals no gallop and no friction rub.   No murmur heard. Pulmonary/Chest: Effort normal and breath sounds normal. No respiratory distress. He has no wheezes. He has no rhonchi. He has no rales. He exhibits no tenderness and no crepitus.  Abdominal: Soft. Normal appearance and bowel sounds are normal. He exhibits no distension. There is tenderness. There is no rebound and no guarding.    Diffuse upper abdominal discomfort  Musculoskeletal: Normal range of motion. He exhibits no edema or tenderness.  Moves all extremities well.   Neurological: He is alert and oriented to person, place, and time. He has normal strength. No cranial nerve deficit.  Skin: Skin is warm, dry and intact. No rash noted. No erythema. No pallor.  Psychiatric: He has a normal mood and affect. His speech is normal and behavior is normal. His mood appears  not anxious.  Nursing note and vitals reviewed.    ED Treatments / Results  Labs (all labs ordered are listed, but only abnormal results are displayed)  Results for orders placed or performed during the hospital encounter of 06/29/16  Lipase, blood  Result Value Ref Range   Lipase 19 11 - 51 U/L  Comprehensive metabolic panel  Result Value Ref Range   Sodium 136 135 - 145 mmol/L   Potassium 3.9 3.5 - 5.1 mmol/L   Chloride 102 101 - 111 mmol/L   CO2 25 22 - 32 mmol/L   Glucose, Bld 95 65 - 99 mg/dL   BUN 13 6 - 20 mg/dL   Creatinine, Ser 1.47 0.61 - 1.24 mg/dL   Calcium 9.3 8.9 - 82.9 mg/dL   Total Protein 7.7 6.5 - 8.1 g/dL   Albumin 4.4 3.5 -  5.0 g/dL   AST 36 15 - 41 U/L   ALT 75 (H) 17 - 63 U/L   Alkaline Phosphatase 77 38 - 126 U/L   Total Bilirubin 0.4 0.3 - 1.2 mg/dL   GFR calc non Af Amer >60 >60 mL/min   GFR calc Af Amer >60 >60 mL/min   Anion gap 9 5 - 15  CBC  Result Value Ref Range   WBC 8.5 4.0 - 10.5 K/uL   RBC 4.52 4.22 - 5.81 MIL/uL   Hemoglobin 14.2 13.0 - 17.0 g/dL   HCT 16.141.2 09.639.0 - 04.552.0 %   MCV 91.2 78.0 - 100.0 fL   MCH 31.4 26.0 - 34.0 pg   MCHC 34.5 30.0 - 36.0 g/dL   RDW 40.913.2 81.111.5 - 91.415.5 %   Platelets 225 150 - 400 K/uL  Urinalysis, Routine w reflex microscopic  Result Value Ref Range   Color, Urine YELLOW YELLOW   APPearance CLEAR CLEAR   Specific Gravity, Urine 1.020 1.005 - 1.030   pH 6.5 5.0 - 8.0   Glucose, UA NEGATIVE NEGATIVE mg/dL   Hgb urine dipstick TRACE (A) NEGATIVE   Bilirubin Urine NEGATIVE NEGATIVE   Ketones, ur NEGATIVE NEGATIVE mg/dL   Protein, ur NEGATIVE NEGATIVE mg/dL   Nitrite NEGATIVE NEGATIVE   Leukocytes, UA NEGATIVE NEGATIVE  Urine microscopic-add on  Result Value Ref Range   Squamous Epithelial / LPF 0-5 (A) NONE SEEN   WBC, UA 0-5 0 - 5 WBC/hpf   RBC / HPF 0-5 0 - 5 RBC/hpf   Bacteria, UA FEW (A) NONE SEEN   Laboratory interpretation all normal except one elevated LFT, discussed with patient to f/u with  his PCP about this.   Procedures Procedures (including critical care time)  Medications Ordered in ED Medications  ondansetron (ZOFRAN-ODT) disintegrating tablet 4 mg (4 mg Oral Given 06/29/16 2245)     Initial Impression / Assessment and Plan / ED Course  I have reviewed the triage vital signs and the nursing notes.  Pertinent labs & imaging results that were available during my care of the patient were reviewed by me and considered in my medical decision making (see chart for details).  Clinical Course  DIAGNOSTIC STUDIES: Oxygen Saturation is 99% on RA, normal by my interpretation.  COORDINATION OF CARE: 12:04 AM-Will order Zofran. Discussed treatment plan with pt at bedside and pt agreed to plan. Pt given option of trying oral fluids or IV fluids, and he chose oral   12:27 AM-Pt's nausea has subsided after treatment. Feels ready to be discharged. Discussed his one elevated LFT, to be rechecked by his PCP.     Final Clinical Impressions(s) / ED Diagnoses   Final diagnoses:  Nausea vomiting and diarrhea    New Prescriptions New Prescriptions   ONDANSETRON (ZOFRAN) 4 MG TABLET    Take 1 tablet (4 mg total) by mouth every 8 (eight) hours as needed for nausea or vomiting.   Plan discharge  Devoria AlbeIva Chavela Justiniano, MD, FACEP  I personally performed the services described in this documentation, which was scribed in my presence. The recorded information has been reviewed and considered.  Devoria AlbeIva Ario Mcdiarmid, MD, Concha PyoFACEP     Tysean Vandervliet, MD 06/30/16 [phone]

## 2016-06-29 NOTE — ED Triage Notes (Signed)
NVD starting today.

## 2016-06-30 MED ORDER — ONDANSETRON HCL 4 MG PO TABS: 4.0000 mg | ORAL_TABLET | Freq: Three times a day (TID) | ORAL | 0 refills | Status: DC | PRN

## 2016-06-30 NOTE — Discharge Instructions (Signed)
Drink plenty of fluids (clear liquids) this morning then start a bland diet such as toast, jello, crackers, or [employer] chicken noodle soup. Use the zofran for nausea or vomiting. Take imodium OTC for diarrhea. Avoid mild products until the diarrhea is gone. Recheck if you get worse.

## 2016-07-18 ENCOUNTER — Telehealth: Payer: Self-pay | Admitting: Orthopaedic Surgery

## 2016-07-18 NOTE — Telephone Encounter (Signed)
Routing to Dr. [name] to advise 

## 2016-07-18 NOTE — Telephone Encounter (Signed)
Hydrocodone-Acetaminophen  5/325mg  Qty 40 Tablets ° °Take 1 tablet by mouth every 6 (six) hours as needed for moderate pain (Must last 14 days. Do not take and drive a car or use machinery.). °

## 2016-07-18 NOTE — Telephone Encounter (Signed)
yes

## 2016-07-23 ENCOUNTER — Telehealth (HOSPITAL_COMMUNITY): Payer: Self-pay | Admitting: *Deleted

## 2016-07-23 MED ORDER — HYDROCODONE-ACETAMINOPHEN 5-325 MG PO TABS: 1.0000 | ORAL_TABLET | Freq: Four times a day (QID) | ORAL | 0 refills | Status: DC | PRN

## 2016-07-23 NOTE — Telephone Encounter (Signed)
patient canceled appointment for 07/24/16, due to work.   He said he will call back when he can reschedule.

## 2016-07-23 NOTE — Telephone Encounter (Signed)
Routing to Dr. Keeling 

## 2016-07-24 ENCOUNTER — Ambulatory Visit (HOSPITAL_COMMUNITY): Payer: Self-pay | Admitting: Psychiatry

## 2016-08-15 ENCOUNTER — Ambulatory Visit: Payer: Self-pay | Admitting: Orthopaedic Surgery

## 2016-08-21 ENCOUNTER — Encounter: Payer: Self-pay | Admitting: Orthopaedic Surgery

## 2016-08-21 ENCOUNTER — Ambulatory Visit (INDEPENDENT_AMBULATORY_CARE_PROVIDER_SITE_OTHER): Payer: Medicaid Other | Admitting: Orthopaedic Surgery

## 2016-08-21 VITALS — BP 137/101 | HR 94 | Temp 97.9°F | Ht 67.5 in | Wt 204.0 lb

## 2016-08-21 DIAGNOSIS — S83511D Sprain of anterior cruciate ligament of right knee, subsequent encounter: Secondary | ICD-10-CM

## 2016-08-21 DIAGNOSIS — G8929 Other chronic pain: Secondary | ICD-10-CM

## 2016-08-21 DIAGNOSIS — M25561 Pain in right knee: Secondary | ICD-10-CM | POA: Diagnosis not present

## 2016-08-21 MED ORDER — HYDROCODONE-ACETAMINOPHEN 5-325 MG PO TABS: 1.0000 | ORAL_TABLET | Freq: Four times a day (QID) | ORAL | 0 refills | Status: DC | PRN

## 2016-08-21 NOTE — Progress Notes (Signed)
Patient ZO:XWRUEAV:Joseph Blair, male DOB:05-30-1991, 25 y.o. WUJ:[id number]  Chief Complaint  Patient presents with  . Follow-up    Right knee pain    HPI  Joseph Blair is a 25 y.o. male who has chronic right knee pain secondary to ACL tear.  He has swelling and popping and giving way at times.  He has no new trauma.  He tries to be active. HPI  Body mass index is 31.48 kg/m.  ROS  Review of Systems  HENT: Negative for congestion.   Respiratory: Negative for cough and shortness of breath.   Cardiovascular: Negative for chest pain and leg swelling.  Endocrine: Negative for cold intolerance.  Musculoskeletal: Positive for arthralgias, gait problem and joint swelling.       Joint complaint Knee pain stifness Swelling   Allergic/Immunologic: Negative for environmental allergies.  All other systems reviewed and are negative.   Past Medical History:  Diagnosis Date  . ADHD (attention deficit hyperactivity disorder)   . GERD (gastroesophageal reflux disease)   . Hypertension   . Pancreas disorder  two years ago   Hospitalized for Pancreatitis    Past Surgical History:  Procedure Laterality Date  . ANTERIOR CRUCIATE LIGAMENT REPAIR     Left knee Dr. Edger HouseMcGinley  . HERNIA REPAIR      Family History  Problem Relation Age of Onset  . ADD / ADHD Brother   . ADD / ADHD Maternal Aunt   . Drug abuse Maternal Uncle   . Alcohol abuse Maternal Uncle   . Schizophrenia Maternal Uncle   . Anxiety disorder Neg Hx   . Bipolar disorder Neg Hx   . Dementia Neg Hx   . Depression Neg Hx   . OCD Neg Hx   . Paranoid behavior Neg Hx   . Seizures Neg Hx   . Sexual abuse Neg Hx   . Physical abuse Neg Hx     Social History Social History  Substance Use Topics  . Smoking status: Never Smoker  . Smokeless tobacco: Never Used  . Alcohol use No    Allergies  Allergen Reactions  . Bee Venom Swelling    Current Outpatient Prescriptions  Medication Sig Dispense Refill  .  amphetamine-dextroamphetamine (ADDERALL XR) 30 MG 24 hr capsule Take 1 capsule (30 mg total) by mouth every morning. 30 capsule 0  . colchicine 0.6 MG tablet Take 0.6 mg by mouth 2 (two) times daily.  0  . HYDROcodone-acetaminophen (NORCO/VICODIN) 5-325 MG tablet Take 1 tablet by mouth every 6 (six) hours as needed for moderate pain (Must last 14 days.Do not take and drive a car or use machinery.). 35 tablet 0  . ibuprofen (ADVIL,MOTRIN) 600 MG tablet Take 1 tablet (600 mg total) by mouth 3 (three) times daily. 30 tablet 0  . lisinopril (PRINIVIL,ZESTRIL) 10 MG tablet Take 10 mg by mouth daily.  0  . omeprazole (PRILOSEC) 20 MG capsule Take 20 mg by mouth daily.    . ondansetron (ZOFRAN) 4 MG tablet Take 1 tablet (4 mg total) by mouth every 8 (eight) hours as needed for nausea or vomiting. 6 tablet 0   No current facility-administered medications for this visit.      Physical Exam  Blood pressure (!) 137/101, pulse 94, temperature 97.9 F (36.6 C), height 5' 7.5" (1.715 m), weight 204 lb (92.5 kg).  Constitutional: overall normal hygiene, normal nutrition, well developed, normal grooming, normal body habitus. Assistive device:none  Musculoskeletal: gait and station Limp right, muscle tone  and strength are normal, no tremors or atrophy is present.  .  Neurological: coordination overall normal.  Deep tendon reflex/nerve stretch intact.  Sensation normal.  Cranial nerves II-XII intact.   Skin:   Normal overall no scars, lesions, ulcers or rashes. No psoriasis.  Psychiatric: Alert and oriented x 3.  Recent memory intact, remote memory unclear.  Normal mood and affect. Well groomed.  Good eye contact.  Cardiovascular: overall no swelling, no varicosities, no edema bilaterally, normal temperatures of the legs and arms, no clubbing, cyanosis and good capillary refill.  Lymphatic: palpation is normal.  The right lower extremity is examined:  Inspection:  Thigh:  Non-tender and no  defects  Knee has swelling 1+ effusion.                        Joint tenderness is present                        Patient is tender over the medial joint line  Lower Leg:  Has normal appearance and no tenderness or defects  Ankle:  Non-tender and no defects  Foot:  Non-tender and no defects Range of Motion:  Knee:  Range of motion is: 0-110                        Crepitus is  present  Ankle:  Range of motion is normal. Strength and Tone:  The right lower extremity has normal strength and tone. Stability:  Knee:  The knee has positive drawer and Lachman signs as well as medial McMurray sign.  Ankle:  The ankle is stable.    The patient has been educated about the nature of the problem(s) and counseled on treatment options.  The patient appeared to understand what I have discussed and is in agreement with it.  Encounter Diagnoses  Name Primary?  . Rupture of anterior cruciate ligament of right knee, subsequent encounter Yes  . Chronic pain of right knee     PLAN Call if any problems.  Precautions discussed.  Continue current medications.   Return to clinic 3 months   Electronically Signed Darreld McleanWayne Brewer Hitchman, MD 11/7/20179:53 AM

## 2016-09-04 ENCOUNTER — Telehealth: Payer: Self-pay | Admitting: Orthopaedic Surgery

## 2016-09-04 NOTE — Telephone Encounter (Signed)
Patient requests refill:  HYDROcodone-acetaminophen (NORCO/VICODIN) 5-325 MG tablet 35 tablet    - Medicaid

## 2016-09-05 MED ORDER — HYDROCODONE-ACETAMINOPHEN 5-325 MG PO TABS: 1.0000 | ORAL_TABLET | Freq: Four times a day (QID) | ORAL | 0 refills | Status: DC | PRN

## 2016-09-14 IMAGING — DX DG CHEST 2V
2 series · 2 of 2 positions shown · non-contrast
Comparison: 08/18/2015

CLINICAL DATA: Cough.  Chest tightness

EXAM:
CHEST  2 VIEW

[chest pa]
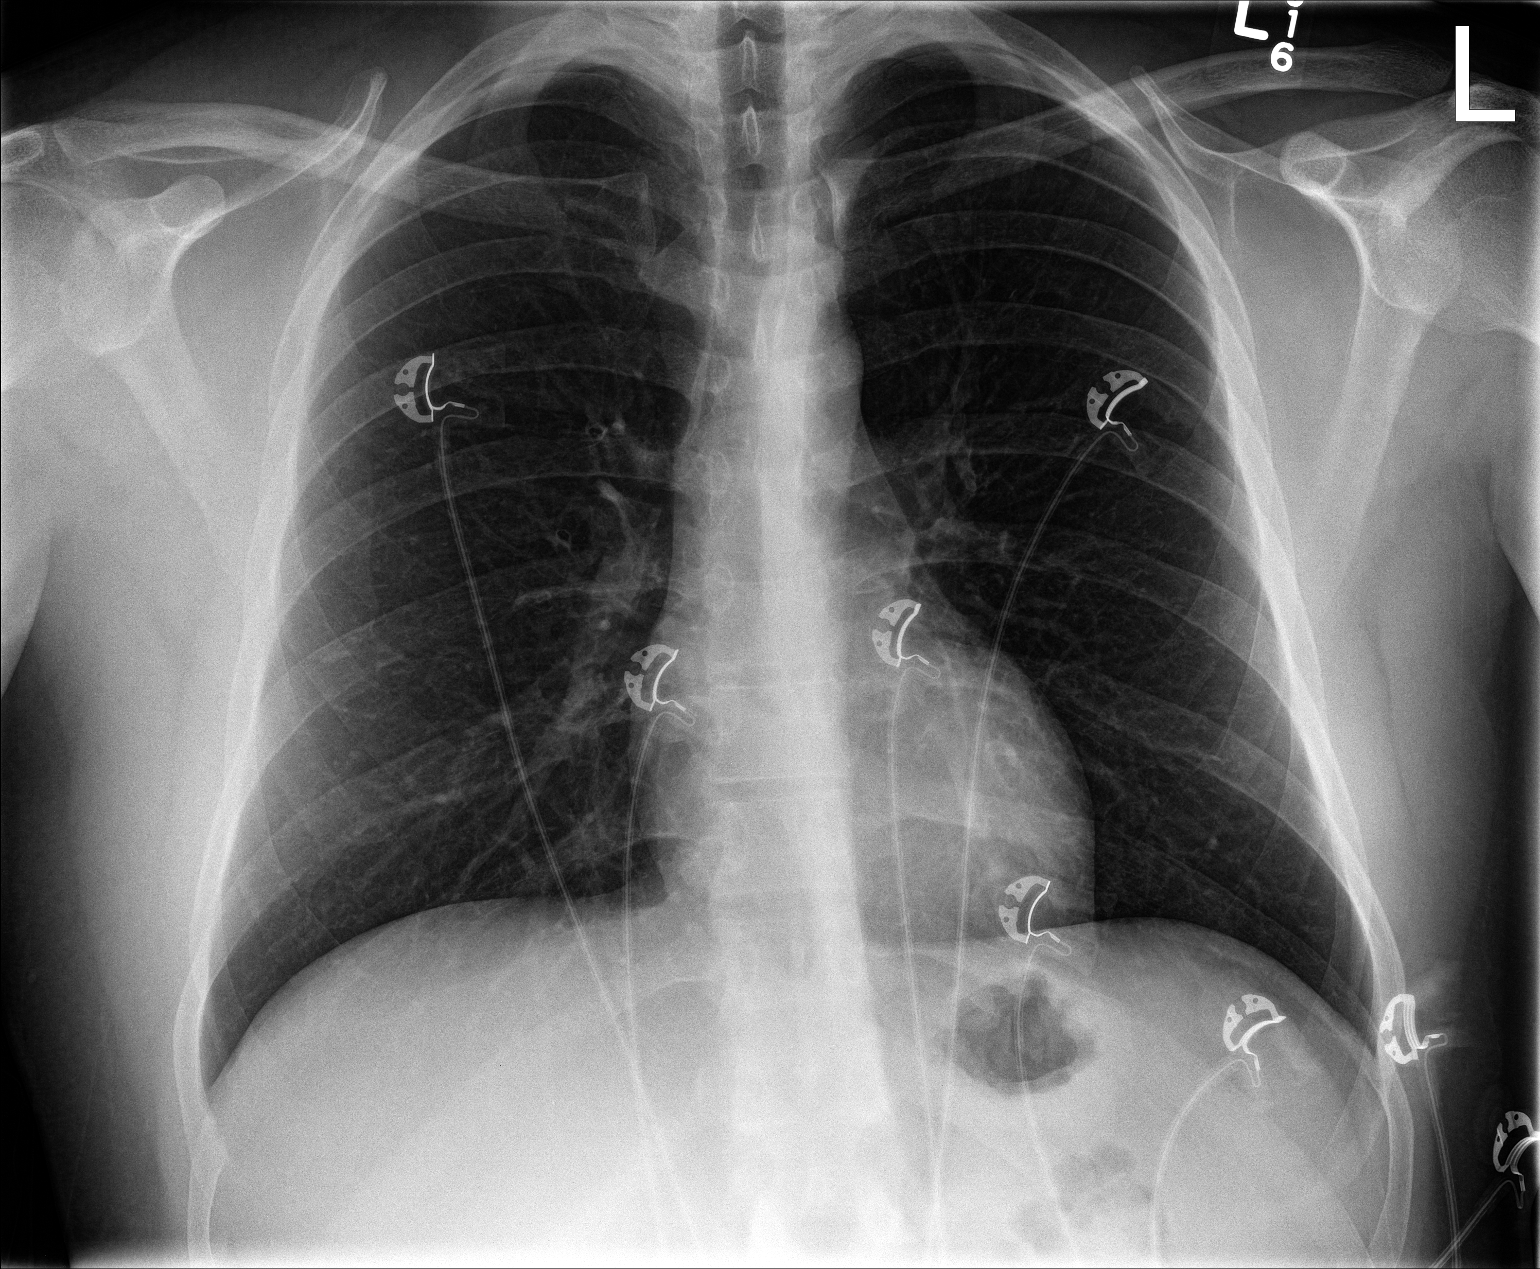

[chest lat]
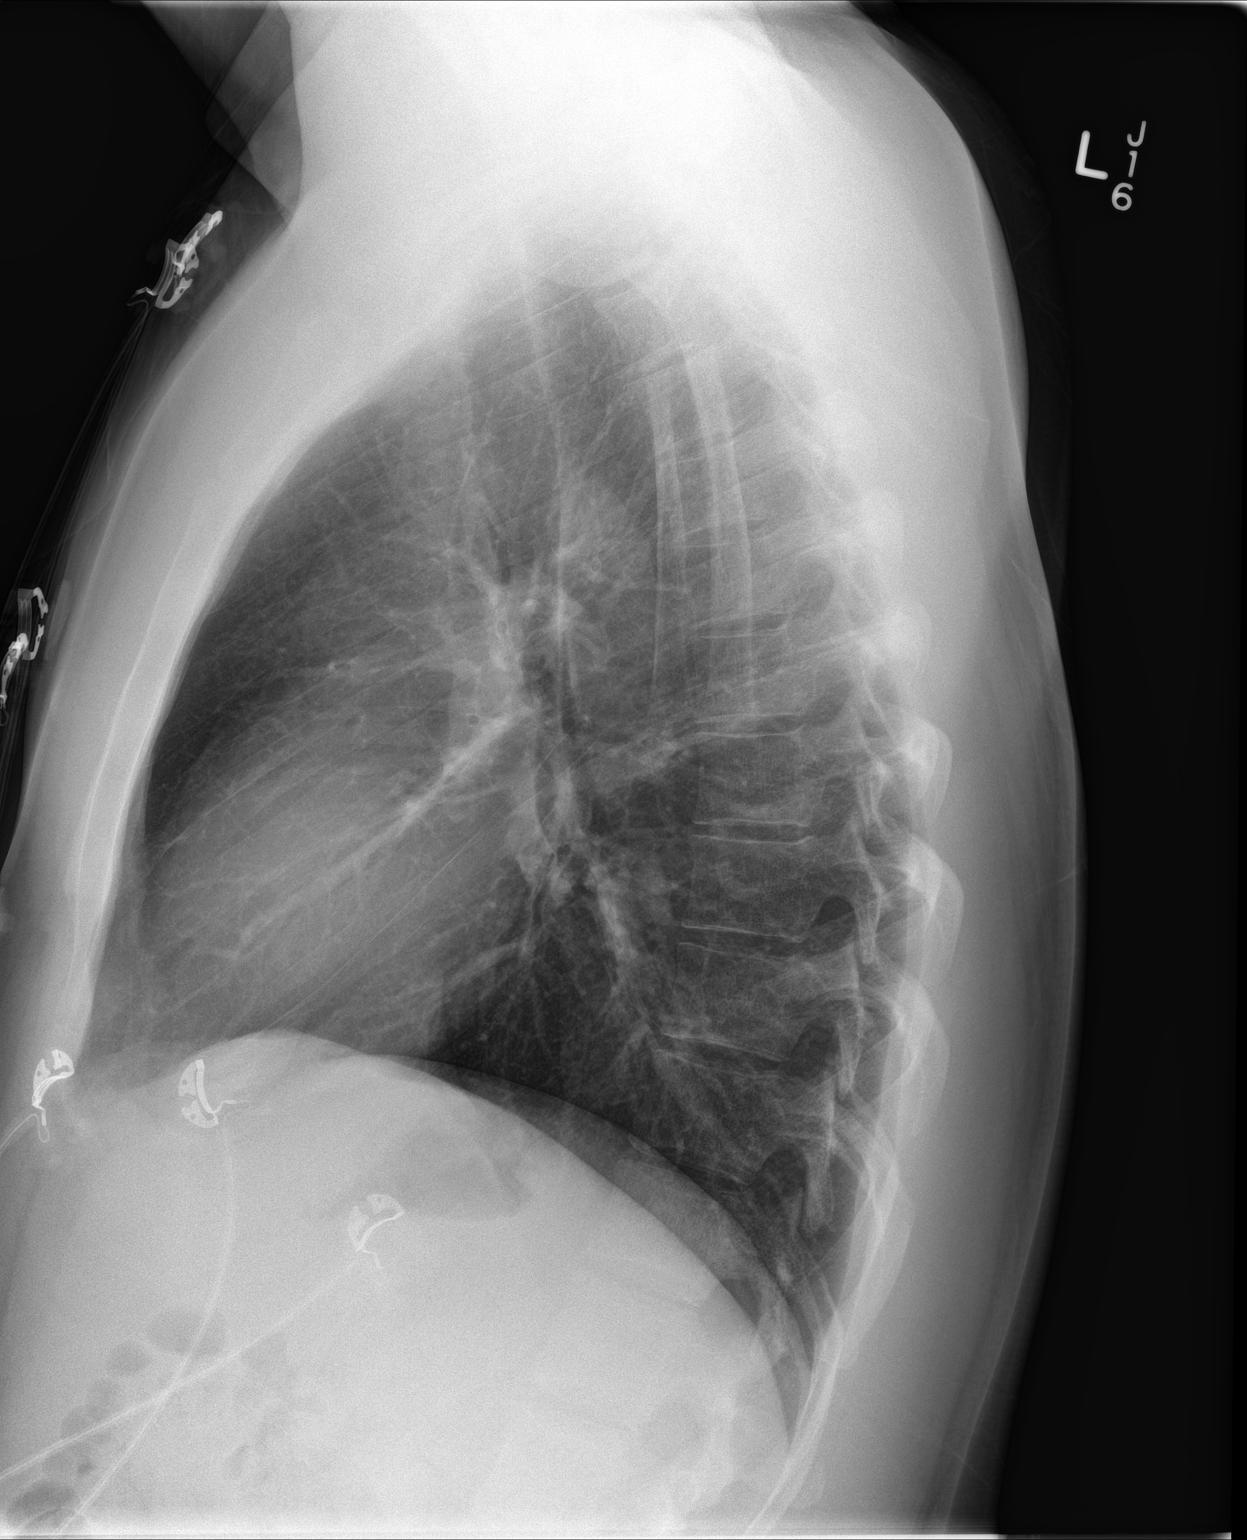

[2 of 2 positions shown; findings below may reference images not displayed]

FINDINGS: Overpenetrated exam.

Lungs are clear without infiltrate or effusion. Heart size and
vascularity normal.
IMPRESSION: No active cardiopulmonary disease.

## 2016-09-18 ENCOUNTER — Ambulatory Visit (INDEPENDENT_AMBULATORY_CARE_PROVIDER_SITE_OTHER): Payer: Medicaid Other | Admitting: Psychiatry

## 2016-09-18 ENCOUNTER — Encounter (HOSPITAL_COMMUNITY): Payer: Self-pay | Admitting: Psychiatry

## 2016-09-18 VITALS — BP 133/92 | HR 73 | Ht 67.5 in | Wt 209.2 lb

## 2016-09-18 DIAGNOSIS — F6381 Intermittent explosive disorder: Secondary | ICD-10-CM | POA: Diagnosis not present

## 2016-09-18 DIAGNOSIS — F902 Attention-deficit hyperactivity disorder, combined type: Secondary | ICD-10-CM | POA: Diagnosis not present

## 2016-09-18 DIAGNOSIS — Z91030 Bee allergy status: Secondary | ICD-10-CM

## 2016-09-18 MED ORDER — AMPHETAMINE-DEXTROAMPHET ER 30 MG PO CP24: 30.0000 mg | ORAL_CAPSULE | ORAL | 0 refills | Status: DC

## 2016-09-18 MED ORDER — AMPHETAMINE-DEXTROAMPHET ER 30 MG PO CP24: 30.0000 mg | ORAL_CAPSULE | Freq: Every day | ORAL | 0 refills | Status: DC

## 2016-09-18 NOTE — Progress Notes (Signed)
Patient ID: Joseph Blair, male   DOB: Jul 19, 1991, 25 y.o.   MRN: [id number] Patient ID: Joseph Blair, male   DOB: Jul 19, 1991, 25 y.o.   MRN: [id number] Patient ID: Joseph Blair, male   DOB: Jul 19, 1991, 25 y.o.   MRN: [id number] Patient ID: Joseph Blair, male   DOB: Jul 19, 1991, 25 y.o.   MRN: [id number] Patient ID: Joseph Blair, male   DOB: Jul 19, 1991, 25 y.o.   MRN: [id number] Patient ID: Joseph Blair, male   DOB: Jul 19, 1991, 25 y.o.   MRN: [id number] Patient ID: Joseph Blair, male   DOB: Jul 19, 1991, 25 y.o.   MRN: [id number] Patient ID: Joseph Blair, male   DOB: Jul 19, 1991, 25 y.o.   MRN: [id number] Patient ID: Joseph Blair, male   DOB: Jul 19, 1991, 25 y.o.   MRN: [id number] Patient ID: Joseph HillockCharlie D Delisle, male   DOB: Jul 19, 1991, 25 y.o.   MRN: [id number] Patient ID: Joseph HillockCharlie D Mathers, male   DOB: Jul 19, 1991, 25 y.o.   MRN: [id number] [facility] [id number] Progress Note Joseph HillockCharlie D Glauber MRN: [id number] DOB: Jul 19, 1991 Age: 25 y.o.  Date: 09/18/2016 Start Time: 10:00 AM End Time: 10:15 AM  Chief Complaint: ADHD   Subjective: I am doing well.  Patient is a 25 year old married white male lives with his wife.and 2 children . He now works at [employer] The patient has a history of ADHD but he can focus well at work with the Adderall XR. He's doing well at his job. He is been out of his medication for a couple of months and is not able to stay focused or to sit still Pt reports that he is compliant with the psychotropic medications with good benefit and no noticeable side effects. He is staying well focused. His temper is under good control. He is sleeping well.  His blood pressure is  a little high today. Of note he was in the hospital over the summer for pericarditis and is still on colchicine.    Allergies: Allergies  Allergen Reactions  . Bee Venom Swelling  Medical History: Past Medical History:  Diagnosis Date  . ADHD  (attention deficit hyperactivity disorder)   . GERD (gastroesophageal reflux disease)   . Hypertension   . Pancreas disorder  two years ago   Hospitalized for Pancreatitis  Surgical History: Past Surgical History:  Procedure Laterality Date  . ANTERIOR CRUCIATE LIGAMENT REPAIR     Left knee Dr. Edger HouseMcGinley  . HERNIA REPAIR    Family History: family history includes ADD / ADHD in his brother and maternal aunt; Alcohol abuse in his maternal uncle; Drug abuse in his maternal uncle; Schizophrenia in his maternal uncle. Reviewed again in full today in the visit. Review of systems is reviewed and is positive for knee pain.  Mental Status Examination  Appearance: Casually dressed Alert: Yes Attention: fair  Cooperative: Yes Eye Contact: Good Speech: Normal in volume, rate, tone, spontaneous  Psychomotor Activity: Normal Memory/Concentration: OK Oriented: person, place and situation Mood: Euthymic Affect: Full Range Thought Processes and Associations: Goal Directed Fund of Knowledge: Fair Thought Content: Suicidal ideation, Homicidal ideation, Auditory hallucinations, Visual hallucinations, Delusions and Paranoia-none reported Insight: Good Judgement: Good  Lab Results:  Recent Results (from the past 8736 hour(s))  CBC with Differential   Collection Time: 04/05/16 12:14 PM  Result Value Ref Range   WBC 6.4 4.0 - 10.5 K/uL   RBC 4.26 4.22 - 5.81 MIL/uL   Hemoglobin 13.1 13.0 - 17.0 g/dL  HCT 38.8 (L) 39.0 - 52.0 %   MCV 91.1 78.0 - 100.0 fL   MCH 30.8 26.0 - 34.0 pg   MCHC 33.8 30.0 - 36.0 g/dL   RDW 40.9 81.1 - 91.4 %   Platelets 219 150 - 400 K/uL   Neutrophils Relative % 47 %   Neutro Abs 3.0 1.7 - 7.7 K/uL   Lymphocytes Relative 32 %   Lymphs Abs 2.1 0.7 - 4.0 K/uL   Monocytes Relative 19 %   Monocytes Absolute 1.2 (H) 0.1 - 1.0 K/uL   Eosinophils Relative 1 %   Eosinophils Absolute 0.1 0.0 - 0.7 K/uL   Basophils Relative 1 %   Basophils Absolute 0.0 0.0 - 0.1 K/uL   Basic metabolic panel   Collection Time: 04/05/16 12:14 PM  Result Value Ref Range   Sodium 136 135 - 145 mmol/L   Potassium 3.8 3.5 - 5.1 mmol/L   Chloride 104 101 - 111 mmol/L   CO2 26 22 - 32 mmol/L   Glucose, Bld 103 (H) 65 - 99 mg/dL   BUN 12 6 - 20 mg/dL   Creatinine, Ser 7.82 0.61 - 1.24 mg/dL   Calcium 9.2 8.9 - 95.6 mg/dL   GFR calc non Af Amer >60 >60 mL/min   GFR calc Af Amer >60 >60 mL/min   Anion gap 6 5 - 15  Troponin I   Collection Time: 04/05/16 12:15 PM  Result Value Ref Range   Troponin I 1.11 (HH) <0.031 ng/mL  I-stat troponin, ED   Collection Time: 04/05/16 12:25 PM  Result Value Ref Range   Troponin i, poc 0.68 (HH) 0.00 - 0.08 ng/mL   Comment 3          Urine rapid drug screen (hosp performed)   Collection Time: 04/05/16  2:54 PM  Result Value Ref Range   Opiates NONE DETECTED NONE DETECTED   Cocaine NONE DETECTED NONE DETECTED   Benzodiazepines NONE DETECTED NONE DETECTED   Amphetamines NONE DETECTED NONE DETECTED   Tetrahydrocannabinol POSITIVE (A) NONE DETECTED   Barbiturates NONE DETECTED NONE DETECTED  Troponin I   Collection Time: 04/05/16  3:47 PM  Result Value Ref Range   Troponin I 1.68 (HH) <0.031 ng/mL  CK   Collection Time: 04/05/16  3:47 PM  Result Value Ref Range   Total CK 358 49 - 397 U/L  Troponin I   Collection Time: 04/05/16 10:46 PM  Result Value Ref Range   Troponin I 1.77 (HH) <0.031 ng/mL  Troponin I   Collection Time: 04/06/16  5:56 AM  Result Value Ref Range   Troponin I 1.68 (HH) <0.031 ng/mL  Echocardiogram   Collection Time: 04/06/16  3:59 PM  Result Value Ref Range   Weight 3,120 oz   Height 69 in   BP 158/95 mmHg   LV PW d 9.31 (A) 0.6 - 1.1 mm   FS 32 28 - 44 %   LA vol 43 mL   LA ID, A-P, ES 41 mm   IVS/LV PW RATIO, ED .89    Stroke v 57 ml   Reg peak vel 212 cm/s   RV sys press 21 mmHg   LV e' LATERAL 14.9 cm/s   LV E/e' medial 7.25    LV E/e'average 7.25    LA diam index 1.96 cm/m2   LA vol A4C  42 ml   E decel time 257 msec   LVOT diameter 19 mm   LVOT area 2.84 cm2  Peak grad 5 mmHg   E/e' ratio 7.25    MV pk E vel 108 m/s   TR max vel 212 cm/s   MV pk A vel 74.2 m/s   LV sys vol 21 21 - 61 mL   LV sys vol index 10 mL/m2   LV dias vol 78 62 - 150 mL   LV dias vol index 37 mL/m2   LA vol index 20.5 mL/m2   MV Dec 257    LA diam end sys 41 mm   Simpson's disk 73    TDI e' medial 10.2    TDI e' lateral 14.9    Lateral S' vel 14.8 cm/sec   TAPSE 20.3 mm  Urinalysis, Routine w reflex microscopic   Collection Time: 06/29/16 10:52 PM  Result Value Ref Range   Color, Urine YELLOW YELLOW   APPearance CLEAR CLEAR   Specific Gravity, Urine 1.020 1.005 - 1.030   pH 6.5 5.0 - 8.0   Glucose, UA NEGATIVE NEGATIVE mg/dL   Hgb urine dipstick TRACE (A) NEGATIVE   Bilirubin Urine NEGATIVE NEGATIVE   Ketones, ur NEGATIVE NEGATIVE mg/dL   Protein, ur NEGATIVE NEGATIVE mg/dL   Nitrite NEGATIVE NEGATIVE   Leukocytes, UA NEGATIVE NEGATIVE  Urine microscopic-add on   Collection Time: 06/29/16 10:52 PM  Result Value Ref Range   Squamous Epithelial / LPF 0-5 (A) NONE SEEN   WBC, UA 0-5 0 - 5 WBC/hpf   RBC / HPF 0-5 0 - 5 RBC/hpf   Bacteria, UA FEW (A) NONE SEEN  Lipase, blood   Collection Time: 06/29/16 11:12 PM  Result Value Ref Range   Lipase 19 11 - 51 U/L  Comprehensive metabolic panel   Collection Time: 06/29/16 11:12 PM  Result Value Ref Range   Sodium 136 135 - 145 mmol/L   Potassium 3.9 3.5 - 5.1 mmol/L   Chloride 102 101 - 111 mmol/L   CO2 25 22 - 32 mmol/L   Glucose, Bld 95 65 - 99 mg/dL   BUN 13 6 - 20 mg/dL   Creatinine, Ser 4.09 0.61 - 1.24 mg/dL   Calcium 9.3 8.9 - 81.1 mg/dL   Total Protein 7.7 6.5 - 8.1 g/dL   Albumin 4.4 3.5 - 5.0 g/dL   AST 36 15 - 41 U/L   ALT 75 (H) 17 - 63 U/L   Alkaline Phosphatase 77 38 - 126 U/L   Total Bilirubin 0.4 0.3 - 1.2 mg/dL   GFR calc non Af Amer >60 >60 mL/min   GFR calc Af Amer >60 >60 mL/min   Anion gap 9 5 - 15   CBC   Collection Time: 06/29/16 11:12 PM  Result Value Ref Range   WBC 8.5 4.0 - 10.5 K/uL   RBC 4.52 4.22 - 5.81 MIL/uL   Hemoglobin 14.2 13.0 - 17.0 g/dL   HCT 91.4 78.2 - 95.6 %   MCV 91.2 78.0 - 100.0 fL   MCH 31.4 26.0 - 34.0 pg   MCHC 34.5 30.0 - 36.0 g/dL   RDW 21.3 08.6 - 57.8 %   Platelets 225 150 - 400 K/uL   PCP orders annual labs with no concerns reported.  Diagnosis: ADHD combined type, moderate severity, intermittent explosive disorder  Plan: I took his vitals.  I reviewed CC, tobacco/med/surg Hx, meds effects/ side effects, problem list, therapies and responses as well as current situation/symptoms discussed options. Continue Adderall XR 30 mg every morning. Continue follow-up with primary care for hypertension See  orders and pt instructions for more details. MEDICATIONS this encounter:AdderallXR 30 mg QAM 3 months given   Medical Decision Making Problem Points:  Established problem, stable/improving (1), Review of last therapy session (1) and Review of psycho-social stressors (1) Data Points:  Review or order clinical lab tests (1) Review of medication regiment & side effects (2)  I certify that outpatient services furnished can reasonably be expected to improve the patient's condition.   Diannia RuderOSS, Lucindia Lemley, MD

## 2016-09-24 ENCOUNTER — Telehealth: Payer: Self-pay | Admitting: Orthopaedic Surgery

## 2016-09-24 MED ORDER — HYDROCODONE-ACETAMINOPHEN 5-325 MG PO TABS: 1.0000 | ORAL_TABLET | Freq: Four times a day (QID) | ORAL | 0 refills | Status: DC | PRN

## 2016-09-24 NOTE — Telephone Encounter (Signed)
Patient requests a refill on Hydrocodone-Acetaminophen 5-325  Mgs.   Qty  30   Sig: Take 1 tablet by mouth every 6 (six) hours as needed for moderate pain (Must last 14 days.Do not take and drive a car or use machinery.).

## 2016-10-02 ENCOUNTER — Telehealth: Payer: Self-pay | Admitting: Orthopaedic Surgery

## 2016-10-02 MED ORDER — HYDROCODONE-ACETAMINOPHEN 5-325 MG PO TABS: 1.0000 | ORAL_TABLET | Freq: Four times a day (QID) | ORAL | 0 refills | Status: DC | PRN

## 2016-10-02 NOTE — Telephone Encounter (Signed)
Hydrocodone-Acetaminophen 5/325 mg   Qty 20 Tablets 

## 2016-11-08 ENCOUNTER — Telehealth: Payer: Self-pay | Admitting: Orthopaedic Surgery

## 2016-11-08 NOTE — Telephone Encounter (Signed)
Patient requests a refill on Hydrocodone/Acetaminophen (Norco)  5-325  Mgs.       Last refill was on 10-02-16 Patient does have Medicaid.

## 2016-11-08 NOTE — Telephone Encounter (Signed)
Denied.  Needs to get medicine from family doctor in future.

## 2016-11-21 ENCOUNTER — Encounter: Payer: Self-pay | Admitting: Orthopaedic Surgery

## 2016-11-21 ENCOUNTER — Ambulatory Visit: Payer: Self-pay | Admitting: Orthopaedic Surgery

## 2016-12-17 ENCOUNTER — Encounter (HOSPITAL_COMMUNITY): Payer: Self-pay | Admitting: Psychiatry

## 2016-12-17 ENCOUNTER — Ambulatory Visit (INDEPENDENT_AMBULATORY_CARE_PROVIDER_SITE_OTHER): Payer: Medicaid Other | Admitting: Psychiatry

## 2016-12-17 VITALS — BP 130/92 | HR 81 | Ht 67.5 in | Wt 201.6 lb

## 2016-12-17 DIAGNOSIS — F902 Attention-deficit hyperactivity disorder, combined type: Secondary | ICD-10-CM

## 2016-12-17 DIAGNOSIS — Z91030 Bee allergy status: Secondary | ICD-10-CM | POA: Diagnosis not present

## 2016-12-17 DIAGNOSIS — F6381 Intermittent explosive disorder: Secondary | ICD-10-CM | POA: Diagnosis not present

## 2016-12-17 MED ORDER — AMPHETAMINE-DEXTROAMPHET ER 30 MG PO CP24: 30.0000 mg | ORAL_CAPSULE | Freq: Every day | ORAL | 0 refills | Status: DC

## 2016-12-17 NOTE — Progress Notes (Signed)
Patient ID: FRANZ SVEC, male   DOB: Jul 07, 1991, 26 y.o.   MRN: [id number] Patient ID: JRUE JARRIEL, male   DOB: [birth year], 26 y.o.   MRN: [id number] Patient ID: DEMOSTHENES VIRNIG, male   DOB: [birth year], 26 y.o.   MRN: [id number] Patient ID: STEVON GOUGH, male   DOB: 07/26/1991, 26 y.o.   MRN: [id number] Patient ID: LEIBISH MCGREGOR, male   DOB: [birth year], 26 y.o.   MRN: [id number] Patient ID: MAGNUM LUNDE, male   DOB: 1991/01/10, 26 y.o.   MRN: [id number] Patient ID: KALANI STHILAIRE, male   DOB: 1990/11/22, 26 y.o.   MRN: [id number] Patient ID: PARRY PO, male   DOB: [birth year], 26 y.o.   MRN: [id number] Patient ID: WYATT THORSTENSON, male   DOB: [birth year], 26 y.o.   MRN: [id number] Patient ID: GILFORD LARDIZABAL, male   DOB: 1991/07/06, 26 y.o.   MRN: [id number] Patient ID: DIMITRY HOLSWORTH, male   DOB: [birth year], 26 y.o.   MRN: [id number] [facility] [id number] Progress Note CHRISTOP HIPPERT MRN: [id number] DOB: September 03, 1991 Age: 26 y.o.  Date: 12/17/2016 Start Time: 10:00 AM End Time: 10:15 AM  Chief Complaint: ADHD   Subjective: I am doing well.  Patient is a 26 year old married white male lives with his wife.and 2 children . He now works at [employer] The patient has a history of ADHD but he can focus well at work with the Adderall XR. He's doing well at his job. Pt reports that he is compliant with the psychotropic medications with good benefit and no noticeable side effects. He is staying well focused. His temper is under good control. He is sleeping well.  His blood pressure is  a little high today. Of note he was in the hospital over the summer for pericarditis and is still on colchicine. 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 Bee Venom Swelling  Medical History: Past  Medical History:  Diagnosis Date  . ADHD (attention deficit hyperactivity disorder)   . GERD (gastroesophageal reflux disease)   . Hypertension   . Pancreas disorder  two years ago   Hospitalized for Pancreatitis  Surgical History: Past Surgical History:  Procedure Laterality Date  . ANTERIOR CRUCIATE LIGAMENT REPAIR     Left knee Dr. Edger House  . HERNIA REPAIR    Family History: family history includes ADD / ADHD in his brother and maternal aunt; Alcohol abuse in his maternal uncle; Drug abuse in his maternal uncle; Schizophrenia in his maternal uncle. Reviewed again in full today in the visit. Review of systems is reviewed and is positive for knee pain.  Mental Status Examination  Appearance: Casually dressed Alert: Yes Attention: fair  Cooperative: Yes Eye Contact: Good Speech: Normal in volume, rate, tone, spontaneous  Psychomotor Activity: Normal Memory/Concentration: OK Oriented: person, place and situation Mood: Euthymic Affect: Full Range Thought Processes and Associations: Goal Directed Fund of Knowledge: Fair Thought Content: Suicidal ideation, Homicidal ideation, Auditory hallucinations, Visual hallucinations, Delusions and Paranoia-none reported Insight: Good Judgement: Good  Lab Results:  Recent Results (from the past 8736 hour(s))  CBC with Differential   Collection Time: 04/05/16 12:14 PM  Result Value Ref Range   WBC 6.4 4.0 - 10.5 K/uL   RBC 4.26 4.22 - 5.81 MIL/uL   Hemoglobin  13.1 13.0 - 17.0 g/dL   HCT 16.1 (L) 09.6 - 04.5 %   MCV 91.1 78.0 - 100.0 fL   MCH 30.8 26.0 - 34.0 pg   MCHC 33.8 30.0 - 36.0 g/dL   RDW 40.9 81.1 - 91.4 %   Platelets 219 150 - 400 K/uL   Neutrophils Relative % 47 %   Neutro Abs 3.0 1.7 - 7.7 K/uL   Lymphocytes Relative 32 %   Lymphs Abs 2.1 0.7 - 4.0 K/uL   Monocytes Relative 19 %   Monocytes Absolute 1.2 (H) 0.1 - 1.0 K/uL   Eosinophils Relative 1 %   Eosinophils Absolute 0.1 0.0 - 0.7 K/uL   Basophils Relative 1 %    Basophils Absolute 0.0 0.0 - 0.1 K/uL  Basic metabolic panel   Collection Time: 04/05/16 12:14 PM  Result Value Ref Range   Sodium 136 135 - 145 mmol/L   Potassium 3.8 3.5 - 5.1 mmol/L   Chloride 104 101 - 111 mmol/L   CO2 26 22 - 32 mmol/L   Glucose, Bld 103 (H) 65 - 99 mg/dL   BUN 12 6 - 20 mg/dL   Creatinine, Ser 7.82 0.61 - 1.24 mg/dL   Calcium 9.2 8.9 - 95.6 mg/dL   GFR calc non Af Amer >60 >60 mL/min   GFR calc Af Amer >60 >60 mL/min   Anion gap 6 5 - 15  Troponin I   Collection Time: 04/05/16 12:15 PM  Result Value Ref Range   Troponin I 1.11 (HH) <0.031 ng/mL  I-stat troponin, ED   Collection Time: 04/05/16 12:25 PM  Result Value Ref Range   Troponin i, poc 0.68 (HH) 0.00 - 0.08 ng/mL   Comment 3          Urine rapid drug screen (hosp performed)   Collection Time: 04/05/16  2:54 PM  Result Value Ref Range   Opiates NONE DETECTED NONE DETECTED   Cocaine NONE DETECTED NONE DETECTED   Benzodiazepines NONE DETECTED NONE DETECTED   Amphetamines NONE DETECTED NONE DETECTED   Tetrahydrocannabinol POSITIVE (A) NONE DETECTED   Barbiturates NONE DETECTED NONE DETECTED  Troponin I   Collection Time: 04/05/16  3:47 PM  Result Value Ref Range   Troponin I 1.68 (HH) <0.031 ng/mL  CK   Collection Time: 04/05/16  3:47 PM  Result Value Ref Range   Total CK 358 49 - 397 U/L  Troponin I   Collection Time: 04/05/16 10:46 PM  Result Value Ref Range   Troponin I 1.77 (HH) <0.031 ng/mL  Troponin I   Collection Time: 04/06/16  5:56 AM  Result Value Ref Range   Troponin I 1.68 (HH) <0.031 ng/mL  Echocardiogram   Collection Time: 04/06/16  3:59 PM  Result Value Ref Range   Weight 3,120 oz   Height 69 in   BP 158/95 mmHg   LV PW d 9.31 (A) 0.6 - 1.1 mm   FS 32 28 - 44 %   LA vol 43 mL   LA ID, A-P, ES 41 mm   IVS/LV PW RATIO, ED .89    Stroke v 57 ml   Reg peak vel 212 cm/s   RV sys press 21 mmHg   LV e' LATERAL 14.9 cm/s   LV E/e' medial 7.25    LV E/e'average 7.25     LA diam index 1.96 cm/m2   LA vol A4C 42 ml   E decel time 257 msec   LVOT diameter 19 mm  LVOT area 2.84 cm2   Peak grad 5 mmHg   E/e' ratio 7.25    MV pk E vel 108 m/s   TR max vel 212 cm/s   MV pk A vel 74.2 m/s   LV sys vol 21 21 - 61 mL   LV sys vol index 10 mL/m2   LV dias vol 78 62 - 150 mL   LV dias vol index 37 mL/m2   LA vol index 20.5 mL/m2   MV Dec 257    LA diam end sys 41 mm   Simpson's disk 73    TDI e' medial 10.2    TDI e' lateral 14.9    Lateral S' vel 14.8 cm/sec   TAPSE 20.3 mm  Urinalysis, Routine w reflex microscopic   Collection Time: 06/29/16 10:52 PM  Result Value Ref Range   Color, Urine YELLOW YELLOW   APPearance CLEAR CLEAR   Specific Gravity, Urine 1.020 1.005 - 1.030   pH 6.5 5.0 - 8.0   Glucose, UA NEGATIVE NEGATIVE mg/dL   Hgb urine dipstick TRACE (A) NEGATIVE   Bilirubin Urine NEGATIVE NEGATIVE   Ketones, ur NEGATIVE NEGATIVE mg/dL   Protein, ur NEGATIVE NEGATIVE mg/dL   Nitrite NEGATIVE NEGATIVE   Leukocytes, UA NEGATIVE NEGATIVE  Urine microscopic-add on   Collection Time: 06/29/16 10:52 PM  Result Value Ref Range   Squamous Epithelial / LPF 0-5 (A) NONE SEEN   WBC, UA 0-5 0 - 5 WBC/hpf   RBC / HPF 0-5 0 - 5 RBC/hpf   Bacteria, UA FEW (A) NONE SEEN  Lipase, blood   Collection Time: 06/29/16 11:12 PM  Result Value Ref Range   Lipase 19 11 - 51 U/L  Comprehensive metabolic panel   Collection Time: 06/29/16 11:12 PM  Result Value Ref Range   Sodium 136 135 - 145 mmol/L   Potassium 3.9 3.5 - 5.1 mmol/L   Chloride 102 101 - 111 mmol/L   CO2 25 22 - 32 mmol/L   Glucose, Bld 95 65 - 99 mg/dL   BUN 13 6 - 20 mg/dL   Creatinine, Ser 0.98 0.61 - 1.24 mg/dL   Calcium 9.3 8.9 - 11.9 mg/dL   Total Protein 7.7 6.5 - 8.1 g/dL   Albumin 4.4 3.5 - 5.0 g/dL   AST 36 15 - 41 U/L   ALT 75 (H) 17 - 63 U/L   Alkaline Phosphatase 77 38 - 126 U/L   Total Bilirubin 0.4 0.3 - 1.2 mg/dL   GFR calc non Af Amer >60 >60 mL/min   GFR calc Af  Amer >60 >60 mL/min   Anion gap 9 5 - 15  CBC   Collection Time: 06/29/16 11:12 PM  Result Value Ref Range   WBC 8.5 4.0 - 10.5 K/uL   RBC 4.52 4.22 - 5.81 MIL/uL   Hemoglobin 14.2 13.0 - 17.0 g/dL   HCT 14.7 82.9 - 56.2 %   MCV 91.2 78.0 - 100.0 fL   MCH 31.4 26.0 - 34.0 pg   MCHC 34.5 30.0 - 36.0 g/dL   RDW 13.0 86.5 - 78.4 %   Platelets 225 150 - 400 K/uL   PCP orders annual labs with no concerns reported.  Diagnosis: ADHD combined type, moderate severity, intermittent explosive disorder  Plan: I took his vitals.  I reviewed CC, tobacco/med/surg Hx, meds effects/ side effects, problem list, therapies and responses as well as current situation/symptoms discussed options. Continue Adderall XR 30 mg every morning. Continue follow-up  with primary care for hypertension See orders and pt instructions for more details. MEDICATIONS this encounter:AdderallXR 30 mg QAM 3 months given   Medical Decision Making Problem Points:  Established problem, stable/improving (1), Review of last therapy session (1) and Review of psycho-social stressors (1) Data Points:  Review or order clinical lab tests (1) Review of medication regiment & side effects (2)  I certify that outpatient services furnished can reasonably be expected to improve the patient's condition.   Diannia RuderOSS, DEBORAH, MD

## 2017-03-19 ENCOUNTER — Encounter (HOSPITAL_COMMUNITY): Payer: Self-pay | Admitting: Psychiatry

## 2017-03-19 ENCOUNTER — Ambulatory Visit (INDEPENDENT_AMBULATORY_CARE_PROVIDER_SITE_OTHER): Payer: Medicaid Other | Admitting: Psychiatry

## 2017-03-19 VITALS — BP 138/91 | HR 72 | Ht 67.5 in | Wt 201.6 lb

## 2017-03-19 DIAGNOSIS — Z814 Family history of other substance abuse and dependence: Secondary | ICD-10-CM

## 2017-03-19 DIAGNOSIS — Z79899 Other long term (current) drug therapy: Secondary | ICD-10-CM

## 2017-03-19 DIAGNOSIS — F902 Attention-deficit hyperactivity disorder, combined type: Secondary | ICD-10-CM

## 2017-03-19 DIAGNOSIS — Z818 Family history of other mental and behavioral disorders: Secondary | ICD-10-CM | POA: Diagnosis not present

## 2017-03-19 DIAGNOSIS — Z91030 Bee allergy status: Secondary | ICD-10-CM

## 2017-03-19 DIAGNOSIS — F6381 Intermittent explosive disorder: Secondary | ICD-10-CM | POA: Diagnosis not present

## 2017-03-19 DIAGNOSIS — Z811 Family history of alcohol abuse and dependence: Secondary | ICD-10-CM

## 2017-03-19 MED ORDER — AMPHETAMINE-DEXTROAMPHET ER 30 MG PO CP24: 30.0000 mg | ORAL_CAPSULE | Freq: Every day | ORAL | 0 refills | Status: DC

## 2017-03-19 NOTE — Progress Notes (Signed)
Patient ID: Darrelyn HillockCharlie D Packard, male   DOB: 1990-10-29, 26 y.o.   MRN: [id number] Patient ID: Darrelyn HillockCharlie D Fagerstrom, male   DOB: 1990-10-29, 26 y.o.   MRN: [id number] Patient ID: Darrelyn HillockCharlie D Maslowski, male   DOB: 1990-10-29, 26 y.o.   MRN: [id number] Patient ID: Darrelyn HillockCharlie D Amadon, male   DOB: 1990-10-29, 26 y.o.   MRN: [id number] Patient ID: Darrelyn HillockCharlie D Brazil, male   DOB: 1990-10-29, 26 y.o.   MRN: [id number] Patient ID: Darrelyn HillockCharlie D Ringstad, male   DOB: 1990-10-29, 26 y.o.   MRN: [id number] Patient ID: Darrelyn HillockCharlie D Mudgett, male   DOB: 1990-10-29, 26 y.o.   MRN: [id number] Patient ID: Darrelyn HillockCharlie D Lovern, male   DOB: 1990-10-29, 26 y.o.   MRN: [id number] Patient ID: Darrelyn HillockCharlie D Gilcrest, male   DOB: 1990-10-29, 26 y.o.   MRN: [id number] Patient ID: Darrelyn HillockCharlie D Markus, male   DOB: 1990-10-29, 26 y.o.   MRN: [id number] Patient ID: Darrelyn HillockCharlie D Voth, male   DOB: 1990-10-29, 26 y.o.   MRN: [id number] [id number] Progress Note Darrelyn HillockCharlie D Piscitelli MRN: [id number] DOB: 1990-10-29 Age: 26 y.o.  Date: 03/19/2017 Start Time: 10:00 AM End Time: 10:15 AM  Chief Complaint: ADHD   Subjective: I am doing well.  Patient is a 26 year old married white male lives with his wife.and 2 children . He now works at [employer] The patient has a history of ADHD but he can focus well at work with the Adderall XR. He's doing well at his job. Pt reports that he is compliant with the psychotropic medications with good benefit and no noticeable side effects. He is staying well focused. His temper is under good control. He is sleeping well.  His blood pressure is  a little high today. His primary physician this week and I strongly suggested he let him know that he's had several high blood pressure readings here. He is on lisinopril 10 mg daily    Allergies: Allergies  Allergen Reactions  . Bee Venom Swelling  Medical History: Past Medical History:  Diagnosis Date  . ADHD (attention deficit hyperactivity  disorder)   . GERD (gastroesophageal reflux disease)   . Hypertension   . Pancreas disorder  two years ago   Hospitalized for Pancreatitis  Surgical History: Past Surgical History:  Procedure Laterality Date  . ANTERIOR CRUCIATE LIGAMENT REPAIR     Left knee Dr. Edger HouseMcGinley  . HERNIA REPAIR    Family History: family history includes ADD / ADHD in his brother and maternal aunt; Alcohol abuse in his maternal uncle; Drug abuse in his maternal uncle; Schizophrenia in his maternal uncle. Reviewed again in full today in the visit. Review of systems is reviewed and is positive for knee pain.  Mental Status Examination  Appearance: Casually dressed Alert: Yes Attention: fair  Cooperative: Yes Eye Contact: Good Speech: Normal in volume, rate, tone, spontaneous  Psychomotor Activity: Normal Memory/Concentration: OK Oriented: person, place and situation Mood: Euthymic Affect: Full Range Thought Processes and Associations: Goal Directed Fund of Knowledge: Fair Thought Content: Suicidal ideation, Homicidal ideation, Auditory hallucinations, Visual hallucinations, Delusions and Paranoia-none reported Insight: Good Judgement: Good  Lab Results:  Results for orders placed or performed during the hospital encounter of 06/29/16 (from the past 8736 hour(s))  Urinalysis, Routine w reflex microscopic   Collection Time: 06/29/16 10:52 PM  Result Value Ref Range   Color, Urine YELLOW YELLOW   APPearance CLEAR CLEAR   Specific Gravity, Urine 1.020 1.005 - 1.030   pH 6.5  5.0 - 8.0   Glucose, UA NEGATIVE NEGATIVE mg/dL   Hgb urine dipstick TRACE (A) NEGATIVE   Bilirubin Urine NEGATIVE NEGATIVE   Ketones, ur NEGATIVE NEGATIVE mg/dL   Protein, ur NEGATIVE NEGATIVE mg/dL   Nitrite NEGATIVE NEGATIVE   Leukocytes, UA NEGATIVE NEGATIVE  Urine microscopic-add on   Collection Time: 06/29/16 10:52 PM  Result Value Ref Range   Squamous Epithelial / LPF 0-5 (A) NONE SEEN   WBC, UA 0-5 0 - 5 WBC/hpf    RBC / HPF 0-5 0 - 5 RBC/hpf   Bacteria, UA FEW (A) NONE SEEN  Lipase, blood   Collection Time: 06/29/16 11:12 PM  Result Value Ref Range   Lipase 19 11 - 51 U/L  Comprehensive metabolic panel   Collection Time: 06/29/16 11:12 PM  Result Value Ref Range   Sodium 136 135 - 145 mmol/L   Potassium 3.9 3.5 - 5.1 mmol/L   Chloride 102 101 - 111 mmol/L   CO2 25 22 - 32 mmol/L   Glucose, Bld 95 65 - 99 mg/dL   BUN 13 6 - 20 mg/dL   Creatinine, Ser 4.09 0.61 - 1.24 mg/dL   Calcium 9.3 8.9 - 81.1 mg/dL   Total Protein 7.7 6.5 - 8.1 g/dL   Albumin 4.4 3.5 - 5.0 g/dL   AST 36 15 - 41 U/L   ALT 75 (H) 17 - 63 U/L   Alkaline Phosphatase 77 38 - 126 U/L   Total Bilirubin 0.4 0.3 - 1.2 mg/dL   GFR calc non Af Amer >60 >60 mL/min   GFR calc Af Amer >60 >60 mL/min   Anion gap 9 5 - 15  CBC   Collection Time: 06/29/16 11:12 PM  Result Value Ref Range   WBC 8.5 4.0 - 10.5 K/uL   RBC 4.52 4.22 - 5.81 MIL/uL   Hemoglobin 14.2 13.0 - 17.0 g/dL   HCT 91.4 78.2 - 95.6 %   MCV 91.2 78.0 - 100.0 fL   MCH 31.4 26.0 - 34.0 pg   MCHC 34.5 30.0 - 36.0 g/dL   RDW 21.3 08.6 - 57.8 %   Platelets 225 150 - 400 K/uL  Results for orders placed or performed during the hospital encounter of 04/05/16 (from the past 8736 hour(s))  CBC with Differential   Collection Time: 04/05/16 12:14 PM  Result Value Ref Range   WBC 6.4 4.0 - 10.5 K/uL   RBC 4.26 4.22 - 5.81 MIL/uL   Hemoglobin 13.1 13.0 - 17.0 g/dL   HCT 46.9 (L) 62.9 - 52.8 %   MCV 91.1 78.0 - 100.0 fL   MCH 30.8 26.0 - 34.0 pg   MCHC 33.8 30.0 - 36.0 g/dL   RDW 41.3 24.4 - 01.0 %   Platelets 219 150 - 400 K/uL   Neutrophils Relative % 47 %   Neutro Abs 3.0 1.7 - 7.7 K/uL   Lymphocytes Relative 32 %   Lymphs Abs 2.1 0.7 - 4.0 K/uL   Monocytes Relative 19 %   Monocytes Absolute 1.2 (H) 0.1 - 1.0 K/uL   Eosinophils Relative 1 %   Eosinophils Absolute 0.1 0.0 - 0.7 K/uL   Basophils Relative 1 %   Basophils Absolute 0.0 0.0 - 0.1 K/uL  Basic  metabolic panel   Collection Time: 04/05/16 12:14 PM  Result Value Ref Range   Sodium 136 135 - 145 mmol/L   Potassium 3.8 3.5 - 5.1 mmol/L   Chloride 104 101 - 111 mmol/L  CO2 26 22 - 32 mmol/L   Glucose, Bld 103 (H) 65 - 99 mg/dL   BUN 12 6 - 20 mg/dL   Creatinine, Ser 1.61 0.61 - 1.24 mg/dL   Calcium 9.2 8.9 - 09.6 mg/dL   GFR calc non Af Amer >60 >60 mL/min   GFR calc Af Amer >60 >60 mL/min   Anion gap 6 5 - 15  Troponin I   Collection Time: 04/05/16 12:15 PM  Result Value Ref Range   Troponin I 1.11 (HH) <0.031 ng/mL  I-stat troponin, ED   Collection Time: 04/05/16 12:25 PM  Result Value Ref Range   Troponin i, poc 0.68 (HH) 0.00 - 0.08 ng/mL   Comment 3          Urine rapid drug screen (hosp performed)   Collection Time: 04/05/16  2:54 PM  Result Value Ref Range   Opiates NONE DETECTED NONE DETECTED   Cocaine NONE DETECTED NONE DETECTED   Benzodiazepines NONE DETECTED NONE DETECTED   Amphetamines NONE DETECTED NONE DETECTED   Tetrahydrocannabinol POSITIVE (A) NONE DETECTED   Barbiturates NONE DETECTED NONE DETECTED  Troponin I   Collection Time: 04/05/16  3:47 PM  Result Value Ref Range   Troponin I 1.68 (HH) <0.031 ng/mL  CK   Collection Time: 04/05/16  3:47 PM  Result Value Ref Range   Total CK 358 49 - 397 U/L  Troponin I   Collection Time: 04/05/16 10:46 PM  Result Value Ref Range   Troponin I 1.77 (HH) <0.031 ng/mL  Troponin I   Collection Time: 04/06/16  5:56 AM  Result Value Ref Range   Troponin I 1.68 (HH) <0.031 ng/mL  Echocardiogram   Collection Time: 04/06/16  3:59 PM  Result Value Ref Range   Weight 3,120 oz   Height 69 in   BP 158/95 mmHg   LV PW d 9.31 (A) 0.6 - 1.1 mm   FS 32 28 - 44 %   LA vol 43 mL   LA ID, A-P, ES 41 mm   IVS/LV PW RATIO, ED .89    Stroke v 57 ml   Reg peak vel 212 cm/s   RV sys press 21 mmHg   LV e' LATERAL 14.9 cm/s   LV E/e' medial 7.25    LV E/e'average 7.25    LA diam index 1.96 cm/m2   LA vol A4C 42 ml    E decel time 257 msec   LVOT diameter 19 mm   LVOT area 2.84 cm2   Peak grad 5 mmHg   E/e' ratio 7.25    MV pk E vel 108 m/s   TR max vel 212 cm/s   MV pk A vel 74.2 m/s   LV sys vol 21 21 - 61 mL   LV sys vol index 10 mL/m2   LV dias vol 78 62 - 150 mL   LV dias vol index 37 mL/m2   LA vol index 20.5 mL/m2   MV Dec 257    LA diam end sys 41 mm   Simpson's disk 73    TDI e' medial 10.2    TDI e' lateral 14.9    Lateral S' vel 14.8 cm/sec   TAPSE 20.3 mm   PCP orders annual labs with no concerns reported.  Diagnosis: ADHD combined type, moderate severity, intermittent explosive disorder  Plan: I took his vitals.  I reviewed CC, tobacco/med/surg Hx, meds effects/ side effects, problem list, therapies and responses as well as  current situation/symptoms discussed options. Continue Adderall XR 30 mg every morning. Continue follow-up with primary care for hypertension See orders and pt instructions for more details. MEDICATIONS this encounter:AdderallXR 30 mg QAM 3 months given   Medical Decision Making Problem Points:  Established problem, stable/improving (1), Review of last therapy session (1) and Review of psycho-social stressors (1) Data Points:  Review or order clinical lab tests (1) Review of medication regiment & side effects (2)  I certify that outpatient services furnished can reasonably be expected to improve the patient's condition.   Diannia Ruder, MD

## 2017-04-19 ENCOUNTER — Other Ambulatory Visit (HOSPITAL_COMMUNITY): Payer: Self-pay | Admitting: *Deleted

## 2017-04-19 ENCOUNTER — Encounter (HOSPITAL_COMMUNITY): Payer: Self-pay | Admitting: Psychiatry

## 2017-04-19 ENCOUNTER — Telehealth (HOSPITAL_COMMUNITY): Payer: Self-pay | Admitting: *Deleted

## 2017-04-19 MED ORDER — AMPHETAMINE-DEXTROAMPHET ER 30 MG PO CP24: 30.0000 mg | ORAL_CAPSULE | Freq: Every day | ORAL | 0 refills | Status: DC

## 2017-04-19 NOTE — Telephone Encounter (Signed)
Called & notified Rx are available for pick-up & that office closes @ 12:00. Patient called in earlier asking for replacement Rx after losing scripts for July & Aug while moving.

## 2017-04-19 NOTE — Telephone Encounter (Signed)
Pt came into office to pick up printed script per previous message. Pt did not have his D./L card but verbalized the number to staff. D/L number is [phone]. Staff informed pt that he have to bring his D/L in whenever he comes in for another pick up script. Pt verbalized understanding and looked at script to verify he got the correct script.

## 2017-06-18 ENCOUNTER — Ambulatory Visit (HOSPITAL_COMMUNITY): Payer: Self-pay | Admitting: Psychiatry

## 2017-06-20 ENCOUNTER — Ambulatory Visit (INDEPENDENT_AMBULATORY_CARE_PROVIDER_SITE_OTHER): Payer: Medicaid Other | Admitting: Psychiatry

## 2017-06-20 ENCOUNTER — Encounter (HOSPITAL_COMMUNITY): Payer: Self-pay | Admitting: Psychiatry

## 2017-06-20 VITALS — BP 144/98 | HR 77 | Ht 67.5 in | Wt 207.0 lb

## 2017-06-20 DIAGNOSIS — F902 Attention-deficit hyperactivity disorder, combined type: Secondary | ICD-10-CM

## 2017-06-20 DIAGNOSIS — F6381 Intermittent explosive disorder: Secondary | ICD-10-CM

## 2017-06-20 MED ORDER — AMPHETAMINE-DEXTROAMPHET ER 30 MG PO CP24: 30.0000 mg | ORAL_CAPSULE | Freq: Every day | ORAL | 0 refills | Status: DC

## 2017-06-20 NOTE — Progress Notes (Signed)
Patient ID: Joseph Blair, male   DOB: 1990-11-10, 26 y.o.   MRN: [id number] Patient ID: Joseph Blair, male   DOB: 1990-11-10, 26 y.o.   MRN: [id number] Patient ID: Joseph Blair, male   DOB: 1990-11-10, 26 y.o.   MRN: [id number] Patient ID: Joseph Blair, male   DOB: 1990-11-10, 26 y.o.   MRN: [id number] Patient ID: Joseph Blair, male   DOB: 1990-11-10, 26 y.o.   MRN: [id number] Patient ID: Joseph Blair, male   DOB: 1990-11-10, 26 y.o.   MRN: [id number] Patient ID: Joseph Blair, male   DOB: 1990-11-10, 26 y.o.   MRN: [id number] Patient ID: Joseph Blair, male   DOB: 1990-11-10, 26 y.o.   MRN: [id number] Patient ID: Joseph Blair, male   DOB: 1990-11-10, 26 y.o.   MRN: [id number] Patient ID: Joseph HillockCharlie D Longmire, male   DOB: 1990-11-10, 26 y.o.   MRN: [id number] Patient ID: Joseph HillockCharlie D Cohoon, male   DOB: 1990-11-10, 26 y.o.   MRN: [id number] [facility] [id number] Progress Note Joseph HillockCharlie D Hally MRN: [id number] DOB: 1990-11-10 Age: 26 y.o.  Date: 06/20/2017 Start Time: 10:00 AM End Time: 10:15 AM  Chief Complaint: ADHD   Subjective: I am doing well.  Patient is a 26 year old married white male lives with his wife.and 2 children . He now works at [employer] The patient has a history of ADHD but he can focus well at work with the Adderall XR. He's doing well at his job. Pt reports that he is compliant with the psychotropic medications with good benefit and no noticeable side effects. He is staying well focused. His temper is under good control. He is sleeping well.  His blood pressure is  a little high today but he claims that he hasn't taken his lisinopril yet. He had run out of it recently and is going to his primary doctor today.    Allergies: Allergies  Allergen Reactions  . Bee Venom Swelling  Medical History: Past Medical History:  Diagnosis Date  . ADHD (attention deficit hyperactivity disorder)   . GERD (gastroesophageal  reflux disease)   . Hypertension   . Pancreas disorder  two years ago   Hospitalized for Pancreatitis  Surgical History: Past Surgical History:  Procedure Laterality Date  . ANTERIOR CRUCIATE LIGAMENT REPAIR     Left knee Dr. Edger HouseMcGinley  . HERNIA REPAIR    Family History: family history includes ADD / ADHD in his brother and maternal aunt; Alcohol abuse in his maternal uncle; Drug abuse in his maternal uncle; Schizophrenia in his maternal uncle. Reviewed again in full today in the visit. Review of systems is reviewed and is positive for knee pain.  Mental Status Examination  Appearance: Casually dressed Alert: Yes Attention: fair  Cooperative: Yes Eye Contact: Good Speech: Normal in volume, rate, tone, spontaneous  Psychomotor Activity: Normal Memory/Concentration: OK Oriented: person, place and situation Mood: Euthymic Affect: Full Range Thought Processes and Associations: Goal Directed Fund of Knowledge: Fair Thought Content: Suicidal ideation, Homicidal ideation, Auditory hallucinations, Visual hallucinations, Delusions and Paranoia-none reported Insight: Good Judgement: Good  Lab Results:  Results for orders placed or performed during the hospital encounter of 06/29/16 (from the past 8736 hour(s))  Urinalysis, Routine w reflex microscopic   Collection Time: 06/29/16 10:52 PM  Result Value Ref Range   Color, Urine YELLOW YELLOW   APPearance CLEAR CLEAR   Specific Gravity, Urine 1.020 1.005 - 1.030   pH 6.5 5.0 - 8.0  Glucose, UA NEGATIVE NEGATIVE mg/dL   Hgb urine dipstick TRACE (A) NEGATIVE   Bilirubin Urine NEGATIVE NEGATIVE   Ketones, ur NEGATIVE NEGATIVE mg/dL   Protein, ur NEGATIVE NEGATIVE mg/dL   Nitrite NEGATIVE NEGATIVE   Leukocytes, UA NEGATIVE NEGATIVE  Urine microscopic-add on   Collection Time: 06/29/16 10:52 PM  Result Value Ref Range   Squamous Epithelial / LPF 0-5 (A) NONE SEEN   WBC, UA 0-5 0 - 5 WBC/hpf   RBC / HPF 0-5 0 - 5 RBC/hpf    Bacteria, UA FEW (A) NONE SEEN  Lipase, blood   Collection Time: 06/29/16 11:12 PM  Result Value Ref Range   Lipase 19 11 - 51 U/L  Comprehensive metabolic panel   Collection Time: 06/29/16 11:12 PM  Result Value Ref Range   Sodium 136 135 - 145 mmol/L   Potassium 3.9 3.5 - 5.1 mmol/L   Chloride 102 101 - 111 mmol/L   CO2 25 22 - 32 mmol/L   Glucose, Bld 95 65 - 99 mg/dL   BUN 13 6 - 20 mg/dL   Creatinine, Ser 4.54 0.61 - 1.24 mg/dL   Calcium 9.3 8.9 - 09.8 mg/dL   Total Protein 7.7 6.5 - 8.1 g/dL   Albumin 4.4 3.5 - 5.0 g/dL   AST 36 15 - 41 U/L   ALT 75 (H) 17 - 63 U/L   Alkaline Phosphatase 77 38 - 126 U/L   Total Bilirubin 0.4 0.3 - 1.2 mg/dL   GFR calc non Af Amer >60 >60 mL/min   GFR calc Af Amer >60 >60 mL/min   Anion gap 9 5 - 15  CBC   Collection Time: 06/29/16 11:12 PM  Result Value Ref Range   WBC 8.5 4.0 - 10.5 K/uL   RBC 4.52 4.22 - 5.81 MIL/uL   Hemoglobin 14.2 13.0 - 17.0 g/dL   HCT 11.9 14.7 - 82.9 %   MCV 91.2 78.0 - 100.0 fL   MCH 31.4 26.0 - 34.0 pg   MCHC 34.5 30.0 - 36.0 g/dL   RDW 56.2 13.0 - 86.5 %   Platelets 225 150 - 400 K/uL   PCP orders annual labs with no concerns reported.  Diagnosis: ADHD combined type, moderate severity, intermittent explosive disorder  Plan: I took his vitals.  I reviewed CC, tobacco/med/surg Hx, meds effects/ side effects, problem list, therapies and responses as well as current situation/symptoms discussed options. Continue Adderall XR 30 mg every morning. Continue follow-up with primary care for hypertension See orders and pt instructions for more details. MEDICATIONS this encounter:AdderallXR 30 mg QAM 3 months given   Medical Decision Making Problem Points:  Established problem, stable/improving (1), Review of last therapy session (1) and Review of psycho-social stressors (1) Data Points:  Review or order clinical lab tests (1) Review of medication regiment & side effects (2)  I certify that outpatient  services furnished can reasonably be expected to improve the patient's condition.   Diannia Ruder, MD

## 2017-09-19 ENCOUNTER — Ambulatory Visit (HOSPITAL_COMMUNITY): Payer: Self-pay | Admitting: Psychiatry

## 2018-07-18 ENCOUNTER — Encounter (HOSPITAL_COMMUNITY): Payer: Self-pay | Admitting: Emergency Medicine

## 2018-07-18 ENCOUNTER — Emergency Department (HOSPITAL_COMMUNITY): Payer: Medicaid Other

## 2018-07-18 ENCOUNTER — Emergency Department (HOSPITAL_COMMUNITY)
Admission: EM | Admit: 2018-07-18 | Discharge: 2018-07-18 | Disposition: A | Discharge status: Home / Self Care | Payer: Medicaid Other | Attending: Emergency Medicine | Admitting: Emergency Medicine

## 2018-07-18 DIAGNOSIS — I10 Essential (primary) hypertension: Secondary | ICD-10-CM | POA: Diagnosis not present

## 2018-07-18 DIAGNOSIS — R0789 Other chest pain: Secondary | ICD-10-CM | POA: Diagnosis not present

## 2018-07-18 DIAGNOSIS — F909 Attention-deficit hyperactivity disorder, unspecified type: Secondary | ICD-10-CM | POA: Insufficient documentation

## 2018-07-18 DIAGNOSIS — Z79899 Other long term (current) drug therapy: Secondary | ICD-10-CM | POA: Diagnosis not present

## 2018-07-18 DIAGNOSIS — F17210 Nicotine dependence, cigarettes, uncomplicated: Secondary | ICD-10-CM | POA: Diagnosis not present

## 2018-07-18 DIAGNOSIS — R079 Chest pain, unspecified: Secondary | ICD-10-CM | POA: Diagnosis present

## 2018-07-18 LAB — BASIC METABOLIC PANEL
Anion gap: 9 (ref 5–15)
BUN: 13 mg/dL (ref 6–20)
CO2: 23 mmol/L (ref 22–32)
Calcium: 9.3 mg/dL (ref 8.9–10.3)
Chloride: 106 mmol/L (ref 98–111)
Creatinine, Ser: 0.81 mg/dL (ref 0.61–1.24)
GFR calc Af Amer: >60 mL/min (ref >60)
GFR calc non Af Amer: >60 mL/min (ref >60)
Glucose, Bld: 87 mg/dL (ref 70–99)
Potassium: 4.3 mmol/L (ref 3.5–5.1)
SODIUM: 138 mmol/L (ref 135–145)

## 2018-07-18 LAB — CBC
HEMATOCRIT: 41.2 % (ref 39.0–52.0)
HEMOGLOBIN: 14.1 g/dL (ref 13.0–17.0)
MCH: 33.7 pg (ref 26.0–34.0)
MCHC: 34.2 g/dL (ref 30.0–36.0)
MCV: 98.6 fL (ref 78.0–100.0)
Platelets: 233 10*3/uL (ref 150–400)
RBC: 4.18 MIL/uL — ABNORMAL LOW (ref 4.22–5.81)
RDW: 12.7 % (ref 11.5–15.5)
WBC: 8 10*3/uL (ref 4.0–10.5)

## 2018-07-18 LAB — TROPONIN I: Troponin I: <0.03 ng/mL (ref <0.03)

## 2018-07-18 LAB — SEDIMENTATION RATE: SED RATE: 9 mm/h (ref 0–16)

## 2018-07-18 MED ORDER — IBUPROFEN 400 MG PO TABS: 400.0000 mg | ORAL_TABLET | Freq: Three times a day (TID) | ORAL | 0 refills | Status: AC

## 2018-07-18 MED ORDER — KETOROLAC TROMETHAMINE 30 MG/ML IJ SOLN
15.0000 mg | Freq: Once | INTRAMUSCULAR | Status: AC
  Administered 2018-07-18: 15 mg via INTRAVENOUS
  Filled 2018-07-18: qty 1

## 2018-07-18 NOTE — ED Notes (Signed)
Reports pain relief.

## 2018-07-18 NOTE — ED Notes (Signed)
Dr L at bedside 

## 2018-07-18 NOTE — ED Notes (Signed)
To rad 

## 2018-07-18 NOTE — ED Triage Notes (Signed)
Pt c/o of left sided chest pain radiating to left flank area and neck.

## 2018-07-18 NOTE — Discharge Instructions (Signed)
As discussed, your evaluation today has been largely reassuring.  But, it is important that you monitor your condition carefully, and do not hesitate to return to the ED if you develop new, or concerning changes in your condition. ? ?Otherwise, please follow-up with your physician for appropriate ongoing care. ? ?

## 2018-07-18 NOTE — ED Provider Notes (Signed)
[facility] EMERGENCY DEPARTMENT Provider Note   CSN: [id number] Arrival date & time: 07/18/18  1823     History   Chief Complaint Chief Complaint  Patient presents with  . Chest Pain    HPI Joseph Blair is a 27 y.o. male.  HPI  Presents with concern of chest pain. This episode began about 4 hours ago, as the patient was driving. Since onset the pain has been waxing, waning in severity, is focally in the left upper chest and left shoulder. Some associated dyspnea, though inconsistently. No fever, no chills, no cough. No medication taken for pain relief. He is here with his wife who assists with the HPI. Patient has a notable history of prior chest pain admission, which she is unclear of the diagnosis, but per chart review patient may have had pericarditis.   Past Medical History:  Diagnosis Date  . ADHD (attention deficit hyperactivity disorder)   . GERD (gastroesophageal reflux disease)   . Hypertension   . Pancreas disorder  two years ago   Hospitalized for Pancreatitis    Patient Active Problem List   Diagnosis Date Noted  . Acute myopericarditis 04/05/2016  . Chest pain 04/05/2016  . Elevated troponin 04/05/2016  . Right ACL tear 11/16/2015  . ACL (anterior cruciate ligament) rupture 05/05/2013  . 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 Intermittent explosive disorder 10/03/2011    Past Surgical History:  Procedure Laterality Date  . ANTERIOR CRUCIATE LIGAMENT REPAIR     Left knee Dr. Edger House  . HERNIA REPAIR          Home Medications    Prior to Admission medications   Medication Sig Start Date End Date Taking? Authorizing Provider  amphetamine-dextroamphetamine (ADDERALL XR) 30 MG 24 hr capsule Take 1 capsule (30 mg total) by mouth daily. 06/20/17  Yes Myrlene Broker, MD  lisinopril (PRINIVIL,ZESTRIL) 10 MG tablet Take 10 mg by mouth daily. 11/13/14  Yes [provider]  omeprazole (PRILOSEC)  20 MG capsule Take 20 mg by mouth daily.   Yes [provider]  amphetamine-dextroamphetamine (ADDERALL XR) 30 MG 24 hr capsule Take 1 capsule (30 mg total) by mouth daily. 06/20/17   Myrlene Broker, MD  amphetamine-dextroamphetamine (ADDERALL XR) 30 MG 24 hr capsule Take 1 capsule (30 mg total) by mouth daily. 06/20/17   Myrlene Broker, MD    Family History Family History  Problem Relation Age of Onset  . ADD / ADHD Brother   . ADD / ADHD Maternal Aunt   . Drug abuse Maternal Uncle   . Alcohol abuse Maternal Uncle   . Schizophrenia Maternal Uncle   . Anxiety disorder Neg Hx   . Bipolar disorder Neg Hx   . Dementia Neg Hx   . Depression Neg Hx   . OCD Neg Hx   . Paranoid behavior Neg Hx   . Seizures Neg Hx   . Sexual abuse Neg Hx   . Physical abuse Neg Hx     Social History Social History   Tobacco Use  . Smoking status: Current Some Day Smoker  . Smokeless tobacco: Never Used  Substance Use Topics  . Alcohol use: Yes  . Drug use: Yes    Types: Marijuana     Allergies   Bee venom   Review of Systems Review of Systems  Constitutional:       Per HPI, otherwise negative  HENT:       Per HPI, otherwise  negative  Respiratory:       Per HPI, otherwise negative  Cardiovascular:       Per HPI, otherwise negative  Gastrointestinal: Negative for vomiting.  Endocrine:       Negative aside from HPI  Genitourinary:       Neg aside from HPI   Musculoskeletal:       Per HPI, otherwise negative  Skin: Negative.   Neurological: Negative for syncope.     Physical Exam Updated Vital Signs BP (!) 148/112   Pulse (!) 106   Temp 98 F (36.7 C) (Oral)   Resp 20   Ht 5\' 7"  (1.702 m)   Wt 95.3 kg   SpO2 97%   BMI 32.89 kg/m   Physical Exam  Constitutional: He is oriented to person, place, and time. He appears well-developed. No distress.  HENT:  Head: Normocephalic and atraumatic.  Eyes: Conjunctivae and EOM are normal.  Cardiovascular: Normal rate,  regular rhythm and normal pulses. Exam reveals no friction rub.  Pulmonary/Chest: Effort normal. No stridor. No respiratory distress.  Abdominal: He exhibits no distension.  Musculoskeletal: He exhibits no edema.  Neurological: He is alert and oriented to person, place, and time.  Skin: Skin is warm and dry.  Psychiatric: He has a normal mood and affect.  Nursing note and vitals reviewed.    ED Treatments / Results  Labs (all labs ordered are listed, but only abnormal results are displayed) Labs Reviewed  CBC - Abnormal; Notable for the following components:      Result Value   RBC 4.18 (*)    All other components within normal limits  BASIC METABOLIC PANEL  TROPONIN I  SEDIMENTATION RATE    EKG EKG with sinus rhythm, rate 90, minor wander, otherwise unremarkable EKG Radiology Dg Chest 2 View  Result Date: 07/18/2018 CLINICAL DATA:  Left-sided chest pain.  Neck pain. EXAM: CHEST - 2 VIEW COMPARISON:  April 05, 2016 FINDINGS: The heart size and mediastinal contours are within normal limits. Both lungs are clear. The visualized skeletal structures are unremarkable. IMPRESSION: No active cardiopulmonary disease. Electronically Signed   By: Gerome Sam III M.D   On: 07/18/2018 19:11    Procedures Procedures (including critical care time)  Medications Ordered in ED Medications  ketorolac (TORADOL) 30 MG/ML injection 15 mg (15 mg Intravenous Given 07/18/18 1924)     Initial Impression / Assessment and Plan / ED Course  I have reviewed the triage vital signs and the nursing notes.  Pertinent labs & imaging results that were available during my care of the patient were reviewed by me and considered in my medical decision making (see chart for details).     9:28 PM His pain has resolved. Labs reassuring I discussed all findings with him, his wife and cousin. Patient has a history of pericarditis, and this may represent an early recurrence, but no evidence for substantial  inflammation with resolution of pain after 1 dose of anti-inflammatories, no elevated CRP. No elevated troponin, no EKG changes consistent with atypical ACS. Patient appropriate for discharge, notes that he will follow-up in the coming days with his cardiologist, and acknowledges return precautions.   Final Clinical Impressions(s) / ED Diagnoses  Atypical chest pain   Gerhard Munch, MD 07/18/18 2129

## 2018-11-21 ENCOUNTER — Encounter (HOSPITAL_COMMUNITY): Payer: Self-pay

## 2018-11-21 DIAGNOSIS — R197 Diarrhea, unspecified: Secondary | ICD-10-CM | POA: Diagnosis not present

## 2018-11-21 DIAGNOSIS — J111 Influenza due to unidentified influenza virus with other respiratory manifestations: Secondary | ICD-10-CM | POA: Insufficient documentation

## 2018-11-21 DIAGNOSIS — I10 Essential (primary) hypertension: Secondary | ICD-10-CM | POA: Insufficient documentation

## 2018-11-21 DIAGNOSIS — F17210 Nicotine dependence, cigarettes, uncomplicated: Secondary | ICD-10-CM | POA: Diagnosis not present

## 2018-11-21 DIAGNOSIS — R05 Cough: Secondary | ICD-10-CM | POA: Diagnosis present

## 2018-11-21 DIAGNOSIS — Z79899 Other long term (current) drug therapy: Secondary | ICD-10-CM | POA: Insufficient documentation

## 2018-11-21 MED ORDER — ACETAMINOPHEN 500 MG PO TABS
ORAL_TABLET | ORAL | Status: AC
  Filled 2018-11-21: qty 2

## 2018-11-21 MED ORDER — ACETAMINOPHEN 500 MG PO TABS
1000.0000 mg | ORAL_TABLET | Freq: Once | ORAL | Status: AC
  Administered 2018-11-21: 1000 mg via ORAL

## 2018-11-21 NOTE — ED Triage Notes (Signed)
Pt with flu like symptoms, cough, fever onset today.  Had tylenol approx 5 pm. No vomiting, but having diarrhea x 3 today

## 2018-11-22 ENCOUNTER — Other Ambulatory Visit: Payer: Self-pay

## 2018-11-22 ENCOUNTER — Emergency Department (HOSPITAL_COMMUNITY)
Admission: EM | Admit: 2018-11-22 | Discharge: 2018-11-22 | Disposition: A | Discharge status: Home / Self Care | Payer: Medicaid Other | Attending: Emergency Medicine | Admitting: Emergency Medicine

## 2018-11-22 DIAGNOSIS — R197 Diarrhea, unspecified: Secondary | ICD-10-CM

## 2018-11-22 DIAGNOSIS — R69 Illness, unspecified: Secondary | ICD-10-CM

## 2018-11-22 DIAGNOSIS — J111 Influenza due to unidentified influenza virus with other respiratory manifestations: Secondary | ICD-10-CM

## 2018-11-22 MED ORDER — ONDANSETRON HCL 4 MG PO TABS: 4.0000 mg | ORAL_TABLET | Freq: Four times a day (QID) | ORAL | 0 refills | Status: DC | PRN

## 2018-11-22 MED ORDER — LOPERAMIDE HCL 2 MG PO CAPS: 2.0000 mg | ORAL_CAPSULE | Freq: Four times a day (QID) | ORAL | 0 refills | Status: DC | PRN

## 2018-11-22 MED ORDER — LOPERAMIDE HCL 2 MG PO CAPS
4.0000 mg | ORAL_CAPSULE | Freq: Once | ORAL | Status: AC
  Administered 2018-11-22: 4 mg via ORAL
  Filled 2018-11-22: qty 2

## 2018-11-22 NOTE — ED Notes (Signed)
Pt ambulatory to waiting room. Pt verbalized understanding of discharge instructions.   

## 2018-11-22 NOTE — Discharge Instructions (Addendum)
Rest.  Drink plenty of fluids.  Take acetaminophen or ibuprofen as needed for fever or body aches.

## 2018-11-22 NOTE — ED Provider Notes (Signed)
[facility] EMERGENCY DEPARTMENT Provider Note   CSN: [id number] Arrival date & time: 11/21/18  2331     History   Chief Complaint Chief Complaint  Patient presents with  . Fever    HPI Joseph Blair is a 28 y.o. male.  The history is provided by the patient.  Fever  He has history of hypertension, attention deficit hyperactivity disorder, GERD and comes in with onset yesterday of fever to 101.7, chills, sweats.  There is been associated sore throat and nonproductive cough.  There is been some mild nausea and he has been having diarrhea.  He has had 3 loose bowel movements and continues to feel like he is going to have more diarrhea.  There is generalized body aches.  He denies any sick contacts, but did not receive the influenza immunization this season.  No history of travel to [place] or exposure to people who have traveled to [place].  Past Medical History:  Diagnosis Date  . ADHD (attention deficit hyperactivity disorder)   . GERD (gastroesophageal reflux disease)   . Hypertension   . Pancreas disorder  two years ago   Hospitalized for Pancreatitis    Patient Active Problem List   Diagnosis Date Noted  . Acute myopericarditis 04/05/2016  . Chest pain 04/05/2016  . Elevated troponin 04/05/2016  . Right ACL tear 11/16/2015  . ACL (anterior cruciate ligament) rupture 05/05/2013  . 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 Intermittent explosive disorder 10/03/2011    Past Surgical History:  Procedure Laterality Date  . ANTERIOR CRUCIATE LIGAMENT REPAIR     Left knee Dr. Edger House  . HERNIA REPAIR          Home Medications    Prior to Admission medications   Medication Sig Start Date End Date Taking? Authorizing Provider  amphetamine-dextroamphetamine (ADDERALL XR) 30 MG 24 hr capsule Take 1 capsule (30 mg total) by mouth daily. 06/20/17   Myrlene Broker, MD  amphetamine-dextroamphetamine (ADDERALL XR) 30 MG 24 hr capsule  Take 1 capsule (30 mg total) by mouth daily. 06/20/17   Myrlene Broker, MD  amphetamine-dextroamphetamine (ADDERALL XR) 30 MG 24 hr capsule Take 1 capsule (30 mg total) by mouth daily. 06/20/17   Myrlene Broker, MD  lisinopril (PRINIVIL,ZESTRIL) 10 MG tablet Take 10 mg by mouth daily. 11/13/14   [provider]  omeprazole (PRILOSEC) 20 MG capsule Take 20 mg by mouth daily.    [provider]    Family History Family History  Problem Relation Age of Onset  . ADD / ADHD Brother   . ADD / ADHD Maternal Aunt   . Drug abuse Maternal Uncle   . Alcohol abuse Maternal Uncle   . Schizophrenia Maternal Uncle   . Anxiety disorder Neg Hx   . Bipolar disorder Neg Hx   . Dementia Neg Hx   . Depression Neg Hx   . OCD Neg Hx   . Paranoid behavior Neg Hx   . Seizures Neg Hx   . Sexual abuse Neg Hx   . Physical abuse Neg Hx     Social History Social History   Tobacco Use  . Smoking status: Current Some Day Smoker  . Smokeless tobacco: Never Used  Substance Use Topics  . Alcohol use: Yes  . Drug use: Yes    Types: Marijuana     Allergies   Bee venom   Review of Systems Review of Systems  Constitutional: Positive for fever.  All  other systems reviewed and are negative.    Physical Exam Updated Vital Signs BP (!) 151/105 (BP Location: Right Arm)   Pulse 97   Temp 99.2 F (37.3 C) (Oral)   Resp 20   Ht 5\' 6"  (1.676 m)   Wt 99.8 kg   SpO2 96%   BMI 35.51 kg/m   Physical Exam Vitals signs and nursing note reviewed.    28 year old male, resting comfortably and in no acute distress. Vital signs are Significant for elevated blood pressure. Oxygen saturation is 96%, which is normal. Head is normocephalic and atraumatic. PERRLA, EOMI. Oropharynx is clear. Neck is nontender and supple without adenopathy or JVD. Back is nontender and there is no CVA tenderness. Lungs are clear without rales, wheezes, or rhonchi. Chest is nontender. Heart has regular rate and  rhythm without murmur. Abdomen is soft, flat, nontender without masses or hepatosplenomegaly and peristalsis is normoactive. Extremities have no cyanosis or edema, full range of motion is present. Skin is warm and dry without rash. Neurologic: Mental status is normal, cranial nerves are intact, there are no motor or sensory deficits.  ED Treatments / Results   Procedures Procedures   Medications Ordered in ED Medications  acetaminophen (TYLENOL) 500 MG tablet (has no administration in time range)  acetaminophen (TYLENOL) tablet 1,000 mg (1,000 mg Oral Given 11/21/18 2345)     Initial Impression / Assessment and Plan / ED Course  I have reviewed the triage vital signs and the nursing notes.  Influenza-like illness.  Patient was offered oseltamivir prescription.after informed discussion regarding risks and benefits of oseltamivir, he declined the prescription based on presence of diarrhea and likelihood of diarrhea as a side effect.  He is discharged with instructions to pursue symptomatic treatment.  Prescriptions given for loperamide for diarrhea and ondansetron for nausea.  Return precautions discussed.  Final Clinical Impressions(s) / ED Diagnoses   Final diagnoses:  Influenza-like illness  Diarrhea of presumed infectious origin    ED Discharge Orders         Ordered    loperamide (IMODIUM) 2 MG capsule  4 times daily PRN     11/22/18 0500    ondansetron (ZOFRAN) 4 MG tablet  Every 6 hours PRN     11/22/18 0501           Dione Booze, MD 11/22/18 [phone]

## 2018-11-24 ENCOUNTER — Encounter: Payer: Self-pay | Admitting: Internal Medicine

## 2019-02-02 ENCOUNTER — Ambulatory Visit (INDEPENDENT_AMBULATORY_CARE_PROVIDER_SITE_OTHER): Payer: Medicaid Other | Admitting: Gastroenterology

## 2019-02-02 ENCOUNTER — Other Ambulatory Visit: Payer: Self-pay

## 2019-02-02 ENCOUNTER — Encounter: Payer: Self-pay | Admitting: Gastroenterology

## 2019-02-02 DIAGNOSIS — R945 Abnormal results of liver function studies: Secondary | ICD-10-CM | POA: Diagnosis not present

## 2019-02-02 DIAGNOSIS — R7989 Other specified abnormal findings of blood chemistry: Secondary | ICD-10-CM | POA: Insufficient documentation

## 2019-02-02 NOTE — Progress Notes (Signed)
CC'ED TO PCP 

## 2019-02-02 NOTE — Progress Notes (Signed)
Primary Care Physician:  Avon GullyFanta, Tesfaye, MD Primary GI:     Patient Location: Home  Provider Location: [facility] office  Reason for Visit: abnormal LFTs  Persons present on the virtual encounter, with roles: Patient, myself (provider),Martina Elwyn ReachBooth LPN (updated meds and allergies)  Total time (minutes) spent on medical discussion: 20 minutes  Due to COVID-19, visit was conducted using Facetime method.  Visit was requested by patient.  Virtual Visit via Facetime  I connected with Joseph Hillockharlie D Howson on 02/02/19 at  8:00 AM EDT by Facetime and verified that I am speaking with the correct person using two identifiers.   I discussed the limitations, risks, security and privacy concerns of performing an evaluation and management service by telephone/video and the availability of in person appointments. I also discussed with the patient that there may be a patient responsible charge related to this service. The patient expressed understanding and agreed to proceed.   HPI:   Joseph Blair is a 28 y.o. male who presents for virtual visit regarding abnormal LFTs at the request of Dr. Felecia ShellingFanta.  Patient has a history of ADHD, GERD, hypertension.  Back in February he was seen in the emergency department with influenza-like illness.  Temp of 101.7.  No labs or testing done at the time.  He saw his family doctor for a physical in February as well.  He was noted to have abnormal LFTs as outlined below.  Hepatitis B surface antibody positive.  Hepatitis C antibody negative.  No other work-up has been done.  It is notable that the patient does consume significant amounts of alcohol.  At the time of his blood work in February he states he was drinking every day and pretty heavily.  He has now cut back somewhat.  He does not drink daily but may drink every other day or so, 1 pint of brown liquor when he does drink.  He denies any abdominal pain.  His reflux is well controlled on omeprazole.  No bowel  concerns.  No blood in the stool or melena.  He denies any history of intranasal or IV drug use in the past or present.  No prior blood transfusions.  He has about 14 tattoos all of which were done in a sterile environment.  Family history negative for liver disease.  He has a paternal aunt who died of colon cancer at age 29.   Current Outpatient Medications  Medication Sig Dispense Refill   lisinopril (PRINIVIL,ZESTRIL) 10 MG tablet Take 10 mg by mouth daily.  0   omeprazole (PRILOSEC) 20 MG capsule Take 20 mg by mouth daily.     No current facility-administered medications for this visit.     Past Medical History:  Diagnosis Date   ADHD (attention deficit hyperactivity disorder)    GERD (gastroesophageal reflux disease)    Hypertension    Pancreas disorder 2013   patient reports he was told he might have pancreatitis. No elevated lipase or imaging to support per EPIC review    Past Surgical History:  Procedure Laterality Date   ANTERIOR CRUCIATE LIGAMENT REPAIR     Left knee Dr. Edger HouseMcGinley   HERNIA REPAIR      Family History  Problem Relation Age of Onset   ADD / ADHD Brother    ADD / ADHD Maternal Aunt    Drug abuse Maternal Uncle    Alcohol abuse Maternal Uncle    Schizophrenia Maternal Uncle    Colon cancer Paternal Aunt  12 years old   Anxiety disorder Neg Hx    Bipolar disorder Neg Hx    Dementia Neg Hx    Depression Neg Hx    OCD Neg Hx    Paranoid behavior Neg Hx    Seizures Neg Hx    Sexual abuse Neg Hx    Physical abuse Neg Hx    Liver disease Neg Hx     Social History   Socioeconomic History   Marital status: Married    Spouse name: Not on file   Number of children: Not on file   Years of education: Not on file   Highest education level: Not on file  Occupational History   Not on file  Social Needs   Financial resource strain: Not on file   Food insecurity:    Worry: Not on file    Inability: Not on file    Transportation needs:    Medical: Not on file    Non-medical: Not on file  Tobacco Use   Smoking status: Current Some Day Smoker    Types: Cigars   Smokeless tobacco: Never Used  Substance and Sexual Activity   Alcohol use: Yes    Comment: occ brown liquor   Drug use: Not Currently    Types: Marijuana   Sexual activity: Yes  Lifestyle   Physical activity:    Days per week: Not on file    Minutes per session: Not on file   Stress: Not on file  Relationships   Social connections:    Talks on phone: Not on file    Gets together: Not on file    Attends religious service: Not on file    Active member of club or organization: Not on file    Attends meetings of clubs or organizations: Not on file    Relationship status: Not on file   Intimate partner violence:    Fear of current or ex partner: Not on file    Emotionally abused: Not on file    Physically abused: Not on file    Forced sexual activity: Not on file  Other Topics Concern   Not on file  Social History Narrative   Not on file      ROS:  General: Negative for anorexia, weight loss, fever, chills, fatigue, weakness. Eyes: Negative for vision changes.  ENT: Negative for hoarseness, difficulty swallowing , nasal congestion. CV: Negative for chest pain, angina, palpitations, dyspnea on exertion, peripheral edema.  Respiratory: Negative for dyspnea at rest, dyspnea on exertion, cough, sputum, wheezing.  GI: See history of present illness. GU:  Negative for dysuria, hematuria, urinary incontinence, urinary frequency, nocturnal urination.  MS: Knee pain, no low back pain.  Derm: Negative for rash or itching.  Neuro: Negative for weakness, abnormal sensation, seizure, frequent headaches, memory loss, confusion.  Psych: Negative for anxiety, depression, suicidal ideation, hallucinations.  Endo: Negative for unusual weight change.  Heme: Negative for bruising or bleeding. Allergy: Negative for rash or hives.     Observations/Objective: Very pleasant 28 year old gentleman in no acute distress.  No evidence of jaundice.  Polite and cooperative.  Otherwise exam not available.  Labs from 12/04/2018: Triglycerides 228, total cholesterol 218, glucose 90, BUN 10, creatinine 0.74, sodium 138, potassium 4, albumin 4.3, A1c 5.5, total bilirubin 0.4, alkaline phosphatase 73, AST 101, ALT 151, white blood cell count 8000, hemoglobin 14.2, platelets 238,000, hepatitis B surface reactive, hepatitis B surface antibody reactive, hepatitis C antibody nonreactive  Assessment and Plan: Pleasant  28 year old gentleman with history of abnormal transaminases in the setting of alcohol use.  Suspect combination of alcohol and fatty liver however he needs to have limited work-up at least including ultrasound once COVID-19 crisis improves.  Discussed at length, patient needs to cut way back on alcohol use, goal of complete cessation of possible.  We discussed long-term issues with alcohol use.  We discussed his condition could be reversible if he stops drinking.  Around July we will try to get an abdominal ultrasound and labs including LFTs, iron/TIBC/ferritin, ceruloplasmin, autoimmune serologies.  We will plan to see him back after those are completed.  Follow Up Instructions:    I discussed the assessment and treatment plan with the patient. The patient was provided an opportunity to ask questions and all were answered. The patient agreed with the plan and demonstrated an understanding of the instructions. AVS mailed to patient's home address.   The patient was advised to call back or seek an in-person evaluation if the symptoms worsen or if the condition fails to improve as anticipated.  I provided 20 minutes of virtual face-to-face time during this encounter.   Tana Coast, PA-C

## 2019-02-02 NOTE — Patient Instructions (Signed)
1. We will update your labs and get an ultrasound of your liver, most likely around July once this coronavirus issue has settled down. 2. We will plan to see you back after labs and ultrasound are done.  I will have them make an office visit for August. 3. In the meantime please try to cut way back on your alcohol consumption, preferably stop altogether if possible.     Alcoholic Liver Disease Alcoholic liver disease refers to liver damage that is caused by drinking a lot of alcohol over a long period of time. The liver is an organ that:  Helps to divide (break down) and absorb fats and other nutrients in the blood.  Helps to remove harmful substances from the blood.  Makes the parts of the blood that help to form clots and prevent excessive bleeding. Damage may cause the liver to stop working properly. There are three main types of alcoholic liver disease, and they usually occur in the following order. 1. Fatty liver disease. This is considered the early stage of alcoholic liver disease. It is a condition in which too much fat has built up in the liver cells. In many cases, fatty liver disease does not cause any symptoms. It is often diagnosed when lab tests are done for other reasons. 2. Alcoholic hepatitis. This is liver inflammation that decreases the liver's ability to function normally. 3. Alcoholic cirrhosis. Cirrhosis is long-term (chronic) liver injury. Having cirrhosis means that many healthy liver cells have been replaced by scar tissue. This prevents blood from flowing through the liver, which makes it difficult for the liver to function. YOU DO NOT HAVE THIS BUT WE WANT TO PREVENT THIS! Symptoms of this condition usually get worse (progress) over time if alcohol use continues. Treatment requires stopping all alcohol intake. What are the causes? Alcoholic liver disease is caused by long-term (chronic) heavy alcohol use. The liver filters alcohol out of the bloodstream. When alcohol  gets broken down in the liver, it releases poisonous (toxic) chemicals that damage liver cells. What increases the risk? This condition is more likely to develop in:  Women.  People who have a family history of alcoholic liver disease.  People who have poor nutrition.  People who are obese.  People who drink a lot of alcohol, especially people who often drink a lot during short periods of time (binge drink).  People who have an underlying liver disease such as hepatitis B or hepatitis C.  People who lack certain nutrients, such as folate or thiamine (have nutrient deficiencies). What are the signs or symptoms? Most people do not have symptoms in the early stages of this disease. If early symptoms do develop, they may include:  Loss of appetite.  Nausea and vomiting. Symptoms of moderate disease include:  Loss of appetite.  Nausea and vomiting.  Diarrhea.  Weight loss without trying.  Fatigue.  Yellowing of the skin and the whites of the eyes (jaundice).  Pain and swelling in the abdomen.  Fever. Symptoms of advanced disease include:  Weight loss and muscle loss.  Itchy skin.  Buildup of fluid in the abdomen (ascites).  Swelling of the feet and ankles (edema).  Nosebleeds or bleeding gums.  Bloody bowel movements.  Enlarged fingertips (clubbing).  Trouble concentrating.  Trouble sleeping.  Mood changes.  Agitation.  Confusion. Some people do not have symptoms until the condition becomes severe. Symptoms often get worse right after a period of heavy drinking. How is this diagnosed? This condition may be  diagnosed based on:  A physical exam.  Blood tests.  Tests that create detailed images of the body. These may include: ? Liver ultrasound. ? CT scan. ? MRI.  A liver biopsy. For this test, a small sample of liver tissue is removed and checked for signs of damage. How is this treated? The most important part of treatment is to stop drinking  alcohol. If you are addicted to alcohol, your health care provider will help you make a plan to quit. This plan may involve:  Taking medicine to decrease unpleasant symptoms that are caused by stopping or decreasing alcohol use (withdrawal symptoms).  Entering a treatment program to help you stop drinking.  Joining a support group. Treatment for alcoholic liver disease may also include:  Nutritional therapy. Your health care provider or a diet and nutrition specialist (dietitian) may recommend: ? Eating a healthy diet. ? Taking vitamins. ? Eating foods that contain a lot of zinc or B vitamins such as folate, thiamine, or pyridoxine.  Steroid medicines to reduce liver inflammation. These may be recommended if your disease is severe.  Receiving a donated liver (liver transplant). This is only done in very severe cases, and only for people who have completely stopped drinking and can commit to never drinking alcohol again. Follow these instructions at home:   Do not drink alcohol. Follow your treatment plan, and work with your health care provider as needed.  Consider joining an alcohol support group. These groups can provide emotional support and guidance.  Take over-the-counter and prescription medicines only as told by your health care provider. These include vitamins and supplements.  Do not use medicines or eat foods that contain alcohol unless told by your health care provider.  Follow instructions from your health care provider or dietitian about eating a healthy diet.  Keep all follow-up visits as told by your health care provider. This is important. Contact a health care provider if:  You develop a fever.  Your skin color becomes more yellow, pale, or dark.  You develop headaches. Get help right away if:  You vomit blood.  You have bright red blood in your stool.  You have black, tarry stools.  You have trouble: ? Thinking. ? Walking. ? Balancing. ? Breathing.  Summary  Alcoholic liver disease refers to liver damage that is caused by drinking a lot of alcohol over a long period of time.  Symptoms of this condition usually get worse (progress) over time if alcohol use continues.  Your health care provider will do a physical exam and tests to diagnose your condition.  The most important part of treatment is to stop drinking alcohol. Follow your treatment plan, and work with your health care provider as needed. This information is not intended to replace advice given to you by your health care provider. Make sure you discuss any questions you have with your health care provider. Document Released: 10/22/2014 Document Revised: 06/13/2017 Document Reviewed: 06/13/2017 Elsevier Interactive Patient Education  2019 [employer]

## 2019-02-02 NOTE — Addendum Note (Signed)
Addended by: Tiffany Kocher on: 02/02/2019 10:01 AM   Modules accepted: Level of Service

## 2019-02-03 ENCOUNTER — Encounter: Payer: Self-pay | Admitting: Internal Medicine

## 2019-02-11 ENCOUNTER — Telehealth: Payer: Self-pay | Admitting: *Deleted

## 2019-02-11 NOTE — Telephone Encounter (Signed)
U/s scheduled for 7/6 at 9:30am. Letter mailed with appt information.

## 2019-03-23 ENCOUNTER — Telehealth: Payer: Self-pay | Admitting: *Deleted

## 2019-03-23 NOTE — Telephone Encounter (Signed)
PA done via [employer] website for ultrasound. Authorization Number: [id number] Dates 03/23/2019-09/19/2019

## 2019-03-24 ENCOUNTER — Encounter: Payer: Self-pay | Admitting: *Deleted

## 2019-04-13 ENCOUNTER — Telehealth: Payer: Self-pay | Admitting: Internal Medicine

## 2019-04-13 NOTE — Telephone Encounter (Signed)
RECALL FOR ABDOMINAL ULTRASOUND °

## 2019-04-13 NOTE — Telephone Encounter (Signed)
Patient already scheduled for July for u/s

## 2019-04-20 ENCOUNTER — Ambulatory Visit (HOSPITAL_COMMUNITY): Payer: Medicaid Other

## 2019-06-09 ENCOUNTER — Ambulatory Visit: Payer: Medicaid Other | Admitting: Gastroenterology

## 2019-06-09 ENCOUNTER — Encounter: Payer: Self-pay | Admitting: Internal Medicine

## 2019-06-09 ENCOUNTER — Telehealth: Payer: Self-pay | Admitting: Internal Medicine

## 2019-06-09 NOTE — Telephone Encounter (Signed)
Patient was a no show and letter sent  °

## 2019-12-20 ENCOUNTER — Emergency Department (HOSPITAL_COMMUNITY)
Admission: EM | Admit: 2019-12-20 | Discharge: 2019-12-20 | Disposition: A | Discharge status: Home / Self Care | Payer: Medicaid Other | Attending: Emergency Medicine | Admitting: Emergency Medicine

## 2019-12-20 ENCOUNTER — Emergency Department (HOSPITAL_COMMUNITY): Payer: Medicaid Other

## 2019-12-20 ENCOUNTER — Encounter (HOSPITAL_COMMUNITY): Payer: Self-pay | Admitting: *Deleted

## 2019-12-20 ENCOUNTER — Other Ambulatory Visit: Payer: Self-pay

## 2019-12-20 DIAGNOSIS — I10 Essential (primary) hypertension: Secondary | ICD-10-CM | POA: Insufficient documentation

## 2019-12-20 DIAGNOSIS — F909 Attention-deficit hyperactivity disorder, unspecified type: Secondary | ICD-10-CM | POA: Insufficient documentation

## 2019-12-20 DIAGNOSIS — F17210 Nicotine dependence, cigarettes, uncomplicated: Secondary | ICD-10-CM | POA: Diagnosis not present

## 2019-12-20 DIAGNOSIS — Z79899 Other long term (current) drug therapy: Secondary | ICD-10-CM | POA: Insufficient documentation

## 2019-12-20 DIAGNOSIS — M25561 Pain in right knee: Secondary | ICD-10-CM | POA: Insufficient documentation

## 2019-12-20 MED ORDER — IBUPROFEN 600 MG PO TABS: 600.0000 mg | ORAL_TABLET | Freq: Four times a day (QID) | ORAL | 0 refills | Status: DC | PRN

## 2019-12-20 MED ORDER — IBUPROFEN 400 MG PO TABS
600.0000 mg | ORAL_TABLET | Freq: Once | ORAL | Status: AC
  Administered 2019-12-20: 17:00:00 600 mg via ORAL
  Filled 2019-12-20: qty 2

## 2019-12-20 MED ORDER — HYDROCODONE-ACETAMINOPHEN 5-325 MG PO TABS
1.0000 | ORAL_TABLET | Freq: Once | ORAL | Status: AC
  Administered 2019-12-20: 17:00:00 1 via ORAL
  Filled 2019-12-20: qty 1

## 2019-12-20 MED ORDER — HYDROCODONE-ACETAMINOPHEN 5-325 MG PO TABS: 1.0000 | ORAL_TABLET | Freq: Four times a day (QID) | ORAL | 0 refills | Status: DC | PRN

## 2019-12-20 NOTE — ED Triage Notes (Signed)
Pt with right knee pain, old injury 5 years ago.  Pt walking and heard a loud pop to right knee yesterday and unable to put weight on knee.

## 2019-12-20 NOTE — ED Provider Notes (Addendum)
[facility] Provider Note   CSN: [id number] Arrival date & time: 12/20/19  1317     History Chief Complaint  Patient presents with  . Knee Pain    Joseph Blair is a 29 y.o. male.  HPI      Joseph Blair is a 29 y.o. male, with a history of HTN, GERD, presenting to the ED with right knee pain beginning yesterday.  Patient was walking, heard a pop in the right knee, followed by immediate pain along the medial aspect of the knee.  Pain is aching, severe, worse with movement of the knee, nonradiating. He has not tried any therapies for his pain. He states he has seen orthopedic surgeon Dr. Aline Brochure several years ago, was told he needed a knee replacement on the right, but did not think he would be able to afford the physical therapy after the surgery. Denies numbness, weakness, falls, hip pain or any other complaints.   Past Medical History:  Diagnosis Date  . ADHD (attention deficit hyperactivity disorder)   . GERD (gastroesophageal reflux disease)   . Hypertension   . Pancreas disorder 2013   patient reports he was told he might have pancreatitis. No elevated lipase or imaging to support per EPIC review    Patient Active Problem List   Diagnosis Date Noted  . Abnormal LFTs 02/02/2019  . Acute myopericarditis 04/05/2016  . Chest pain 04/05/2016  . Elevated troponin 04/05/2016  . Right ACL tear 11/16/2015  . ACL (anterior cruciate ligament) rupture 05/05/2013  . 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 Intermittent explosive disorder 10/03/2011    Past Surgical History:  Procedure Laterality Date  . ANTERIOR CRUCIATE LIGAMENT REPAIR     Left knee Dr. Garfield Cornea  . HERNIA REPAIR         Family History  Problem Relation Age of Onset  . ADD / ADHD Brother   . ADD / ADHD Maternal Aunt   . Drug abuse Maternal Uncle   . Alcohol abuse Maternal Uncle   . Schizophrenia Maternal Uncle   . Colon cancer  Paternal Aunt        30 years old  . Anxiety disorder Neg Hx   . Bipolar disorder Neg Hx   . Dementia Neg Hx   . Depression Neg Hx   . OCD Neg Hx   . Paranoid behavior Neg Hx   . Seizures Neg Hx   . Sexual abuse Neg Hx   . Physical abuse Neg Hx   . Liver disease Neg Hx     Social History   Tobacco Use  . Smoking status: Current Some Day Smoker    Types: Cigars  . Smokeless tobacco: Never Used  Substance Use Topics  . Alcohol use: Yes    Comment: prior to 11/2018 was drinking heavy etoh daily, now doesn't drink every day (now pint brown liqour a day)  . Drug use: Not Currently    Types: Marijuana    Home Medications Prior to Admission medications   Medication Sig Start Date End Date Taking? Authorizing Provider  HYDROcodone-acetaminophen (NORCO/VICODIN) 5-325 MG tablet Take 1-2 tablets by mouth every 6 (six) hours as needed for severe pain. 12/20/19   Marnesha Gagen C, PA-C  ibuprofen (ADVIL) 600 MG tablet Take 1 tablet (600 mg total) by mouth every 6 (six) hours as needed. 12/20/19   Willma Obando C, PA-C  lisinopril (PRINIVIL,ZESTRIL) 10 MG tablet Take 10 mg by mouth daily. 11/13/14  [provider]  omeprazole (PRILOSEC) 20 MG capsule Take 20 mg by mouth daily.    [provider]    Allergies    Bee venom  Review of Systems   Review of Systems  Musculoskeletal: Positive for arthralgias.  Neurological: Negative for weakness and numbness.    Physical Exam Updated Vital Signs BP (!) 151/100 (BP Location: Left Arm)   Pulse 96   Temp 98.7 F (37.1 C) (Oral)   Resp 16   Ht 5' 6.5" (1.689 m)   Wt 101.2 kg   SpO2 99%   BMI 35.45 kg/m   Physical Exam Vitals and nursing note reviewed.  Constitutional:      General: He is not in acute distress.    Appearance: He is well-developed. He is not diaphoretic.  HENT:     Head: Normocephalic and atraumatic.  Eyes:     Conjunctiva/sclera: Conjunctivae normal.  Cardiovascular:     Rate and Rhythm: Normal rate  and regular rhythm.     Pulses: Normal pulses.          Dorsalis pedis pulses are 2+ on the right side.       Posterior tibial pulses are 2+ on the right side.  Pulmonary:     Effort: Pulmonary effort is normal.  Musculoskeletal:     Cervical back: Neck supple.     Comments: Tenderness along the medial aspect of the right knee without noted swelling, color change, or deformity. Pain elicited in the medial knee with both valgus and varus stress.  No noted laxity.  Patella appears to be in correct anatomical position. Pain with range of motion of the right knee. No tenderness extending proximally or distally from the knee.  Skin:    General: Skin is warm and dry.     Capillary Refill: Capillary refill takes less than 2 seconds.     Coloration: Skin is not pale.  Neurological:     Mental Status: He is alert.     Comments: Distal strength in the right leg 5/5. Sensation to light touch grossly intact in the right lower extremity.  Psychiatric:        Behavior: Behavior normal.     ED Results / Procedures / Treatments   Labs (all labs ordered are listed, but only abnormal results are displayed) Labs Reviewed - No data to display  EKG None  Radiology DG Knee Complete 4 Views Right  Result Date: 12/20/2019 CLINICAL DATA:  Right knee injury with loud pop wall walking yesterday. Knee pain and inability to bear weight. Initial encounter. EXAM: RIGHT KNEE - COMPLETE 4+ VIEW COMPARISON:  04/09/2013 FINDINGS: No evidence of fracture, dislocation, or joint effusion. No evidence of arthropathy or other focal bone abnormality. Soft tissues are unremarkable. IMPRESSION: Negative. Electronically Signed   By: Danae Orleans M.D.   On: 12/20/2019 14:17    Procedures Procedures (including critical care time)  Medications Ordered in ED Medications  HYDROcodone-acetaminophen (NORCO/VICODIN) 5-325 MG per tablet 1 tablet (has no administration in time range)  ibuprofen (ADVIL) tablet 600 mg (has no  administration in time range)    ED Course  I have reviewed the triage vital signs and the nursing notes.  Pertinent labs & imaging results that were available during my care of the patient were reviewed by me and considered in my medical decision making (see chart for details).    MDM Rules/Calculators/A&P  Patient presents with right knee pain beginning yesterday.  No evidence of neurovascular compromise. Placed in knee immobilizer, given crutches, and advised to follow-up with orthopedics. The patient was given instructions for home care as well as return precautions. Patient voices understanding of these instructions, accepts the plan, and is comfortable with discharge.  I reviewed and interpreted the patient's radiological studies.   Hypertension noted.  Doubt hypertensive emergency.  He will need to follow-up with a PCP on this matter.  Final Clinical Impression(s) / ED Diagnoses Final diagnoses:  Acute pain of right knee    Rx / DC Orders ED Discharge Orders         Ordered    ibuprofen (ADVIL) 600 MG tablet  Every 6 hours PRN     12/20/19 1650    HYDROcodone-acetaminophen (NORCO/VICODIN) 5-325 MG tablet  Every 6 hours PRN     12/20/19 1650           Anselm Pancoast, PA-C 12/20/19 1700    Anselm Pancoast, PA-C 12/20/19 1702    Vanetta Mulders, MD 12/20/19 [phone]

## 2019-12-20 NOTE — Discharge Instructions (Addendum)
You have been seen today for a knee injury. There were no acute abnormalities on the x-rays, including no sign of fracture or dislocation, however, there could be injuries to the soft tissues, such as the ligaments or tendons that are not seen on xrays. There could also be what are called occult fractures that are small fractures not seen on xray. Antiinflammatory medications: Take 600 mg of ibuprofen every 6 hours or 440 mg (over the counter dose) to 500 mg (prescription dose) of naproxen every 12 hours for the next 3 days. After this time, these medications may be used as needed for pain. Take these medications with food to avoid upset stomach. Choose only one of these medications, do not take them together. Acetaminophen (generic for Tylenol): Should you continue to have additional pain while taking the ibuprofen or naproxen, you may add in acetaminophen as needed. Your daily total maximum amount of acetaminophen from all sources should be limited to 4000mg /day for persons without liver problems, or 2000mg /day for those with liver problems. Vicodin: May take Vicodin (hydrocodone-acetaminophen) as needed for severe pain.   Do not drive or perform other dangerous activities while taking this medication as it can cause drowsiness as well as changes in reaction time and judgement.   Please note that each pill of Vicodin contains 325 mg of acetaminophen (generic for Tylenol) and the above dosage limits apply. Ice: May apply ice to the area over the next 24 hours for 15 minutes at a time to reduce swelling. Elevation: Keep the extremity elevated as often as possible to reduce pain and inflammation. Support: Wear the knee immobilizer for support and comfort. Wear this until pain resolves. You will be weight-bearing as tolerated, which means you can slowly start to put weight on the extremity and increase amount and frequency as pain allows. Follow up: If symptoms are improving, you may follow up with your  primary care provider for any continued management. If symptoms are not starting to improve within a week, you should follow up with the orthopedic specialist within two weeks. Return: Return to the ED for numbness, weakness, increasing pain, overall worsening symptoms, loss of function, or if symptoms are not improving, you have tried to follow up with the orthopedic specialist, and have been unable to do so.  For prescription assistance, may try using prescription discount sites or apps, such as goodrx.com  Your blood pressure was higher than normal today.  Follow-up with a primary care provider on this matter.

## 2020-01-04 ENCOUNTER — Encounter: Payer: Self-pay | Admitting: Orthopaedic Surgery

## 2020-01-14 ENCOUNTER — Ambulatory Visit: Payer: Medicaid Other

## 2020-02-02 ENCOUNTER — Ambulatory Visit: Payer: Medicaid Other | Attending: Internal Medicine

## 2020-02-02 ENCOUNTER — Other Ambulatory Visit: Payer: Self-pay

## 2020-02-02 DIAGNOSIS — Z20822 Contact with and (suspected) exposure to covid-19: Secondary | ICD-10-CM

## 2020-02-03 LAB — NOVEL CORONAVIRUS, NAA: SARS-CoV-2, NAA: NOT DETECTED

## 2020-02-03 LAB — SARS-COV-2, NAA 2 DAY TAT

## 2020-02-04 ENCOUNTER — Telehealth: Payer: Self-pay | Admitting: Internal Medicine

## 2020-02-04 NOTE — Telephone Encounter (Signed)
Pt aware covid test results negative. 

## 2020-02-12 ENCOUNTER — Ambulatory Visit: Payer: Medicaid Other | Admitting: Orthopedic Surgery

## 2020-02-22 ENCOUNTER — Encounter: Payer: Self-pay | Admitting: Orthopedic Surgery

## 2020-02-22 ENCOUNTER — Other Ambulatory Visit: Payer: Self-pay

## 2020-02-22 ENCOUNTER — Ambulatory Visit: Payer: Medicaid Other | Admitting: Orthopedic Surgery

## 2020-02-22 VITALS — BP 131/89 | HR 89 | Ht 65.0 in | Wt 215.0 lb

## 2020-02-22 DIAGNOSIS — S83511A Sprain of anterior cruciate ligament of right knee, initial encounter: Secondary | ICD-10-CM

## 2020-02-22 DIAGNOSIS — M23611 Other spontaneous disruption of anterior cruciate ligament of right knee: Secondary | ICD-10-CM

## 2020-02-22 DIAGNOSIS — M238X2 Other internal derangements of left knee: Secondary | ICD-10-CM | POA: Diagnosis not present

## 2020-02-22 DIAGNOSIS — M25561 Pain in right knee: Secondary | ICD-10-CM

## 2020-02-22 DIAGNOSIS — G8929 Other chronic pain: Secondary | ICD-10-CM

## 2020-02-22 NOTE — Progress Notes (Signed)
NEW PROBLEM//OFFICE VISIT  Chief Complaint  Patient presents with  . Knee Pain    ER visit in March carrying dog food and fell / increased right knee pain since    29 year old male status post left ACL reconstruction when he was in high school.  This was done in [place].  He tore his right ACL back in 2014 but could not have surgery at that time because of insurance issues presents now with a new hyperextension injury to the right knee.  He was going down the steps his knee popped gave way and since that time he said instability episodes pain and swelling.     Review of Systems  All other systems reviewed and are negative.    Past Medical History:  Diagnosis Date  . ADHD (attention deficit hyperactivity disorder)   . GERD (gastroesophageal reflux disease)   . Hypertension   . Pancreas disorder 2013   patient reports he was told he might have pancreatitis. No elevated lipase or imaging to support per EPIC review    Past Surgical History:  Procedure Laterality Date  . ANTERIOR CRUCIATE LIGAMENT REPAIR     Left knee Dr. Garfield Cornea  . HERNIA REPAIR      Family History  Problem Relation Age of Onset  . ADD / ADHD Brother   . ADD / ADHD Maternal Aunt   . Drug abuse Maternal Uncle   . Alcohol abuse Maternal Uncle   . Schizophrenia Maternal Uncle   . Colon cancer Paternal Aunt        51 years old  . Anxiety disorder Neg Hx   . Bipolar disorder Neg Hx   . Dementia Neg Hx   . Depression Neg Hx   . OCD Neg Hx   . Paranoid behavior Neg Hx   . Seizures Neg Hx   . Sexual abuse Neg Hx   . Physical abuse Neg Hx   . Liver disease Neg Hx    Social History   Tobacco Use  . Smoking status: Current Some Day Smoker    Types: Cigars  . Smokeless tobacco: Never Used  Substance Use Topics  . Alcohol use: Yes    Comment: prior to 11/2018 was drinking heavy etoh daily, now doesn't drink every day (now pint brown liqour a day)  . Drug use: Not Currently    Types: Marijuana     Allergies  Allergen Reactions  . Bee Venom Swelling    Current Meds  Medication Sig  . hydrochlorothiazide (HYDRODIURIL) 25 MG tablet Take 25 mg by mouth daily.  Marland Kitchen lisinopril (PRINIVIL,ZESTRIL) 10 MG tablet Take 10 mg by mouth daily.  Marland Kitchen omeprazole (PRILOSEC) 20 MG capsule Take 20 mg by mouth daily.    BP 131/89   Pulse 89   Ht 5\' 5"  (1.651 m)   Wt 215 lb (97.5 kg)   BMI 35.78 kg/m   Physical Exam Constitutional:      General: He is not in acute distress.    Appearance: He is well-developed.  Cardiovascular:     Comments: No peripheral edema Musculoskeletal:     Right knee: Effusion present.     Instability Tests: Positive anterior drawer test. Negative posterior drawer test. Negative medial McMurray test and negative lateral McMurray test.     Left knee:     Instability Tests: Positive anterior drawer test. Negative posterior drawer test. Negative medial McMurray test and negative lateral McMurray test.  Skin:    General: Skin is warm and dry.  Neurological:     Mental Status: He is alert and oriented to person, place, and time.     Sensory: No sensory deficit.     Coordination: Coordination normal.     Gait: Gait abnormal.     Deep Tendon Reflexes: Reflexes are normal and symmetric.     Right Knee Exam   Muscle Strength  The patient has normal right knee strength.  Tenderness  The patient is experiencing tenderness in the medial joint line and lateral joint line.  Range of Motion  Extension: normal  Flexion: normal   Tests  McMurray:  Medial - negative Lateral - negative Varus: negative Valgus: negative Lachman:  Anterior - 2+     Drawer:  Anterior - positive    Posterior - negative Pivot shift: positive Patellar apprehension: negative  Other  Erythema: absent Scars: absent Sensation: normal Pulse: present Swelling: none Effusion: effusion present   Left Knee Exam   Muscle Strength  The patient has normal left knee  strength.  Tenderness  The patient is experiencing no tenderness.   Range of Motion  Extension: normal  Flexion: normal   Tests  McMurray:  Medial - negative Lateral - negative Varus: negative Valgus: negative Lachman:  Anterior - 1+     Drawer:  Anterior - positive     Posterior - negative  Other  Erythema: absent Scars: absent Sensation: normal Pulse: present Swelling: none        MEDICAL DECISION MAKING  A.  Encounter Diagnoses  Name Primary?  . Chronic pain of right knee Yes  . 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 ACL laxity, left     B. DATA ANALYSED:    IMAGING: Independent interpretation of images: X-ray was done at the hospital 4 views of the right knee  No fracture or dislocation growth plates are of course closed no evidence of avulsion fractures  Orders: MRI knee right  Outside records reviewed: Dr. Letitia Neri records indicate patient has controlled hypertension on 2 hypertensive medications  C. MANAGEMENT  He is going to need a new MRI.  Right knee.  Plan is to reconstruct the right knee and then in 6 months if he recovers well to reconstruct the left knee with a revision ACL   No orders of the defined types were placed in this encounter.  Encounter Diagnoses  Name Primary?  . Chronic pain of right knee Yes  . 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 ACL laxity, left        Fuller Canada, MD  02/22/2020 10:49 AM

## 2020-02-22 NOTE — Patient Instructions (Signed)
Mri rt knee

## 2020-03-28 ENCOUNTER — Ambulatory Visit (HOSPITAL_COMMUNITY): Payer: Medicaid Other

## 2020-04-19 ENCOUNTER — Other Ambulatory Visit: Payer: Self-pay

## 2020-04-19 ENCOUNTER — Ambulatory Visit (HOSPITAL_COMMUNITY)
Admission: RE | Admit: 2020-04-19 | Discharge: 2020-04-19 | Disposition: A | Discharge status: Home / Self Care | Payer: Medicaid Other | Source: Ambulatory Visit | Attending: Orthopedic Surgery | Admitting: Orthopedic Surgery

## 2020-04-19 DIAGNOSIS — G8929 Other chronic pain: Secondary | ICD-10-CM | POA: Diagnosis present

## 2020-04-19 DIAGNOSIS — M25561 Pain in right knee: Secondary | ICD-10-CM | POA: Diagnosis not present

## 2020-04-20 ENCOUNTER — Telehealth: Payer: Self-pay | Admitting: Orthopedic Surgery

## 2020-04-20 NOTE — Telephone Encounter (Signed)
IMPRESSION: 1. Complex tear of the body and posterior horn of the medial meniscus with a partial radial tear and peripheral meniscal extrusion. Complex tear of the anterior horn of the medial meniscus. 2. High-grade partial-thickness cartilage loss of the medial femorotibial compartment. 3. Complete chronic ACL tear.   Electronically Signed   By: Elige Ko   On: 04/20/2020 09:22  Message for patient to call back to discuss his results

## 2020-05-05 ENCOUNTER — Telehealth: Payer: Self-pay | Admitting: Orthopedic Surgery

## 2020-05-05 NOTE — Telephone Encounter (Signed)
Patient is asking for you to call him to over the MRI results and any further instructions  Thanks

## 2020-05-05 NOTE — Telephone Encounter (Signed)
He needs an appointment

## 2020-05-09 ENCOUNTER — Encounter: Payer: Self-pay | Admitting: Orthopedic Surgery

## 2020-05-09 ENCOUNTER — Other Ambulatory Visit: Payer: Self-pay

## 2020-05-09 ENCOUNTER — Ambulatory Visit (INDEPENDENT_AMBULATORY_CARE_PROVIDER_SITE_OTHER): Payer: Medicaid Other | Admitting: Orthopedic Surgery

## 2020-05-09 VITALS — BP 168/121 | HR 90 | Ht 65.0 in | Wt 215.2 lb

## 2020-05-09 DIAGNOSIS — S83511D Sprain of anterior cruciate ligament of right knee, subsequent encounter: Secondary | ICD-10-CM

## 2020-05-09 DIAGNOSIS — M1711 Unilateral primary osteoarthritis, right knee: Secondary | ICD-10-CM

## 2020-05-09 DIAGNOSIS — M1710 Unilateral primary osteoarthritis, unspecified knee: Secondary | ICD-10-CM

## 2020-05-09 DIAGNOSIS — M23321 Other meniscus derangements, posterior horn of medial meniscus, right knee: Secondary | ICD-10-CM

## 2020-05-09 NOTE — Progress Notes (Signed)
Joseph Blair  05/09/2020  Body mass index is 35.82 kg/m.   S:  Chief Complaint  Patient presents with  . Knee Pain    R/ hurting all the time/here to go over MRI   29 year old male presents for MRI follow-up has chronic right knee pain history of an old ACL tear that was never fixed MRI summary torn medial meniscus high-grade cartilage loss medial compartment torn ACL  O: BP (!) 168/121   Pulse 90   Ht 5\' 5"  (1.651 m)   Wt (!) 215 lb 4 oz (97.6 kg)   BMI 35.82 kg/m   Physical Exam  Bilateral ACL laxity left knee had surgery graft did not stabilize the knee right knee shows 2+ Lachman no effusion full range of motion no collateral ligament damage   MEDICAL DECISION MAKING  Encounter Diagnoses  Name Primary?  . Rupture of anterior cruciate ligament of right knee, subsequent encounter Yes  . Derangement of posterior horn of medial meniscus of right knee   . Primary localized osteoarthritis of knee       IMAGING: Independent interpretation of images: MRI reviewed and I see a torn ACL with torn medial meniscus arthritis medial compartment     MANAGEMENT   The procedure has been fully reviewed with the patient; The risks and benefits of surgery have been discussed and explained and understood. Alternative treatment has also been reviewed, questions were encouraged and answered. The postoperative plan is also been reviewed.   (CHR/INDEP XRAYS READ/SURGRY)  Allograft ACL reconstruction plus or minus meniscal repair versus meniscectomy right knee  1 week follow-up postop  No orders of the defined types were placed in this encounter.     , MD  05/09/2020 10:20 AM

## 2020-05-27 NOTE — Patient Instructions (Signed)
Your procedure is scheduled on: 06/02/2020  Report to [facility] at   6:15  AM.  Call this number if you have problems the morning of surgery: [phone]   Remember:   Do not Eat or Drink after midnight         No Smoking the morning of surgery  :  Take these medicines the morning of surgery with A SIP OF WATER: Omeprazole   Do not wear jewelry, make-up or nail polish.  Do not wear lotions, powders, or perfumes. You may wear deodorant.  Do not shave 48 hours prior to surgery. Men may shave face and neck.  Do not bring valuables to the hospital.  Contacts, dentures or bridgework may not be worn into surgery.  Leave suitcase in the car. After surgery it may be brought to your room.  For patients admitted to the hospital, checkout time is 11:00 AM the day of discharge.   Patients discharged the day of surgery will not be allowed to drive home.    Special Instructions: Shower using CHG night before surgery and shower the day of surgery use CHG.  Use special wash - you have one bottle of CHG for all showers.  You should use approximately 1/2 of the bottle for each shower.  How to Use Chlorhexidine for Bathing Chlorhexidine gluconate (CHG) is a germ-killing (antiseptic) solution that is used to clean the skin. It can get rid of the bacteria that normally live on the skin and can keep them away for about 24 hours. To clean your skin with CHG, you may be given:  A CHG solution to use in the shower or as part of a sponge bath.  A prepackaged cloth that contains CHG. Cleaning your skin with CHG may help lower the risk for infection:  While you are staying in the intensive care unit of the hospital.  If you have a vascular access, such as a central line, to provide short-term or long-term access to your veins.  If you have a catheter to drain urine from your bladder.  If you are on a ventilator. A ventilator is a machine that helps you breathe by moving air in and out of your  lungs.  After surgery. What are the risks? Risks of using CHG include:  A skin reaction.  Hearing loss, if CHG gets in your ears.  Eye injury, if CHG gets in your eyes and is not rinsed out.  The CHG product catching fire. Make sure that you avoid smoking and flames after applying CHG to your skin. Do not use CHG:  If you have a chlorhexidine allergy or have previously reacted to chlorhexidine.  On babies younger than 422 months of age. How to use CHG solution  Use CHG only as told by your health care provider, and follow the instructions on the label.  Use the full amount of CHG as directed. Usually, this is one bottle. During a shower Follow these steps when using CHG solution during a shower (unless your health care provider gives you different instructions): 1. Start the shower. 2. Use your normal soap and shampoo to wash your face and hair. 3. Turn off the shower or move out of the shower stream. 4. Pour the CHG onto a clean washcloth. Do not use any type of brush or rough-edged sponge. 5. Starting at your neck, lather your body down to your toes. Make sure you follow these instructions: ? If you will be having surgery, pay special attention  to the part of your body where you will be having surgery. Scrub this area for at least 1 minute. ? Do not use CHG on your head or face. If the solution gets into your ears or eyes, rinse them well with water. ? Avoid your genital area. ? Avoid any areas of skin that have broken skin, cuts, or scrapes. ? Scrub your back and under your arms. Make sure to wash skin folds. 6. Let the lather sit on your skin for 1-2 minutes or as long as told by your health care provider. 7. Thoroughly rinse your entire body in the shower. Make sure that all body creases and crevices are rinsed well. 8. Dry off with a clean towel. Do not put any substances on your body afterward--such as powder, lotion, or perfume--unless you are told to do so by your health  care provider. Only use lotions that are recommended by the manufacturer. 9. Put on clean clothes or pajamas. 10. If it is the night before your surgery, sleep in clean sheets.  During a sponge bath Follow these steps when using CHG solution during a sponge bath (unless your health care provider gives you different instructions): 1. Use your normal soap and shampoo to wash your face and hair. 2. Pour the CHG onto a clean washcloth. 3. Starting at your neck, lather your body down to your toes. Make sure you follow these instructions: ? If you will be having surgery, pay special attention to the part of your body where you will be having surgery. Scrub this area for at least 1 minute. ? Do not use CHG on your head or face. If the solution gets into your ears or eyes, rinse them well with water. ? Avoid your genital area. ? Avoid any areas of skin that have broken skin, cuts, or scrapes. ? Scrub your back and under your arms. Make sure to wash skin folds. 4. Let the lather sit on your skin for 1-2 minutes or as long as told by your health care provider. 5. Using a different clean, wet washcloth, thoroughly rinse your entire body. Make sure that all body creases and crevices are rinsed well. 6. Dry off with a clean towel. Do not put any substances on your body afterward--such as powder, lotion, or perfume--unless you are told to do so by your health care provider. Only use lotions that are recommended by the manufacturer. 7. Put on clean clothes or pajamas. 8. If it is the night before your surgery, sleep in clean sheets. How to use CHG prepackaged cloths  Only use CHG cloths as told by your health care provider, and follow the instructions on the label.  Use the CHG cloth on clean, dry skin.  Do not use the CHG cloth on your head or face unless your health care provider tells you to.  When washing with the CHG cloth: ? Avoid your genital area. ? Avoid any areas of skin that have broken  skin, cuts, or scrapes. Before surgery Follow these steps when using a CHG cloth to clean before surgery (unless your health care provider gives you different instructions): 1. Using the CHG cloth, vigorously scrub the part of your body where you will be having surgery. Scrub using a back-and-forth motion for 3 minutes. The area on your body should be completely wet with CHG when you are done scrubbing. 2. Do not rinse. Discard the cloth and let the area air-dry. Do not put any substances on the area afterward,  such as powder, lotion, or perfume. 3. Put on clean clothes or pajamas. 4. If it is the night before your surgery, sleep in clean sheets.  For general bathing Follow these steps when using CHG cloths for general bathing (unless your health care provider gives you different instructions). 1. Use a separate CHG cloth for each area of your body. Make sure you wash between any folds of skin and between your fingers and toes. Wash your body in the following order, switching to a new cloth after each step: ? The front of your neck, shoulders, and chest. ? Both of your arms, under your arms, and your hands. ? Your stomach and groin area, avoiding the genitals. ? Your right leg and foot. ? Your left leg and foot. ? The back of your neck, your back, and your buttocks. 2. Do not rinse. Discard the cloth and let the area air-dry. Do not put any substances on your body afterward--such as powder, lotion, or perfume--unless you are told to do so by your health care provider. Only use lotions that are recommended by the manufacturer. 3. Put on clean clothes or pajamas. Contact a health care provider if:  Your skin gets irritated after scrubbing.  You have questions about using your solution or cloth. Get help right away if:  Your eyes become very red or swollen.  Your eyes itch badly.  Your skin itches badly and is red or swollen.  Your hearing changes.  You have trouble seeing.  You have  swelling or tingling in your mouth or throat.  You have trouble breathing.  You swallow any chlorhexidine. Summary  Chlorhexidine gluconate (CHG) is a germ-killing (antiseptic) solution that is used to clean the skin. Cleaning your skin with CHG may help to lower your risk for infection.  You may be given CHG to use for bathing. It may be in a bottle or in a prepackaged cloth to use on your skin. Carefully follow your health care provider's instructions and the instructions on the product label.  Do not use CHG if you have a chlorhexidine allergy.  Contact your health care provider if your skin gets irritated after scrubbing. This information is not intended to replace advice given to you by your health care provider. Make sure you discuss any questions you have with your health care provider. Document Revised: 12/18/2018 Document Reviewed: 08/29/2017 Elsevier Patient Education  2020 [employer]  Anterior Cruciate Ligament Reconstruction, Care After This sheet gives you information about how to care for yourself after your procedure. Your health care provider may also give you more specific instructions. If you have problems or questions, contact your health care provider. What can I expect after the procedure? After the procedure, it is common to have soreness in the area where the surgical incisions were made. Follow these instructions at home: If you have a splint or brace:  Wear the splint or brace as told by your health care provider. Remove it only as told by your health care provider.  Loosen the splint or brace if your toes tingle, become numb, or turn cold and blue.  Keep the splint or brace clean.  If the splint or brace is not waterproof: ? Do not let it get wet. ? Cover it with a watertight covering when you take a bath or shower. Bathing  Do not take baths, swim, or use a hot tub until your health care provider approves. Ask your health care provider if you may  take showers.  You may only be allowed to take sponge baths.  Keep the bandage (dressing) dry until your health care provider says it can be removed. Incision care   Follow instructions from your health care provider about how to take care of your incisions. Make sure you: ? Wash your hands with soap and water before and after you change your dressing. If soap and water are not available, use hand sanitizer. ? Change your dressing as told by your health care provider. ? Leave stitches (sutures), skin glue, or adhesive strips in place. These skin closures may need to stay in place for 2 weeks or longer. If adhesive strip edges start to loosen and curl up, you may trim the loose edges. Do not remove adhesive strips completely unless your health care provider tells you to do that.  Check your incision areas every day for signs of infection. Check for: ? Redness, swelling, or pain. ? Fluid or blood. ? Warmth. ? Pus or a bad smell. Managing pain, stiffness, and swelling   If directed, put ice on the affected area. ? If you have a removable splint or brace, remove it as told by your health care provider. ? Put ice in a plastic bag. ? Place a towel between your skin and the bag. ? Leave the ice on for 20 minutes, 2-3 times a day.  Move your toes often to reduce stiffness and swelling.  Raise (elevate) the affected area above the level of your heart while you are sitting or lying down. Medicines  Take over-the-counter and prescription medicines only as told by your health care provider.  Ask your health care provider if the medicine prescribed to you: ? Requires you to avoid driving or using heavy machinery. ? Can cause constipation. You may need to take actions to prevent or treat constipation, such as:  Drink enough fluid to keep your urine pale yellow.  Take over-the-counter or prescription medicines.  Eat foods that are high in fiber, such as beans, whole grains, and fresh fruits  and vegetables.  Limit foods that are high in fat and processed sugars, such as fried or sweet foods. Driving  Do not drive for 24 hours if you were given a sedative during your procedure.  Ask your health care provider when it is safe to drive if you have a splint or brace on your leg. Activity  Return to your normal activities as told by your health care provider. Ask your health care provider what activities are safe for you.  If physical therapy was prescribed, do exercises only as directed. Doing exercises may help improve knee movement and flexibility (range of motion). General instructions  Do not use the injured leg to support your body weight until your health care provider says that you can. Use crutches as told by your health care provider.  Do not use any products that contain nicotine or tobacco, such as cigarettes, e-cigarettes, and chewing tobacco. These can delay incision or bone healing. If you need help quitting, ask your health care provider.  Keep all follow-up visits as told by your health care provider. This is important. Contact a health care provider if:  You have a fever.  You have redness, swelling, or pain around an incision.  You have fluid or blood coming from an incision.  Your incision area feels warm to the touch.  You have pus or a bad smell coming from an incision.  Any of your incisions break open after sutures or staples have  been removed. Get help right away if:  You have severe pain that is not relieved with medicine. Summary  After the procedure, it is common to have soreness in the area where the surgical incisions were made.  Take over-the-counter and prescription medicines only as told by your health care provider.  Wear a splint or brace as told by your health care provider. Remove it only as told by your health care provider.  Do not use the injured leg to support your body weight until your health care provider says that you  can. Use crutches as told by your health care provider.  Contact a health care provider if you have any signs of infection in the incision area. This information is not intended to replace advice given to you by your health care provider. Make sure you discuss any questions you have with your health care provider. Document Revised: 01/20/2019 Document Reviewed: 06/26/2018 Elsevier Patient Education  2020 [employer]  General Anesthesia, Adult, Care After This sheet gives you information about how to care for yourself after your procedure. Your health care provider may also give you more specific instructions. If you have problems or questions, contact your health care provider. What can I expect after the procedure? After the procedure, the following side effects are common:  Pain or discomfort at the IV site.  Nausea.  Vomiting.  Sore throat.  Trouble concentrating.  Feeling cold or chills.  Weak or tired.  Sleepiness and fatigue.  Soreness and body aches. These side effects can affect parts of the body that were not involved in surgery. Follow these instructions at home:  For at least 24 hours after the procedure:  Have a responsible adult stay with you. It is important to have someone help care for you until you are awake and alert.  Rest as needed.  Do not: ? Participate in activities in which you could fall or become injured. ? Drive. ? Use heavy machinery. ? Drink alcohol. ? Take sleeping pills or medicines that cause drowsiness. ? Make important decisions or sign legal documents. ? Take care of children on your own. Eating and drinking  Follow any instructions from your health care provider about eating or drinking restrictions.  When you feel hungry, start by eating small amounts of foods that are soft and easy to digest (bland), such as toast. Gradually return to your regular diet.  Drink enough fluid to keep your urine pale yellow.  If you vomit,  rehydrate by drinking water, juice, or clear broth. General instructions  If you have sleep apnea, surgery and certain medicines can increase your risk for breathing problems. Follow instructions from your health care provider about wearing your sleep device: ? Anytime you are sleeping, including during daytime naps. ? While taking prescription pain medicines, sleeping medicines, or medicines that make you drowsy.  Return to your normal activities as told by your health care provider. Ask your health care provider what activities are safe for you.  Take over-the-counter and prescription medicines only as told by your health care provider.  If you smoke, do not smoke without supervision.  Keep all follow-up visits as told by your health care provider. This is important. Contact a health care provider if:  You have nausea or vomiting that does not get better with medicine.  You cannot eat or drink without vomiting.  You have pain that does not get better with medicine.  You are unable to pass urine.  You develop a skin  rash.  You have a fever.  You have redness around your IV site that gets worse. Get help right away if:  You have difficulty breathing.  You have chest pain.  You have blood in your urine or stool, or you vomit blood. Summary  After the procedure, it is common to have a sore throat or nausea. It is also common to feel tired.  Have a responsible adult stay with you for the first 24 hours after general anesthesia. It is important to have someone help care for you until you are awake and alert.  When you feel hungry, start by eating small amounts of foods that are soft and easy to digest (bland), such as toast. Gradually return to your regular diet.  Drink enough fluid to keep your urine pale yellow.  Return to your normal activities as told by your health care provider. Ask your health care provider what activities are safe for you. This information is not  intended to replace advice given to you by your health care provider. Make sure you discuss any questions you have with your health care provider. Document Revised: 10/04/2017 Document Reviewed: 05/17/2017 Elsevier Patient Education  2020 [employer].

## 2020-05-31 ENCOUNTER — Encounter (HOSPITAL_COMMUNITY)
Admission: RE | Admit: 2020-05-31 | Discharge: 2020-05-31 | Disposition: A | Discharge status: Home / Self Care | Payer: Medicaid Other | Source: Ambulatory Visit | Attending: Orthopedic Surgery | Admitting: Orthopedic Surgery

## 2020-05-31 ENCOUNTER — Encounter (HOSPITAL_COMMUNITY): Payer: Self-pay

## 2020-05-31 ENCOUNTER — Other Ambulatory Visit: Payer: Self-pay

## 2020-05-31 ENCOUNTER — Other Ambulatory Visit (HOSPITAL_COMMUNITY)
Admission: RE | Admit: 2020-05-31 | Discharge: 2020-05-31 | Disposition: A | Discharge status: Home / Self Care | Payer: Medicaid Other | Source: Ambulatory Visit | Attending: Orthopedic Surgery | Admitting: Orthopedic Surgery

## 2020-05-31 DIAGNOSIS — Z01812 Encounter for preprocedural laboratory examination: Secondary | ICD-10-CM | POA: Diagnosis not present

## 2020-05-31 DIAGNOSIS — Z20822 Contact with and (suspected) exposure to covid-19: Secondary | ICD-10-CM | POA: Diagnosis not present

## 2020-05-31 LAB — CBC WITH DIFFERENTIAL/PLATELET
Abs Immature Granulocytes: 0.02 10*3/uL (ref 0.00–0.07)
Basophils Absolute: 0 10*3/uL (ref 0.0–0.1)
Basophils Relative: 1 %
Eosinophils Absolute: 0.1 10*3/uL (ref 0.0–0.5)
Eosinophils Relative: 2 %
HCT: 41.7 % (ref 39.0–52.0)
Hemoglobin: 14.2 g/dL (ref 13.0–17.0)
Immature Granulocytes: 0 %
Lymphocytes Relative: 36 %
Lymphs Abs: 2.4 10*3/uL (ref 0.7–4.0)
MCH: 32.8 pg (ref 26.0–34.0)
MCHC: 34.1 g/dL (ref 30.0–36.0)
MCV: 96.3 fL (ref 80.0–100.0)
Monocytes Absolute: 0.6 10*3/uL (ref 0.1–1.0)
Monocytes Relative: 9 %
Neutro Abs: 3.5 10*3/uL (ref 1.7–7.7)
Neutrophils Relative %: 52 %
Platelets: 194 10*3/uL (ref 150–400)
RBC: 4.33 MIL/uL (ref 4.22–5.81)
RDW: 13.1 % (ref 11.5–15.5)
WBC: 6.6 10*3/uL (ref 4.0–10.5)
nRBC: 0 % (ref 0.0–0.2)

## 2020-05-31 LAB — BASIC METABOLIC PANEL
Anion gap: 11 (ref 5–15)
BUN: 12 mg/dL (ref 6–20)
CO2: 23 mmol/L (ref 22–32)
Calcium: 9.3 mg/dL (ref 8.9–10.3)
Chloride: 105 mmol/L (ref 98–111)
Creatinine, Ser: 0.8 mg/dL (ref 0.61–1.24)
GFR calc Af Amer: >60 mL/min (ref >60)
GFR calc non Af Amer: >60 mL/min (ref >60)
Glucose, Bld: 97 mg/dL (ref 70–99)
Potassium: 4.3 mmol/L (ref 3.5–5.1)
Sodium: 139 mmol/L (ref 135–145)

## 2020-06-01 LAB — SARS CORONAVIRUS 2 (TAT 6-24 HRS): SARS Coronavirus 2: NEGATIVE

## 2020-06-01 NOTE — H&P (Signed)
Joseph Blair   05/09/2020   Body mass index is 35.82 kg/m.    S:       Chief Complaint  Patient presents with  . Knee Pain      R/ hurting all the time/here to go over MRI    29 year old male presents for MRI follow-up has chronic right knee pain history of an old ACL tear that was never fixed, MRI shows summary torn medial meniscus high-grade cartilage loss medial compartment torn ACL  Review of systems no other complaints  Past Medical History:  Diagnosis Date  . ADHD (attention deficit hyperactivity disorder)   . GERD (gastroesophageal reflux disease)   . Hypertension   . Pancreas disorder 2013   patient reports he was told he might have pancreatitis. No elevated lipase or imaging to support per EPIC review   Past Surgical History:  Procedure Laterality Date  . ANTERIOR CRUCIATE LIGAMENT REPAIR     Left knee Dr. Edger House  . HERNIA REPAIR     Social History   Tobacco Use  . Smoking status: Current Some Day Smoker    Types: Cigars  . Smokeless tobacco: Never Used  Substance Use Topics  . Alcohol use: Yes    Comment: prior to 11/2018 was drinking heavy etoh daily, now doesn't drink every day (now pint brown liqour a day)  . Drug use: Not Currently    Types: Marijuana   Family History  Problem Relation Age of Onset  . ADD / ADHD Brother   . ADD / ADHD Maternal Aunt   . Drug abuse Maternal Uncle   . Alcohol abuse Maternal Uncle   . Schizophrenia Maternal Uncle   . Colon cancer Paternal Aunt        71 years old  . Anxiety disorder Neg Hx   . Bipolar disorder Neg Hx   . Dementia Neg Hx   . Depression Neg Hx   . OCD Neg Hx   . Paranoid behavior Neg Hx   . Seizures Neg Hx   . Sexual abuse Neg Hx   . Physical abuse Neg Hx   . Liver disease Neg Hx        O: BP (!) 168/121   Pulse 90   Ht 5\' 5"  (1.651 m)   Wt (!) 215 lb 4 oz (97.6 kg)   BMI 35.82 kg/m    Physical Exam  This is a healthy-appearing 29 year old male normal body habitus he is  awake alert and oriented x3 mood and affect are normal he has no gait abnormalities  Left knee shows significant laxity right knee shows a 2+ Lachman no effusion full range of motion no collateral ligament damage otherwise neurovascular exam is intact        MEDICAL DECISION MAKING       Encounter Diagnoses  Name Primary?  . Rupture of anterior cruciate ligament of right knee, subsequent encounter Yes  . Derangement of posterior horn of medial meniscus of right knee    . Primary localized osteoarthritis of knee          IMAGING: Independent interpretation of images: MRI reviewed and I see a torn ACL with torn medial meniscus arthritis medial compartment     MANAGEMENT    The procedure has been fully reviewed with the patient; The risks and benefits of surgery have been discussed and explained and understood. Alternative treatment has also been reviewed, questions were encouraged and answered. The postoperative plan is also been reviewed.     (  CHR/INDEP XRAYS READ/SURGRY)   Allograft ACL reconstruction plus or minus meniscal repair versus meniscectomy right knee   1 week follow-up postop   No orders of the defined types were placed in this encounter.         Fuller Canada, MD   05/09/2020 10:20 AM

## 2020-06-02 ENCOUNTER — Ambulatory Visit (HOSPITAL_COMMUNITY)
Admission: RE | Admit: 2020-06-02 | Discharge: 2020-06-02 | Disposition: A | Discharge status: Home / Self Care | Payer: Medicaid Other | Attending: Orthopedic Surgery | Admitting: Orthopedic Surgery

## 2020-06-02 ENCOUNTER — Encounter (HOSPITAL_COMMUNITY): Payer: Self-pay | Admitting: Orthopedic Surgery

## 2020-06-02 ENCOUNTER — Ambulatory Visit (HOSPITAL_COMMUNITY): Payer: Medicaid Other | Admitting: Anesthesiology

## 2020-06-02 ENCOUNTER — Encounter (HOSPITAL_COMMUNITY)
Admission: RE | Disposition: A | Discharge status: Home / Self Care | Payer: Self-pay | Source: Home / Self Care | Attending: Orthopedic Surgery

## 2020-06-02 DIAGNOSIS — Z818 Family history of other mental and behavioral disorders: Secondary | ICD-10-CM | POA: Diagnosis not present

## 2020-06-02 DIAGNOSIS — M23251 Derangement of posterior horn of lateral meniscus due to old tear or injury, right knee: Secondary | ICD-10-CM | POA: Diagnosis not present

## 2020-06-02 DIAGNOSIS — M23321 Other meniscus derangements, posterior horn of medial meniscus, right knee: Secondary | ICD-10-CM

## 2020-06-02 DIAGNOSIS — Z8489 Family history of other specified conditions: Secondary | ICD-10-CM | POA: Insufficient documentation

## 2020-06-02 DIAGNOSIS — Y939 Activity, unspecified: Secondary | ICD-10-CM | POA: Insufficient documentation

## 2020-06-02 DIAGNOSIS — Z800 Family history of malignant neoplasm of digestive organs: Secondary | ICD-10-CM | POA: Diagnosis not present

## 2020-06-02 DIAGNOSIS — S83511A Sprain of anterior cruciate ligament of right knee, initial encounter: Secondary | ICD-10-CM | POA: Diagnosis present

## 2020-06-02 DIAGNOSIS — I10 Essential (primary) hypertension: Secondary | ICD-10-CM | POA: Diagnosis not present

## 2020-06-02 DIAGNOSIS — M2351 Chronic instability of knee, right knee: Secondary | ICD-10-CM

## 2020-06-02 DIAGNOSIS — F17290 Nicotine dependence, other tobacco product, uncomplicated: Secondary | ICD-10-CM | POA: Diagnosis not present

## 2020-06-02 DIAGNOSIS — M23221 Derangement of posterior horn of medial meniscus due to old tear or injury, right knee: Secondary | ICD-10-CM | POA: Insufficient documentation

## 2020-06-02 DIAGNOSIS — X58XXXA Exposure to other specified factors, initial encounter: Secondary | ICD-10-CM | POA: Insufficient documentation

## 2020-06-02 DIAGNOSIS — Z811 Family history of alcohol abuse and dependence: Secondary | ICD-10-CM | POA: Insufficient documentation

## 2020-06-02 DIAGNOSIS — K219 Gastro-esophageal reflux disease without esophagitis: Secondary | ICD-10-CM | POA: Insufficient documentation

## 2020-06-02 DIAGNOSIS — F909 Attention-deficit hyperactivity disorder, unspecified type: Secondary | ICD-10-CM | POA: Diagnosis not present

## 2020-06-02 HISTORY — PX: KNEE ARTHROSCOPY WITH MEDIAL MENISECTOMY: SHX5651

## 2020-06-02 HISTORY — PX: ANTERIOR CRUCIATE LIGAMENT REPAIR: SHX115

## 2020-06-02 SURGERY — REPAIR, KNEE, ACL
Anesthesia: General | Site: Knee | Laterality: Right

## 2020-06-02 MED ORDER — ONDANSETRON HCL 4 MG/2ML IJ SOLN
INTRAMUSCULAR | Status: AC
  Filled 2020-06-02: qty 2

## 2020-06-02 MED ORDER — SODIUM CHLORIDE 0.9 % IR SOLN
Status: DC | PRN
  Administered 2020-06-02 (×5): 3000 mL

## 2020-06-02 MED ORDER — SODIUM CHLORIDE FLUSH 0.9 % IV SOLN
INTRAVENOUS | Status: AC
  Filled 2020-06-02: qty 10

## 2020-06-02 MED ORDER — LIDOCAINE HCL (CARDIAC) PF 100 MG/5ML IV SOSY
PREFILLED_SYRINGE | INTRAVENOUS | Status: DC | PRN
  Administered 2020-06-02: 30 mg via INTRATRACHEAL

## 2020-06-02 MED ORDER — BUPIVACAINE-EPINEPHRINE (PF) 0.5% -1:200000 IJ SOLN
INTRAMUSCULAR | Status: DC | PRN
  Administered 2020-06-02: 28 mL via PERINEURAL

## 2020-06-02 MED ORDER — PROPOFOL 10 MG/ML IV BOLUS
INTRAVENOUS | Status: AC
  Filled 2020-06-02: qty 20

## 2020-06-02 MED ORDER — ORAL CARE MOUTH RINSE: 15.0000 mL | Freq: Once | OROMUCOSAL | Status: AC

## 2020-06-02 MED ORDER — MIDAZOLAM HCL 2 MG/2ML IJ SOLN
2.0000 mg | Freq: Once | INTRAMUSCULAR | Status: DC
  Filled 2020-06-02: qty 2

## 2020-06-02 MED ORDER — MIDAZOLAM HCL 5 MG/5ML IJ SOLN
INTRAMUSCULAR | Status: DC | PRN
  Administered 2020-06-02: 2 mg via INTRAVENOUS

## 2020-06-02 MED ORDER — MIDAZOLAM HCL 2 MG/2ML IJ SOLN
INTRAMUSCULAR | Status: AC
  Filled 2020-06-02: qty 2

## 2020-06-02 MED ORDER — BUPIVACAINE-EPINEPHRINE (PF) 0.5% -1:200000 IJ SOLN
INTRAMUSCULAR | Status: DC | PRN
  Administered 2020-06-02: 10 mL

## 2020-06-02 MED ORDER — IBUPROFEN 800 MG PO TABS: 800.0000 mg | ORAL_TABLET | Freq: Three times a day (TID) | ORAL | 1 refills | Status: DC | PRN

## 2020-06-02 MED ORDER — HYDROCODONE-ACETAMINOPHEN 10-325 MG PO TABS
1.0000 | ORAL_TABLET | ORAL | 0 refills | Status: DC | PRN
Start: 2020-06-02 — End: 2020-06-10

## 2020-06-02 MED ORDER — HYDROMORPHONE HCL 1 MG/ML IJ SOLN
0.2500 mg | INTRAMUSCULAR | Status: DC | PRN
  Administered 2020-06-02: 0.5 mg via INTRAVENOUS
  Filled 2020-06-02: qty 0.5

## 2020-06-02 MED ORDER — DEXAMETHASONE SODIUM PHOSPHATE 10 MG/ML IJ SOLN
INTRAMUSCULAR | Status: AC
  Filled 2020-06-02: qty 1

## 2020-06-02 MED ORDER — DEXAMETHASONE SODIUM PHOSPHATE 4 MG/ML IJ SOLN
INTRAMUSCULAR | Status: DC | PRN
  Administered 2020-06-02: 8 mg via INTRAVENOUS

## 2020-06-02 MED ORDER — FENTANYL CITRATE (PF) 250 MCG/5ML IJ SOLN
INTRAMUSCULAR | Status: AC
  Filled 2020-06-02: qty 5

## 2020-06-02 MED ORDER — DEXAMETHASONE SODIUM PHOSPHATE 4 MG/ML IJ SOLN
INTRAMUSCULAR | Status: AC
  Filled 2020-06-02: qty 2

## 2020-06-02 MED ORDER — BUPIVACAINE-EPINEPHRINE (PF) 0.5% -1:200000 IJ SOLN
INTRAMUSCULAR | Status: AC
  Filled 2020-06-02: qty 60

## 2020-06-02 MED ORDER — CEFAZOLIN SODIUM-DEXTROSE 2-4 GM/100ML-% IV SOLN
2.0000 g | INTRAVENOUS | Status: AC
  Administered 2020-06-02: 2 g via INTRAVENOUS
  Filled 2020-06-02: qty 100

## 2020-06-02 MED ORDER — FENTANYL CITRATE (PF) 250 MCG/5ML IJ SOLN
INTRAMUSCULAR | Status: DC | PRN
  Administered 2020-06-02 (×3): 50 ug via INTRAVENOUS
  Administered 2020-06-02 (×2): 25 ug via INTRAVENOUS

## 2020-06-02 MED ORDER — PROMETHAZINE HCL 25 MG/ML IJ SOLN: 6.2500 mg | INTRAMUSCULAR | Status: DC | PRN

## 2020-06-02 MED ORDER — CHLORHEXIDINE GLUCONATE 0.12 % MT SOLN
15.0000 mL | Freq: Once | OROMUCOSAL | Status: AC
  Administered 2020-06-02: 15 mL via OROMUCOSAL

## 2020-06-02 MED ORDER — LACTATED RINGERS IV SOLN: INTRAVENOUS | Status: DC | PRN

## 2020-06-02 MED ORDER — SODIUM CHLORIDE 0.9 % IR SOLN
Status: DC | PRN
  Administered 2020-06-02 (×2): 1000 mL

## 2020-06-02 MED ORDER — BUPIVACAINE-EPINEPHRINE (PF) 0.5% -1:200000 IJ SOLN
INTRAMUSCULAR | Status: AC
  Filled 2020-06-02: qty 30

## 2020-06-02 MED ORDER — MEPERIDINE HCL 50 MG/ML IJ SOLN: 6.2500 mg | INTRAMUSCULAR | Status: DC | PRN

## 2020-06-02 MED ORDER — BUPIVACAINE-EPINEPHRINE (PF) 0.25% -1:200000 IJ SOLN
INTRAMUSCULAR | Status: AC
  Filled 2020-06-02: qty 30

## 2020-06-02 MED ORDER — LIDOCAINE HCL (PF) 1 % IJ SOLN
INTRAMUSCULAR | Status: AC
  Filled 2020-06-02: qty 30

## 2020-06-02 MED ORDER — PROMETHAZINE HCL 12.5 MG PO TABS: 12.5000 mg | ORAL_TABLET | Freq: Four times a day (QID) | ORAL | 0 refills | Status: DC | PRN

## 2020-06-02 MED ORDER — PROPOFOL 10 MG/ML IV BOLUS
INTRAVENOUS | Status: DC | PRN
  Administered 2020-06-02: 50 mg via INTRAVENOUS
  Administered 2020-06-02: 150 mg via INTRAVENOUS

## 2020-06-02 MED ORDER — BUPIVACAINE-EPINEPHRINE (PF) 0.25% -1:200000 IJ SOLN
INTRAMUSCULAR | Status: DC | PRN
  Administered 2020-06-02: 19 mL via PERINEURAL

## 2020-06-02 MED ORDER — FENTANYL CITRATE (PF) 100 MCG/2ML IJ SOLN
100.0000 ug | Freq: Once | INTRAMUSCULAR | Status: DC
  Filled 2020-06-02: qty 2

## 2020-06-02 MED ORDER — LIDOCAINE 2% (20 MG/ML) 5 ML SYRINGE
INTRAMUSCULAR | Status: AC
  Filled 2020-06-02: qty 5

## 2020-06-02 MED ORDER — KETOROLAC TROMETHAMINE 30 MG/ML IJ SOLN
30.0000 mg | Freq: Once | INTRAMUSCULAR | Status: AC
  Administered 2020-06-02: 30 mg via INTRAVENOUS
  Filled 2020-06-02: qty 1

## 2020-06-02 MED ORDER — LACTATED RINGERS IV SOLN: Freq: Once | INTRAVENOUS | Status: AC

## 2020-06-02 MED ORDER — LIDOCAINE HCL (PF) 1 % IJ SOLN
INTRAMUSCULAR | Status: DC | PRN
  Administered 2020-06-02: 3 mL

## 2020-06-02 MED ORDER — EPINEPHRINE PF 1 MG/ML IJ SOLN
INTRAMUSCULAR | Status: AC
  Filled 2020-06-02: qty 10

## 2020-06-02 MED ORDER — HYDROCODONE-ACETAMINOPHEN 7.5-325 MG PO TABS
1.0000 | ORAL_TABLET | Freq: Once | ORAL | Status: AC
  Administered 2020-06-02: 1 via ORAL
  Filled 2020-06-02: qty 1

## 2020-06-02 MED ORDER — ONDANSETRON HCL 4 MG/2ML IJ SOLN
INTRAMUSCULAR | Status: DC | PRN
  Administered 2020-06-02: 4 mg via INTRAVENOUS

## 2020-06-02 MED ORDER — ONDANSETRON HCL 4 MG/2ML IJ SOLN
4.0000 mg | Freq: Once | INTRAMUSCULAR | Status: AC
  Administered 2020-06-02: 4 mg via INTRAVENOUS
  Filled 2020-06-02: qty 2

## 2020-06-02 MED ORDER — DEXAMETHASONE SODIUM PHOSPHATE 4 MG/ML IJ SOLN
INTRAMUSCULAR | Status: DC | PRN
  Administered 2020-06-02: 10 mg via INTRAVENOUS

## 2020-06-02 SURGICAL SUPPLY — 77 items
ABLATOR ASPIRATE 50D MULTI-PRT (SURGICAL WAND) ×3 IMPLANT
APL PRP STRL LF DISP 70% ISPRP (MISCELLANEOUS) ×2
BIT DRILL 2.4X128 (BIT) ×2 IMPLANT
BIT DRILL 2.4X128MM (BIT) ×1
BLADE EXCALIBUR 4.0MM X 13CM (MISCELLANEOUS) ×1
BLADE EXCALIBUR 4.0X13 (MISCELLANEOUS) ×2 IMPLANT
BLADE OSC/SAGITTAL MD 9X18.5 (BLADE) ×3 IMPLANT
BLADE SURG 15 STRL LF DISP TIS (BLADE) ×1 IMPLANT
BLADE SURG 15 STRL SS (BLADE) ×3
BLADE SURG SZ10 CARB STEEL (BLADE) ×6 IMPLANT
BLADE SURG SZ11 CARB STEEL (BLADE) ×3 IMPLANT
BNDG CMPR STD VLCR NS LF 5.8X6 (GAUZE/BANDAGES/DRESSINGS) ×1
BNDG ELASTIC 6X5.8 VLCR NS LF (GAUZE/BANDAGES/DRESSINGS) ×3 IMPLANT
BRACE T-SCOPE KNEE POSTOP (MISCELLANEOUS) ×3 IMPLANT
BURR OVAL 8 FLU 4.0MM X 13CM (MISCELLANEOUS) ×1
BURR OVAL 8 FLU 4.0X13 (MISCELLANEOUS) ×2 IMPLANT
CHLORAPREP W/TINT 26 (MISCELLANEOUS) ×6 IMPLANT
CLOTH BEACON ORANGE TIMEOUT ST (SAFETY) ×3 IMPLANT
COOLER ICEMAN CLASSIC (MISCELLANEOUS) ×3 IMPLANT
COVER LIGHT HANDLE STERIS (MISCELLANEOUS) ×6 IMPLANT
COVER WAND RF STERILE (DRAPES) ×3 IMPLANT
CUFF TOURN SGL QUICK 34 (TOURNIQUET CUFF) ×3
CUFF TRNQT CYL 34X4.125X (TOURNIQUET CUFF) ×1 IMPLANT
DECANTER SPIKE VIAL GLASS SM (MISCELLANEOUS) ×6 IMPLANT
ELECT REM PT RETURN 9FT ADLT (ELECTROSURGICAL) ×3
ELECTRODE REM PT RTRN 9FT ADLT (ELECTROSURGICAL) ×1 IMPLANT
GAUZE 4X4 16PLY RFD (DISPOSABLE) ×3 IMPLANT
GAUZE SPONGE 4X4 12PLY STRL (GAUZE/BANDAGES/DRESSINGS) ×3 IMPLANT
GAUZE XEROFORM 5X9 LF (GAUZE/BANDAGES/DRESSINGS) ×3 IMPLANT
GLOVE BIOGEL M 7.0 STRL (GLOVE) ×6 IMPLANT
GLOVE BIOGEL PI IND STRL 7.0 (GLOVE) ×3 IMPLANT
GLOVE BIOGEL PI INDICATOR 7.0 (GLOVE) ×6
GLOVE SKINSENSE NS SZ8.0 LF (GLOVE) ×2
GLOVE SKINSENSE STRL SZ8.0 LF (GLOVE) ×1 IMPLANT
GLOVE SS N UNI LF 8.5 STRL (GLOVE) ×3 IMPLANT
GLOVE SURG SS PI 7.5 STRL IVOR (GLOVE) ×3 IMPLANT
GOWN STRL REUS W/TWL LRG LVL3 (GOWN DISPOSABLE) ×6 IMPLANT
GOWN STRL REUS W/TWL XL LVL3 (GOWN DISPOSABLE) ×6 IMPLANT
GRAFT ACHILLES TENDON (Bone Implant) ×3 IMPLANT
INST SET MINOR BONE (KITS) ×3 IMPLANT
IV NS IRRIG 3000ML ARTHROMATIC (IV SOLUTION) ×15 IMPLANT
KIT BLADEGUARD II DBL (SET/KITS/TRAYS/PACK) ×3 IMPLANT
KIT TRANSTIBIAL (DISPOSABLE) ×3 IMPLANT
KIT TURNOVER CYSTO (KITS) ×3 IMPLANT
MANIFOLD NEPTUNE II (INSTRUMENTS) ×3 IMPLANT
MARKER SKIN DUAL TIP RULER LAB (MISCELLANEOUS) ×3 IMPLANT
NEEDLE HYPO 21X1.5 SAFETY (NEEDLE) ×3 IMPLANT
NEEDLE SPNL 18GX3.5 QUINCKE PK (NEEDLE) ×3 IMPLANT
NS IRRIG 1000ML POUR BTL (IV SOLUTION) ×6 IMPLANT
PACK ARTHRO LIMB DRAPE STRL (MISCELLANEOUS) ×3 IMPLANT
PACK BASIC III (CUSTOM PROCEDURE TRAY) ×3
PACK SRG BSC III STRL LF ECLPS (CUSTOM PROCEDURE TRAY) ×1 IMPLANT
PAD ABD 5X9 TENDERSORB (GAUZE/BANDAGES/DRESSINGS) ×3 IMPLANT
PAD ARMBOARD 7.5X6 YLW CONV (MISCELLANEOUS) ×3 IMPLANT
PAD COLD SHLDR WRAP-ON (PAD) ×3 IMPLANT
PADDING CAST COTTON 6X4 STRL (CAST SUPPLIES) ×3 IMPLANT
PENCIL SMOKE EVACUATOR COATED (MISCELLANEOUS) ×3 IMPLANT
PORT APPOLLO RF 90DEGREE MULTI (SURGICAL WAND) ×3 IMPLANT
SCREW BIOCOMP 7X20 (Screw) ×3 IMPLANT
SCREW FT BIOCOMP 10X30 (Screw) ×3 IMPLANT
SET ARTHROSCOPY INST (INSTRUMENTS) ×3 IMPLANT
SET BASIN LINEN APH (SET/KITS/TRAYS/PACK) ×3 IMPLANT
SPONGE LAP 18X18 RF (DISPOSABLE) ×3 IMPLANT
STAPLER VISISTAT 35W (STAPLE) ×3 IMPLANT
SUT ETHIBOND 5 LR DA (SUTURE) ×9 IMPLANT
SUT ETHILON 3 0 FSL (SUTURE) ×3 IMPLANT
SUT MON AB 0 CT1 (SUTURE) ×3 IMPLANT
SUT MON AB 2-0 CT1 36 (SUTURE) ×3 IMPLANT
SUT VIC AB 1 CT1 27 (SUTURE) ×3
SUT VIC AB 1 CT1 27XBRD ANTBC (SUTURE) ×1 IMPLANT
SYR 30ML LL (SYRINGE) ×3 IMPLANT
SYR BULB IRRIG 60ML STRL (SYRINGE) ×6 IMPLANT
TOWEL OR 17X26 4PK STRL BLUE (TOWEL DISPOSABLE) ×3 IMPLANT
TUBE CONNECTING 12'X1/4 (SUCTIONS) ×2
TUBE CONNECTING 12X1/4 (SUCTIONS) ×4 IMPLANT
TUBING ARTHROSCOPY INFLOW/OUT (IRRIGATION / IRRIGATOR) ×3 IMPLANT
YANKAUER SUCT 12FT TUBE ARGYLE (SUCTIONS) ×3 IMPLANT

## 2020-06-02 NOTE — Anesthesia Preprocedure Evaluation (Addendum)
Anesthesia Evaluation  Patient identified by MRN, date of birth, ID band Patient awake    Reviewed: Allergy & Precautions, NPO status , Patient's Chart, lab work & pertinent test results  History of Anesthesia Complications Negative for: history of anesthetic complications  Airway Mallampati: III  TM Distance: >3 FB Neck ROM: Full    Dental  (+) Dental Advisory Given, Teeth Intact   Pulmonary Current Smoker,    Pulmonary exam normal breath sounds clear to auscultation       Cardiovascular Exercise Tolerance: Good hypertension, Pt. on medications Normal cardiovascular exam Rhythm:Regular Rate:Normal  Left ventricle: The cavity size was normal. Wall thickness was  normal. Systolic function was normal. The estimated ejection  fraction was in the range of 60% to 65%. Wall motion was normal;  there were no regional wall motion abnormalities. Left  ventricular diastolic function parameters were normal.  - Mitral valve: There was mild regurgitation.    Neuro/Psych PSYCHIATRIC DISORDERS    GI/Hepatic GERD  Medicated and Controlled,(+)     substance abuse  marijuana use,   Endo/Other  negative endocrine ROS  Renal/GU negative Renal ROS     Musculoskeletal Right knee pain   Abdominal   Peds  (+) ADHD Hematology negative hematology ROS (+)   Anesthesia Other Findings   Reproductive/Obstetrics                            Anesthesia Physical Anesthesia Plan  ASA: II  Anesthesia Plan: General   Post-op Pain Management:  Regional for Post-op pain   Induction: Intravenous  PONV Risk Score and Plan: Ondansetron, Dexamethasone and Midazolam  Airway Management Planned: LMA  Additional Equipment:   Intra-op Plan:   Post-operative Plan: Extubation in OR  Informed Consent: I have reviewed the patients History and Physical, chart, labs and discussed the procedure including the risks,  benefits and alternatives for the proposed anesthesia with the patient or authorized representative who has indicated his/her understanding and acceptance.     Dental advisory given  Plan Discussed with: CRNA and Surgeon  Anesthesia Plan Comments:         Anesthesia Quick Evaluation

## 2020-06-02 NOTE — Anesthesia Procedure Notes (Addendum)
Anesthesia Regional Block: Adductor canal block   Pre-Anesthetic Checklist: ,, timeout performed, Correct Patient, Correct Site, Correct Laterality, Correct Procedure, Correct Position, site marked, Risks and benefits discussed, pre-op evaluation,  At surgeon's request and post-op pain management  Laterality: Right  Prep: chloraprep       Needles:  Injection technique: Single-shot  Needle Type: Echogenic Stimulator Needle     Needle Length: 10cm  Needle Gauge: 20   Needle insertion depth: 6 cm   Additional Needles:   Procedures:, nerve stimulator,,, ultrasound used (permanent image in chart),,,,   Nerve Stimulator or Paresthesia:  Response: Twitch elicited, no twitches at 0.5, 0.8 mA,   Additional Responses:   Narrative:  Start time: 06/02/2020 7:17 AM End time: 06/02/2020 7:24 AM  Performed by: Personally  Anesthesiologist: Molli Barrows, MD  Additional Notes: BP cuff, EKG monitors applied. Sedation begun.  After nerve location 28 ml of bupivacaine 0.5% with epi 1:200000 and dexamethasone 8 mg injected incrementally, slowly , and after neg aspirations. Tolerated well.

## 2020-06-02 NOTE — Progress Notes (Signed)
Reviewed discharge instructions and Cryo cuff educaton with patient and wife. Both verbalize understanding and have no further questions.

## 2020-06-02 NOTE — Anesthesia Procedure Notes (Addendum)
Anesthesia Regional Block: Knee block (IPACK block)   Pre-Anesthetic Checklist: ,, timeout performed, Correct Patient, Correct Site, Correct Laterality, Correct Procedure, Correct Position, site marked, Risks and benefits discussed, at surgeon's request and post-op pain management  Laterality: Right  Prep: chloraprep       Needles:  Injection technique: Single-shot  Needle Type: Echogenic Needle     Needle Length: 10cm  Needle Gauge: 20   Needle insertion depth: 7 cm   Additional Needles:   Procedures:, nerve stimulator,,,,,,,  Narrative:   Performed by: Personally  Anesthesiologist: Molli Barrows, MD  Additional Notes: IPACK Block - BP cuff, EKG monitors applied. Sedation begun.20 ml of bupivacaine 0.25% with epi injected incrementally, slowly , and after neg aspirations. Tolerated well.

## 2020-06-02 NOTE — Interval H&P Note (Signed)
History and Physical Interval Note:  06/02/2020 7:29 AM  Joseph Blair  has presented today for surgery, with the diagnosis of right knee Anterior Cruciate Ligament tear and medial meniscal tear.  The various methods of treatment have been discussed with the patient and family. After consideration of risks, benefits and other options for treatment, the patient has consented to  Procedure(s): ANTERIOR CRUCIATE LIGAMENT (ACL) REPAIR and Medial  Meniscal repair (Right) as a surgical intervention.  The patient's history has been reviewed, patient examined, no change in status, stable for surgery.  I have reviewed the patient's chart and labs.  Questions were answered to the patient's satisfaction.     Fuller Canada

## 2020-06-02 NOTE — Anesthesia Postprocedure Evaluation (Signed)
Anesthesia Post Note  Patient: Joseph Blair  Procedure(s) Performed: RIGHT ANTERIOR CRUCIATE LIGAMENT (ACL) REPAIR (Right Knee) RIGHT KNEE ARTHROSCOPY WITH MEDIAL MENISECTOMY (Right Knee)  Patient location during evaluation: PACU Anesthesia Type: General Level of consciousness: responds to stimulation and patient cooperative Pain management: pain level controlled Vital Signs Assessment: post-procedure vital signs reviewed and stable Respiratory status: spontaneous breathing, respiratory function stable and patient connected to nasal cannula oxygen Cardiovascular status: blood pressure returned to baseline Postop Assessment: no apparent nausea or vomiting Anesthetic complications: no   No complications documented.   Last Vitals:  Vitals:   06/02/20 0730 06/02/20 1007  BP: 137/88 (!) 150/79  Pulse: 75 81  Resp: 18 15  Temp:  36.9 C  SpO2: 99% 99%    Last Pain:  Vitals:   06/02/20 0646  TempSrc: Oral  PainSc: 3                  Mikaya Bunner
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
## 2020-06-02 NOTE — Transfer of Care (Signed)
Immediate Anesthesia Transfer of Care Note  Patient: Joseph Blair  Procedure(s) Performed: RIGHT ANTERIOR CRUCIATE LIGAMENT (ACL) REPAIR (Right Knee) RIGHT KNEE ARTHROSCOPY WITH MEDIAL MENISECTOMY (Right Knee)  Patient Location: PACU  Anesthesia Type:GA combined with regional for post-op pain  Level of Consciousness: awake, alert , oriented and patient cooperative  Airway & Oxygen Therapy: Patient Spontanous Breathing and Patient connected to nasal cannula oxygen  Post-op Assessment: Report given to RN and Post -op Vital signs reviewed and stable  Post vital signs: Reviewed and stable  Last Vitals:  Vitals Value Taken Time  BP 150/79 06/02/20 1007  Temp 36.9 C 06/02/20 1007  Pulse 84 06/02/20 1011  Resp 16 06/02/20 1011  SpO2 100 % 06/02/20 1011  Vitals shown include unvalidated device data.  Last Pain:  Vitals:   06/02/20 0646  TempSrc: Oral  PainSc: 3       Patients Stated Pain Goal: 5 (06/02/20 8786)  Complications: No complications documented.

## 2020-06-03 ENCOUNTER — Encounter (HOSPITAL_COMMUNITY): Payer: Self-pay | Admitting: Orthopedic Surgery

## 2020-06-05 NOTE — Addendum Note (Signed)
Addendum  created 06/05/20 0959 by Molli Barrows, MD   Child order released for a procedure order, Clinical Note Signed, Intraprocedure Blocks edited, Order Canceled from Note

## 2020-06-07 DIAGNOSIS — Z9889 Other specified postprocedural states: Secondary | ICD-10-CM | POA: Insufficient documentation

## 2020-06-09 ENCOUNTER — Other Ambulatory Visit: Payer: Self-pay

## 2020-06-09 ENCOUNTER — Emergency Department (HOSPITAL_COMMUNITY)
Admission: EM | Admit: 2020-06-09 | Discharge: 2020-06-09 | Disposition: A | Discharge status: Home / Self Care | Payer: Medicaid Other | Attending: Emergency Medicine | Admitting: Emergency Medicine

## 2020-06-09 ENCOUNTER — Emergency Department (HOSPITAL_COMMUNITY): Payer: Medicaid Other

## 2020-06-09 ENCOUNTER — Encounter (HOSPITAL_COMMUNITY): Payer: Self-pay | Admitting: *Deleted

## 2020-06-09 DIAGNOSIS — I309 Acute pericarditis, unspecified: Secondary | ICD-10-CM | POA: Insufficient documentation

## 2020-06-09 DIAGNOSIS — Z79899 Other long term (current) drug therapy: Secondary | ICD-10-CM | POA: Insufficient documentation

## 2020-06-09 DIAGNOSIS — F17290 Nicotine dependence, other tobacco product, uncomplicated: Secondary | ICD-10-CM | POA: Insufficient documentation

## 2020-06-09 DIAGNOSIS — I10 Essential (primary) hypertension: Secondary | ICD-10-CM | POA: Insufficient documentation

## 2020-06-09 DIAGNOSIS — R079 Chest pain, unspecified: Secondary | ICD-10-CM | POA: Diagnosis present

## 2020-06-09 DIAGNOSIS — I319 Disease of pericardium, unspecified: Secondary | ICD-10-CM

## 2020-06-09 LAB — BASIC METABOLIC PANEL
Anion gap: 14 (ref 5–15)
BUN: 17 mg/dL (ref 6–20)
CO2: 22 mmol/L (ref 22–32)
Calcium: 9.6 mg/dL (ref 8.9–10.3)
Chloride: 98 mmol/L (ref 98–111)
Creatinine, Ser: 0.78 mg/dL (ref 0.61–1.24)
GFR calc Af Amer: >60 mL/min (ref >60)
GFR calc non Af Amer: >60 mL/min (ref >60)
Glucose, Bld: 91 mg/dL (ref 70–99)
Potassium: 4.2 mmol/L (ref 3.5–5.1)
Sodium: 134 mmol/L — ABNORMAL LOW (ref 135–145)

## 2020-06-09 LAB — CBC
HCT: 47.2 % (ref 39.0–52.0)
Hemoglobin: 15.9 g/dL (ref 13.0–17.0)
MCH: 32.7 pg (ref 26.0–34.0)
MCHC: 33.7 g/dL (ref 30.0–36.0)
MCV: 97.1 fL (ref 80.0–100.0)
Platelets: 259 10*3/uL (ref 150–400)
RBC: 4.86 MIL/uL (ref 4.22–5.81)
RDW: 13.3 % (ref 11.5–15.5)
WBC: 13.4 10*3/uL — ABNORMAL HIGH (ref 4.0–10.5)
nRBC: 0 % (ref 0.0–0.2)

## 2020-06-09 LAB — D-DIMER, QUANTITATIVE: D-Dimer, Quant: 0.48 ug/mL-FEU (ref 0.00–0.50)

## 2020-06-09 LAB — TROPONIN I (HIGH SENSITIVITY)
Troponin I (High Sensitivity): 21 ng/L — ABNORMAL HIGH (ref <18)
Troponin I (High Sensitivity): 27 ng/L — ABNORMAL HIGH (ref <18)

## 2020-06-09 MED ORDER — NAPROXEN 500 MG PO TABS: 500.0000 mg | ORAL_TABLET | Freq: Two times a day (BID) | ORAL | 0 refills | Status: DC

## 2020-06-09 MED ORDER — COLCHICINE 0.6 MG PO TABS: 0.6000 mg | ORAL_TABLET | Freq: Two times a day (BID) | ORAL | 0 refills | Status: DC

## 2020-06-09 NOTE — ED Triage Notes (Signed)
Pt with mid cp for about 2 hours now.  Recent surgery a week ago to right knee. Pt with mild SOB

## 2020-06-09 NOTE — ED Provider Notes (Signed)
[facility] EMERGENCY DEPARTMENT Provider Note   CSN: [id number] Arrival date & time: 06/09/20  1653     History Chief Complaint  Patient presents with  . Chest Pain    Darrelyn HillockCharlie D Vallee is a 29 y.o. male.  HPI   29 year old male, he has a known history of hypertension, history of acid reflux, taking medications for this.  Evidently the patient did have a history of acute myopericarditis years ago.  Based on the patient's notes from 2017 when this occurred he had a viral myocarditis and was started on an anti-inflammatory with colchicine after a normal echocardiogram he was discharged very stable.  He has not had any pain since that time.  He reports that approximately 1 week after undergoing repair of an anterior cruciate ligament tear and a medial meniscus tear by Dr. Romeo AppleHarrison and wearing a knee brace intermittently he had developed a small amount of chest pain today.  He has ongoing chest pain at this time, he states that it is mild, it is sometimes sharp, he feels it in the middle of his chest, there is no radiation of the pain, no shortness of breath, no swelling of the legs.  He has not had any fevers or chills, he smokes occasional marijuana but is never used IV drugs.  He is not having any coughing, is not taking any medications.  Past Medical History:  Diagnosis Date  . ADHD (attention deficit hyperactivity disorder)   . GERD (gastroesophageal reflux disease)   . Hypertension   . Pancreas disorder 2013   patient reports he was told he might have pancreatitis. No elevated lipase or imaging to support per EPIC review    Patient Active Problem List   Diagnosis Date Noted  . S/P ACL repair right 06/02/20 06/07/2020  . 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 Derangement of posterior horn of medial meniscus of right knee   . Abnormal LFTs 02/02/2019  . Acute myopericarditis 04/05/2016  . Chest pain 04/05/2016  . Elevated troponin 04/05/2016  .  Right ACL tear 11/16/2015  . ACL (anterior cruciate ligament) rupture 05/05/2013  . 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 Intermittent explosive disorder 10/03/2011    Past Surgical History:  Procedure Laterality Date  . ANTERIOR CRUCIATE LIGAMENT REPAIR     Left knee Dr. Edger HouseMcGinley  . ANTERIOR CRUCIATE LIGAMENT REPAIR Right 06/02/2020   Procedure: RIGHT ANTERIOR CRUCIATE LIGAMENT (ACL) REPAIR WITH ALLOGRAFT;  Surgeon: Vickki HearingHarrison, Stanley E, MD;  Location: [facility];  Service: Orthopedics;  Laterality: Right;  . HERNIA REPAIR    . KNEE ARTHROSCOPY WITH MEDIAL MENISECTOMY Right 06/02/2020   Procedure: RIGHT KNEE ARTHROSCOPY WITH MEDIAL MENISECTOMY;  Surgeon: Vickki HearingHarrison, Stanley E, MD;  Location: [facility];  Service: Orthopedics;  Laterality: Right;       Family History  Problem Relation Age of Onset  . ADD / ADHD Brother   . ADD / ADHD Maternal Aunt   . Drug abuse Maternal Uncle   . Alcohol abuse Maternal Uncle   . Schizophrenia Maternal Uncle   . Colon cancer Paternal Aunt        29 years old  . Anxiety disorder Neg Hx   . Bipolar disorder Neg Hx   . Dementia Neg Hx   . Depression Neg Hx   . OCD Neg Hx   . Paranoid behavior Neg Hx   . Seizures Neg Hx   . Sexual abuse Neg Hx   .  Physical abuse Neg Hx   . Liver disease Neg Hx     Social History   Tobacco Use  . Smoking status: Current Some Day Smoker    Types: Cigars  . Smokeless tobacco: Never Used  Substance Use Topics  . Alcohol use: Yes    Comment: prior to 11/2018 was drinking heavy etoh daily, now doesn't drink every day (now pint brown liqour a day)  . Drug use: Not Currently    Types: Marijuana    Home Medications Prior to Admission medications   Medication Sig Start Date End Date Taking? Authorizing Provider  colchicine 0.6 MG tablet Take 1 tablet (0.6 mg total) by mouth 2 (two) times daily. 06/09/20   Eber Hong, MD  hydrochlorothiazide (HYDRODIURIL) 25 MG tablet Take 25 mg by  mouth daily. 02/18/20   [provider]  HYDROcodone-acetaminophen (NORCO) 10-325 MG tablet Take 1 tablet by mouth every 4 (four) hours as needed. 06/02/20   Vickki Hearing, MD  ibuprofen (ADVIL) 800 MG tablet Take 1 tablet (800 mg total) by mouth every 8 (eight) hours as needed. 06/02/20   Vickki Hearing, MD  lisinopril (PRINIVIL,ZESTRIL) 10 MG tablet Take 10 mg by mouth daily. 11/13/14   [provider]  naproxen (NAPROSYN) 500 MG tablet Take 1 tablet (500 mg total) by mouth 2 (two) times daily with a meal. 06/09/20   Eber Hong, MD  omeprazole (PRILOSEC) 20 MG capsule Take 20 mg by mouth daily.    [provider]  promethazine (PHENERGAN) 12.5 MG tablet Take 1 tablet (12.5 mg total) by mouth every 6 (six) hours as needed for nausea or vomiting. 06/02/20   Vickki Hearing, MD    Allergies    Bee venom  Review of Systems   Review of Systems  All other systems reviewed and are negative.   Physical Exam Updated Vital Signs BP (!) 170/108 (BP Location: Left Arm)   Pulse (!) 101   Temp 98.4 F (36.9 C) (Oral)   Resp 16   Ht 1.676 m (5\' 6" )   Wt 101.2 kg   SpO2 95%   BMI 35.99 kg/m   Physical Exam Vitals and nursing note reviewed.  Constitutional:      General: He is not in acute distress.    Appearance: He is well-developed.  HENT:     Head: Normocephalic and atraumatic.     Mouth/Throat:     Pharynx: No oropharyngeal exudate.  Eyes:     General: No scleral icterus.       Right eye: No discharge.        Left eye: No discharge.     Conjunctiva/sclera: Conjunctivae normal.     Pupils: Pupils are equal, round, and reactive to light.  Neck:     Thyroid: No thyromegaly.     Vascular: No JVD.  Cardiovascular:     Rate and Rhythm: Normal rate and regular rhythm.     Heart sounds: Normal heart sounds. No murmur heard.  No friction rub. No gallop.      Comments: There are no murmurs rubs or gallops, his heart sounds are very clear, his  heart rate is approximately 95 bpm Pulmonary:     Effort: Pulmonary effort is normal. No respiratory distress.     Breath sounds: Normal breath sounds. No wheezing or rales.  Abdominal:     General: Bowel sounds are normal. There is no distension.     Palpations: Abdomen is soft. There is no mass.  Tenderness: There is no abdominal tenderness.  Musculoskeletal:        General: No tenderness. Normal range of motion.     Cervical back: Normal range of motion and neck supple.  Lymphadenopathy:     Cervical: No cervical adenopathy.  Skin:    General: Skin is warm and dry.     Findings: No erythema or rash.  Neurological:     Mental Status: He is alert.     Coordination: Coordination normal.  Psychiatric:        Behavior: Behavior normal.     ED Results / Procedures / Treatments   Labs (all labs ordered are listed, but only abnormal results are displayed) Labs Reviewed  BASIC METABOLIC PANEL - Abnormal; Notable for the following components:      Result Value   Sodium 134 (*)    All other components within normal limits  CBC - Abnormal; Notable for the following components:   WBC 13.4 (*)    All other components within normal limits  TROPONIN I (HIGH SENSITIVITY) - Abnormal; Notable for the following components:   Troponin I (High Sensitivity) 21 (*)    All other components within normal limits  TROPONIN I (HIGH SENSITIVITY) - Abnormal; Notable for the following components:   Troponin I (High Sensitivity) 27 (*)    All other components within normal limits  D-DIMER, QUANTITATIVE (NOT AT [facility])    EKG EKG Interpretation  Date/Time:  Thursday June 09 2020 17:34:32 EDT Ventricular Rate:  101 PR Interval:  134 QRS Duration: 78 QT Interval:  334 QTC Calculation: 433 R Axis:   58 Text Interpretation: Sinus tachycardia Otherwise normal ECG Confirmed by Eber Hong ([id number]) on 06/09/2020 10:47:24 PM   Radiology DG Chest 2 View  Result Date: 06/09/2020 CLINICAL DATA:   Chest pain EXAM: CHEST - 2 VIEW COMPARISON:  07/18/2018 FINDINGS: The heart size and mediastinal contours are within normal limits. Both lungs are clear. The visualized skeletal structures are unremarkable. IMPRESSION: No active cardiopulmonary disease. Electronically Signed   By: Jasmine Pang M.D.   On: 06/09/2020 18:36    Procedures Procedures (including critical care time)  Medications Ordered in ED Medications - No data to display  ED Course  I have reviewed the triage vital signs and the nursing notes.  Pertinent labs & imaging results that were available during my care of the patient were reviewed by me and considered in my medical decision making (see chart for details).    MDM Rules/Calculators/A&P                          Despite wearing the brace the patient has no edema of the legs.  He is not coughing, and he has no signs of ischemia on his EKG.  He does not fact have a history of myocarditis or pericarditis, his echocardiogram was unremarkable.  Today his EKG is unremarkable, his troponin level is low at 21 and 27, this is very unlikely to be related ischemic heart disease, it may be related to pericarditis but at this time he is afebrile, his vital signs are reassuring, I will treat him with an anti-inflammatory and colchicine, the patient can follow-up this week.  I do not think that he needs to be admitted to this hospital.  The patient is agreeable  The D-dimer is negative, I do not think he needs pulmonary embolism evaluation  Final Clinical Impression(s) / ED Diagnoses Final diagnoses:  Pericarditis, unspecified  chronicity, unspecified type    Rx / DC Orders ED Discharge Orders         Ordered    colchicine 0.6 MG tablet  2 times daily        06/09/20 2253    naproxen (NAPROSYN) 500 MG tablet  2 times daily with meals        06/09/20 2253           Eber Hong, MD 06/09/20 [phone]

## 2020-06-09 NOTE — Discharge Instructions (Addendum)
Your testing today is rather unremarkable, there is a slight elevation in the troponin which means that you probably have the same thing that she had in 2017.  Sometimes this is related to a virus but sometimes it is just related to inflammation around the heart so I want you to take the following medications  Colchicine, 0.6 mg twice a day for the next week, please be aware that this may cause diarrhea  Naproxen 500 mg twice a day for the next week  You will need to follow-up with your doctor in the office within the next 3 or 4 days for recheck but I want you to come back to the emergency department immediately for any severe worsening symptoms.  Please rest at home, do only very mild activities around the house and get plenty of fluids.

## 2020-06-10 ENCOUNTER — Ambulatory Visit (INDEPENDENT_AMBULATORY_CARE_PROVIDER_SITE_OTHER): Payer: Medicaid Other | Admitting: Orthopedic Surgery

## 2020-06-10 ENCOUNTER — Telehealth: Payer: Self-pay | Admitting: *Deleted

## 2020-06-10 DIAGNOSIS — Z9889 Other specified postprocedural states: Secondary | ICD-10-CM

## 2020-06-10 MED ORDER — HYDROCODONE-ACETAMINOPHEN 7.5-325 MG PO TABS
1.0000 | ORAL_TABLET | Freq: Four times a day (QID) | ORAL | 0 refills | Status: AC | PRN
Start: 2020-06-10 — End: 2020-06-17

## 2020-06-10 MED ORDER — HYDROCODONE-ACETAMINOPHEN 7.5-325 MG PO TABS
1.0000 | ORAL_TABLET | Freq: Four times a day (QID) | ORAL | 0 refills | Status: DC | PRN
Start: 2020-06-10 — End: 2020-06-10

## 2020-06-10 NOTE — Progress Notes (Signed)
POST OP ACL  Chief Complaint  Patient presents with  . Knee Injury    POST OP ACL,RT/DOS 06/02/20    ACL ALLOGRAFT  MED MENISECTOMY N/S LAT MEN TEAR  CHONDROSIS MFC   Joseph Blair comes in for his first postop visit he has no signs of neurovascular deficit or compartment syndrome he is weight-bear as tolerated with crutches in a locked brace he can start physical therapy  Refill hydrocodone 7.5 mg  Meds ordered this encounter  Medications  . DISCONTD: HYDROcodone-acetaminophen (NORCO) 7.5-325 MG tablet    Sig: Take 1 tablet by mouth every 6 (six) hours as needed for moderate pain.    Dispense:  28 tablet    Refill:  0  . HYDROcodone-acetaminophen (NORCO) 7.5-325 MG tablet    Sig: Take 1 tablet by mouth every 6 (six) hours as needed for up to 7 days for moderate pain.    Dispense:  28 tablet    Refill:  0

## 2020-06-10 NOTE — Addendum Note (Signed)
Addended byCaffie Damme on: 06/10/2020 10:56 AM   Modules accepted: Orders

## 2020-06-10 NOTE — Telephone Encounter (Signed)
Pharmacy called related to Rx: colchicine 0.6 MG tablet not being covered by pt insurance .Marland KitchenMarland KitchenEDCM clarified with EDP  to change Rx to: [employer]ig[employer] as it is covered.

## 2020-06-16 ENCOUNTER — Other Ambulatory Visit: Payer: Self-pay | Admitting: Radiology

## 2020-06-16 ENCOUNTER — Encounter: Payer: Self-pay | Admitting: Orthopedic Surgery

## 2020-06-16 ENCOUNTER — Ambulatory Visit (INDEPENDENT_AMBULATORY_CARE_PROVIDER_SITE_OTHER): Payer: Medicaid Other | Admitting: Radiology

## 2020-06-16 ENCOUNTER — Other Ambulatory Visit: Payer: Self-pay

## 2020-06-16 ENCOUNTER — Other Ambulatory Visit: Payer: Self-pay | Admitting: Orthopedic Surgery

## 2020-06-16 DIAGNOSIS — Z9889 Other specified postprocedural states: Secondary | ICD-10-CM

## 2020-06-16 MED ORDER — NAPROXEN 500 MG PO TABS
500.0000 mg | ORAL_TABLET | Freq: Two times a day (BID) | ORAL | 0 refills | Status: DC
Start: 2020-06-16 — End: 2020-11-14

## 2020-06-16 MED ORDER — HYDROCODONE-ACETAMINOPHEN 5-325 MG PO TABS
1.0000 | ORAL_TABLET | Freq: Four times a day (QID) | ORAL | 0 refills | Status: DC | PRN
Start: 2020-06-16 — End: 2020-06-30

## 2020-06-16 NOTE — Telephone Encounter (Signed)
Patient came in staple removal needs refill of Hydrocodone and Naproxen

## 2020-06-16 NOTE — Progress Notes (Signed)
Patient presented today for staple removal s/p ACL repair 06/02/20. Patient tolerated well. He will return to office in 2 weeks to follow up with Dr. Romeo Apple  Patient requesting medication refills Hydrocodone and Naproxen. Refill request sent to [name]

## 2020-06-16 NOTE — Progress Notes (Signed)
Meds ordered this encounter  Medications  . HYDROcodone-acetaminophen (NORCO/VICODIN) 5-325 MG tablet    Sig: Take 1 tablet by mouth every 6 (six) hours as needed for moderate pain.    Dispense:  30 tablet    Refill:  0  . naproxen (NAPROSYN) 500 MG tablet    Sig: Take 1 tablet (500 mg total) by mouth 2 (two) times daily with a meal.    Dispense:  30 tablet    Refill:  0

## 2020-06-24 ENCOUNTER — Ambulatory Visit (HOSPITAL_COMMUNITY): Payer: Medicaid Other | Attending: Orthopedic Surgery | Admitting: Physical Therapy

## 2020-06-24 ENCOUNTER — Encounter (HOSPITAL_COMMUNITY): Payer: Self-pay | Admitting: Physical Therapy

## 2020-06-24 ENCOUNTER — Other Ambulatory Visit: Payer: Self-pay

## 2020-06-24 ENCOUNTER — Encounter: Payer: Self-pay | Admitting: Orthopedic Surgery

## 2020-06-24 DIAGNOSIS — R262 Difficulty in walking, not elsewhere classified: Secondary | ICD-10-CM

## 2020-06-24 DIAGNOSIS — M6281 Muscle weakness (generalized): Secondary | ICD-10-CM | POA: Diagnosis present

## 2020-06-24 DIAGNOSIS — M25661 Stiffness of right knee, not elsewhere classified: Secondary | ICD-10-CM | POA: Diagnosis present

## 2020-06-24 NOTE — Therapy (Addendum)
[facility] [address] [place], [place], [address] Phone: [phone]   Fax:  [phone]  Physical Therapy Evaluation  Patient Details  Name: Joseph Blair MRN: [id number] Date of Birth: 1991/06/04 Referring Provider (PT): Joseph Blair    Encounter Date: 06/24/2020   PT End of Session - 06/24/20 1444    Visit Number 1    Number of Visits 4    Date for PT Re-Evaluation 08/05/20    Authorization Type medicaid requested 3 visits    Progress Note Due on Visit 4   PT Start Time 1400    PT Stop Time 1440    PT Time Calculation (min) 40 min    Equipment Utilized During Treatment Gait belt    Activity Tolerance Patient tolerated treatment well    Behavior During Therapy [facility] for tasks assessed/performed           Past Medical History:  Diagnosis Date  . ADHD (attention deficit hyperactivity disorder)   . GERD (gastroesophageal reflux disease)   . Hypertension   . Pancreas disorder 2013   patient reports he was told he might have pancreatitis. No elevated lipase or imaging to support per EPIC review    Past Surgical History:  Procedure Laterality Date  . ANTERIOR CRUCIATE LIGAMENT REPAIR     Left knee Dr. Edger Blair  . ANTERIOR CRUCIATE LIGAMENT REPAIR Right 06/02/2020   Procedure: RIGHT ANTERIOR CRUCIATE LIGAMENT (ACL) REPAIR WITH ALLOGRAFT;  Surgeon: Joseph Hearing, MD;  Location: [facility];  Service: Orthopedics;  Laterality: Right;  . HERNIA REPAIR    . KNEE ARTHROSCOPY WITH MEDIAL MENISECTOMY Right 06/02/2020   Procedure: RIGHT KNEE ARTHROSCOPY WITH MEDIAL MENISECTOMY;  Surgeon: Joseph Hearing, MD;  Location: [facility];  Service: Orthopedics;  Laterality: Right;    There were no vitals filed for this visit.    Subjective Assessment - 06/24/20 1416    Subjective Mr. Joseph Blair states that he injured his right knee playing basketball in 2016.  When he injured it he never had it checked out but when his leg started  giving out on him in February he had it checked out and found out that his ACL had been ruptured.  He had his repair on 06/02/2020.  He currently has a knee brace which he is to wear when he is up.    Pertinent History ACL tear    Limitations Standing;Walking;Blair hold activities    How long can you stand comfortably? 3-5 minutes with brace    How long can you walk comfortably? with brace on 20  minutes occasionally uses crutch    Currently in Pain? Yes    Pain Score 3     Pain Location Knee    Pain Orientation Right    Pain Descriptors / Indicators Aching;Pressure    Pain Type Acute pain;Surgical pain              [facility] PT Assessment - 06/24/20 0001      Assessment   Medical Diagnosis Rt ACL    Referring Provider (PT) Joseph Blair     Onset Date/Surgical Date 06/02/20      Precautions   Precautions Other (comment)    Precaution Comments ACL      Balance Screen   Has the patient fallen in the past 6 months Yes    How many times? 4      Home Nurse, mental health Private residence    Home Access Stairs  to enter    Entrance Stairs-Number of Steps 7    Entrance Stairs-Rails Right      Prior Function   Level of Independence Independent    Vocation Requirements plumber       Cognition   Overall Cognitive Status Within Functional Limits for tasks assessed      ROM / Strength   AROM / PROM / Strength AROM;Strength      AROM   AROM Assessment Site Knee    Right/Left Knee Right    Right Knee Extension 3    Right Knee Flexion 83      Strength   Strength Assessment Site Hip;Knee    Right/Left Hip Right    Right Hip Flexion 3-/5   extension lag    Right Hip Extension 5/5    Right Hip ABduction 5/5    Right/Left Knee --   deferred                      Objective measurements completed on examination: See above findings.       [facility] Adult PT Treatment/Exercise - 06/24/20 0001      Exercises   Exercises Knee/Hip      Knee/Hip  Exercises: Standing   Heel Raises Both;10 reps    Functional Squat 5 reps      Knee/Hip Exercises: Supine   Quad Sets Right;10 reps    Quad Sets Limitations ham set x 10     Heel Slides 10 reps    Straight Leg Raises AROM;5 reps                  PT Education - 06/24/20 1436    Education Details HEP    Person(s) Educated Patient    Methods Explanation    Comprehension Verbalized understanding            PT Short Term Goals - 06/24/20 1705      PT SHORT TERM GOAL #1   Title PT to be I in HEP to improve function.    Time 3    Period Weeks    Status New    Target Date 07/15/20      PT SHORT TERM GOAL #2   Title PT ROM at least -4-115    Time 3    Period Weeks    Status New      PT SHORT TERM GOAL #3   Title PT pain to be no greater than a 5/10 to allow pt to be able to walk for an hour at a time.    Time 3    Period Weeks    Status New             PT Long Term Goals - 06/24/20 1707      PT LONG TERM GOAL #1   Title Pt to be I in an advance HEP to improve function.    Time 6    Period Weeks    Status New    Target Date 08/05/20      PT LONG TERM GOAL #2   Title Pt ROM to be 0-130 to allow squatting for work activities.    Time 6    Period Weeks    Status New      PT LONG TERM GOAL #3   Title PT strength of LE to be at least 4+/5 to allow lifting and squatting for work activities    Time 6    Period Weeks  Status New      PT LONG TERM GOAL #4   Title Pt pain to be no greater than a 2/10 to allow pt to be able to be wb for 4 hours at a time without brace for work activities.    Time 6    Period Weeks    Status New                  Plan - 06/24/20 1445    Clinical Impression Statement Pt is a 29 yo male who had a ACL repair on his Rt knee on 06/02/2020. Due to not being able to get in touch with the pt he is here for his first visit.  Evaluation demonstrates pt ambulating in a hinge brace locked out to 0 flexion. Therapist  increased this to 30 degrees of flexion.  Pt has decreased ROM, decreased strength and decreased activtity tolerance.  Mr. Joseph Blair will benefit from skilled PT to return him to his previous functioning level while progressing through the anterior cruciate ligament protocol.    Personal Factors and Comorbidities Fitness;Time since onset of injury/illness/exacerbation    Examination-Activity Limitations Bend;Carry;Lift;Locomotion Level;Stairs;Stand;Squat;Sit    Examination-Participation Restrictions Cleaning;Yard Work;Other;Occupation    Stability/Clinical Decision Making Stable/Uncomplicated    Clinical Decision Making Low    Rehab Potential Good    PT Frequency 3x / week    PT Duration 6 weeks    PT Treatment/Interventions Gait training;Stair training;Functional mobility training;Therapeutic activities;Therapeutic exercise;Manual techniques    PT Next Visit Plan begin step ups, patellar mobilization; prone knee flexion prone hip extension increase knee brace flexion to 60 degree bend if pt is not having any difficulty; press for SLR without extension lag. Give SLR's for HEP    PT Home Exercise Plan ankle pumps, quad and ham set, heel slide, heelraises and minisquats    Consulted and Agree with Plan of Care Patient           Patient will benefit from skilled therapeutic intervention in order to improve the following deficits and impairments:  Abnormal gait, Decreased activity tolerance, Decreased range of motion, Difficulty walking, Decreased strength, Pain, Increased edema  Visit Diagnosis: Difficulty in walking, not elsewhere classified - Plan: PT plan of care cert/re-cert  Stiffness of right knee, not elsewhere classified - Plan: PT plan of care cert/re-cert  Muscle weakness (generalized) - Plan: PT plan of care cert/re-cert     Problem List Patient Active Problem List   Diagnosis Date Noted  . S/P ACL repair right 06/02/20 06/07/2020  . 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 Derangement of posterior horn of medial meniscus of right knee   . Abnormal LFTs 02/02/2019  . Acute myopericarditis 04/05/2016  . Chest pain 04/05/2016  . Elevated troponin 04/05/2016  . Right ACL tear 11/16/2015  . ACL (anterior cruciate ligament) rupture 05/05/2013  . 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 Intermittent explosive disorder 10/03/2011    Virgina Organ, PT CLT [phone] 06/24/2020, 5:16 PM  [facility] [address] [place], [place], [address] Phone: [phone]   Fax:  [phone]  Name: Joseph Blair MRN: [id number] Date of Birth: September 13, 1991

## 2020-06-27 ENCOUNTER — Other Ambulatory Visit: Payer: Self-pay

## 2020-06-27 ENCOUNTER — Ambulatory Visit (HOSPITAL_COMMUNITY): Payer: Medicaid Other | Admitting: Physical Therapy

## 2020-06-27 ENCOUNTER — Encounter (HOSPITAL_COMMUNITY): Payer: Self-pay | Admitting: Physical Therapy

## 2020-06-27 DIAGNOSIS — R262 Difficulty in walking, not elsewhere classified: Secondary | ICD-10-CM

## 2020-06-27 DIAGNOSIS — M25661 Stiffness of right knee, not elsewhere classified: Secondary | ICD-10-CM

## 2020-06-27 DIAGNOSIS — M6281 Muscle weakness (generalized): Secondary | ICD-10-CM

## 2020-06-27 NOTE — Patient Instructions (Signed)
Access Code: [email or web address] URL: [email or web address] Date: 06/27/2020 Prepared by: Alonna Minium  Exercises Active Straight Leg Raise with Quad Set - 1 x daily - 7 x weekly - 2 sets - 10 reps Prone Knee Flexion - 1 x daily - 7 x weekly - 2 sets - 10 reps Wall Quarter Squat - 1 x daily - 7 x weekly - 1 sets - 10 reps - 10 hold

## 2020-06-27 NOTE — Therapy (Signed)
[facility] [address] [place], [place], [address] Phone: [phone]   Fax:  [phone]  Physical Therapy Treatment  Patient Details  Name: Joseph Blair MRN: [id number] Date of Birth: 1990-11-26 Referring Provider (PT): Fuller Canada    Encounter Date: 06/27/2020   PT End of Session - 06/27/20 0817    Visit Number 2    Number of Visits 18    Date for PT Re-Evaluation 08/05/20    Authorization Type medicaid    Progress Note Due on Visit 10    PT Start Time 0818    PT Stop Time 0857    PT Time Calculation (min) 39 min    Equipment Utilized During Treatment Gait belt    Activity Tolerance Patient tolerated treatment well    Behavior During Therapy [facility] for tasks assessed/performed           Past Medical History:  Diagnosis Date  . ADHD (attention deficit hyperactivity disorder)   . GERD (gastroesophageal reflux disease)   . Hypertension   . Pancreas disorder 2013   patient reports he was told he might have pancreatitis. No elevated lipase or imaging to support per EPIC review    Past Surgical History:  Procedure Laterality Date  . ANTERIOR CRUCIATE LIGAMENT REPAIR     Left knee Dr. Edger House  . ANTERIOR CRUCIATE LIGAMENT REPAIR Right 06/02/2020   Procedure: RIGHT ANTERIOR CRUCIATE LIGAMENT (ACL) REPAIR WITH ALLOGRAFT;  Surgeon: Vickki Hearing, MD;  Location: [facility];  Service: Orthopedics;  Laterality: Right;  . HERNIA REPAIR    . KNEE ARTHROSCOPY WITH MEDIAL MENISECTOMY Right 06/02/2020   Procedure: RIGHT KNEE ARTHROSCOPY WITH MEDIAL MENISECTOMY;  Surgeon: Vickki Hearing, MD;  Location: [facility];  Service: Orthopedics;  Laterality: Right;    There were no vitals filed for this visit.   Subjective Assessment - 06/27/20 0817    Subjective Patient states he has been good. His exercises are alright.    Pertinent History ACL tear    Limitations Standing;Walking;House hold activities    How long can you stand  comfortably? 3-5 minutes with brace    How long can you walk comfortably? with brace on 20  minutes occasionally uses crutch    Currently in Pain? No/denies                             [facility] Adult PT Treatment/Exercise - 06/27/20 0001      Knee/Hip Exercises: Standing   Heel Raises 20 reps    Knee Flexion 2 sets;10 reps;Right    Lateral Step Up Left;2 sets;10 reps;Hand Hold: 0;Step Height: 4"    Forward Step Up 10 reps;2 sets;Step Height: 4";Hand Hold: 0    Functional Squat 10 reps    SLS with Vectors 10x 5 second holds knee slight bend; SLS with vectors with mini squat 10x each direction    Other Standing Knee Exercises wall sit 10 x 10 second holds      Knee/Hip Exercises: Supine   Quad Sets Right;10 reps    Quad Sets Limitations 10 second holds    Heel Slides 10 reps    Heel Slides Limitations 10 second holds    Straight Leg Raises Right;2 sets;10 reps    Knee Extension AROM    Knee Extension Limitations 2    Knee Flexion AROM    Knee Flexion Limitations 98   improves to 117 following heel slides  Knee/Hip Exercises: Prone   Hamstring Curl 2 sets;10 reps    Hip Extension 2 sets;10 reps                  PT Education - 06/27/20 0817    Education Details Patient educated on HEP, exercise mechanics    Person(s) Educated Patient    Methods Explanation;Demonstration    Comprehension Verbalized understanding;Returned demonstration            PT Short Term Goals - 06/24/20 1705      PT SHORT TERM GOAL #1   Title PT to be I in HEP to improve function.    Time 3    Period Weeks    Status New    Target Date 07/15/20      PT SHORT TERM GOAL #2   Title PT ROM at least -4-115    Time 3    Period Weeks    Status New      PT SHORT TERM GOAL #3   Title PT pain to be no greater than a 5/10 to allow pt to be able to walk for an hour at a time.    Time 3    Period Weeks    Status New             PT Long Term Goals - 06/24/20 1707        PT LONG TERM GOAL #1   Title Pt to be I in an advance HEP to improve function.    Time 6    Period Weeks    Status New    Target Date 08/05/20      PT LONG TERM GOAL #2   Title Pt ROM to be 0-130 to allow squatting for work activities.    Time 6    Period Weeks    Status New      PT LONG TERM GOAL #3   Title PT strength of LE to be at least 4+/5 to allow lifting and squatting for work activities    Time 6    Period Weeks    Status New      PT LONG TERM GOAL #4   Title Pt pain to be no greater than a 2/10 to allow pt to be able to be wb for 4 hours at a time without brace for work activities.    Time 6    Period Weeks    Status New                 Plan - 06/27/20 0817    Clinical Impression Statement Patient demonstrates minimal extensor lag on RLE secondary to impaired quad strength. Patient demonstrates improving knee flexion ROM to 98 at beginning of session which improves to 117 following heel slides. Patient able to complete step up without UE support. Patient brace unlocked to 50 degrees today. Patient requires min verbal cueing for sequencing with stair exercises but completes with good form following. Patient weight shifts off RLE with wall sits secondary to weakness which improves with cueing for equal weight bearing. Patient demonstrates impaired balance with SLS with vectors requiring intermittent UE support. Patient requires unilateral UE support with SLS vectors with mini squat secondary to weakness and impaired balance. Patient will continue to benefit from skilled physical therapy in order to reduce impairment and improve function.    Personal Factors and Comorbidities Fitness;Time since onset of injury/illness/exacerbation    Examination-Activity Limitations Bend;Carry;Lift;Locomotion Level;Stairs;Stand;Squat;Sit    Examination-Participation Restrictions Cleaning;[employer]  Work;Other;Occupation    Stability/Clinical Decision Making Stable/Uncomplicated    Rehab  Potential Good    PT Frequency 3x / week    PT Duration 6 weeks    PT Treatment/Interventions Gait training;Stair training;Functional mobility training;Therapeutic activities;Therapeutic exercise;Manual techniques    PT Next Visit Plan increase knee brace flexion to 60 degree bend if pt is not having any difficulty; press for SLR without extension lag.    PT Home Exercise Plan ankle pumps, quad and ham set, heel slide, heelraises and minisquats 9/13 prone hamstring curl, SLR wall sit   Consulted and Agree with Plan of Care Patient           Patient will benefit from skilled therapeutic intervention in order to improve the following deficits and impairments:  Abnormal gait, Decreased activity tolerance, Decreased range of motion, Difficulty walking, Decreased strength, Pain, Increased edema  Visit Diagnosis: Difficulty in walking, not elsewhere classified  Stiffness of right knee, not elsewhere classified  Muscle weakness (generalized)     Problem List Patient Active Problem List   Diagnosis Date Noted  . S/P ACL repair right 06/02/20 06/07/2020  . 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 Derangement of posterior horn of medial meniscus of right knee   . Abnormal LFTs 02/02/2019  . Acute myopericarditis 04/05/2016  . Chest pain 04/05/2016  . Elevated troponin 04/05/2016  . Right ACL tear 11/16/2015  . ACL (anterior cruciate ligament) rupture 05/05/2013  . 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 Intermittent explosive disorder 10/03/2011    9:01 AM, 06/27/20 Wyman Songster PT, DPT Physical Therapist at [facility]   [facility] [address] [place], [place], [address] Phone: [phone]   Fax:  [phone]  Name: Joseph Blair MRN: [id number] Date of Birth: 07/07/1991

## 2020-06-30 ENCOUNTER — Other Ambulatory Visit: Payer: Self-pay

## 2020-06-30 ENCOUNTER — Encounter: Payer: Self-pay | Admitting: Orthopedic Surgery

## 2020-06-30 ENCOUNTER — Ambulatory Visit (INDEPENDENT_AMBULATORY_CARE_PROVIDER_SITE_OTHER): Payer: Medicaid Other | Admitting: Orthopedic Surgery

## 2020-06-30 DIAGNOSIS — Z9889 Other specified postprocedural states: Secondary | ICD-10-CM

## 2020-06-30 MED ORDER — IBUPROFEN 800 MG PO TABS
800.0000 mg | ORAL_TABLET | Freq: Three times a day (TID) | ORAL | 1 refills | Status: DC | PRN
Start: 2020-06-30 — End: 2021-03-05

## 2020-06-30 MED ORDER — HYDROCODONE-ACETAMINOPHEN 5-325 MG PO TABS: 1.0000 | ORAL_TABLET | Freq: Four times a day (QID) | ORAL | 0 refills | Status: DC | PRN

## 2020-06-30 NOTE — Progress Notes (Signed)
Chief Complaint  Patient presents with  . Routine Post Op    06/02/20 right ACL   . Wound Check    incision ? stitch   . Medication Refill    hydrocodone    PT in [place];  POD 28   STITCH NON DISSOLVED   -alcohol , neosporin, band aid   Patient doing well range of motion return to normal no swelling Lachman test stable  Ambulating without assistive device other than brace  Recommend continued physical therapy taper hydrocodone continue ibuprofen follow-up 4 weeks   Meds ordered this encounter  Medications  . HYDROcodone-acetaminophen (NORCO/VICODIN) 5-325 MG tablet    Sig: Take 1 tablet by mouth every 6 (six) hours as needed for moderate pain.    Dispense:  30 tablet    Refill:  0  . ibuprofen (ADVIL) 800 MG tablet    Sig: Take 1 tablet (800 mg total) by mouth every 8 (eight) hours as needed.    Dispense:  90 tablet    Refill:  1

## 2020-07-04 ENCOUNTER — Other Ambulatory Visit: Payer: Self-pay

## 2020-07-04 ENCOUNTER — Ambulatory Visit (HOSPITAL_COMMUNITY): Payer: Medicaid Other | Admitting: Physical Therapy

## 2020-07-04 DIAGNOSIS — R262 Difficulty in walking, not elsewhere classified: Secondary | ICD-10-CM | POA: Diagnosis not present

## 2020-07-04 DIAGNOSIS — M25661 Stiffness of right knee, not elsewhere classified: Secondary | ICD-10-CM

## 2020-07-04 DIAGNOSIS — M6281 Muscle weakness (generalized): Secondary | ICD-10-CM

## 2020-07-04 NOTE — Therapy (Signed)
[facility] [address] [place], [place], [address] Phone: [phone]   Fax:  [phone]  Physical Therapy Treatment  Patient Details  Name: Joseph Blair MRN: [id number] Date of Birth: 1991/01/22 Referring Provider (PT): Fuller Canada    Encounter Date: 07/04/2020   PT End of Session - 07/04/20 1629    Visit Number 3    Number of Visits 18    Date for PT Re-Evaluation 08/05/20    Authorization Type medicaid    Authorization Time Period More authorization requested CHECK APPROVAL    Progress Note Due on Visit 10    PT Start Time 1611   PT arrived late   PT Stop Time 1645    PT Time Calculation (min) 34 min    Activity Tolerance Patient tolerated treatment well    Behavior During Therapy [facility] for tasks assessed/performed           Past Medical History:  Diagnosis Date   ADHD (attention deficit hyperactivity disorder)    GERD (gastroesophageal reflux disease)    Hypertension    Pancreas disorder 2013   patient reports he was told he might have pancreatitis. No elevated lipase or imaging to support per EPIC review    Past Surgical History:  Procedure Laterality Date   ANTERIOR CRUCIATE LIGAMENT REPAIR     Left knee Dr. Edger House   ANTERIOR CRUCIATE LIGAMENT REPAIR Right 06/02/2020   Procedure: RIGHT ANTERIOR CRUCIATE LIGAMENT (ACL) REPAIR WITH ALLOGRAFT;  Surgeon: Vickki Hearing, MD;  Location: [facility];  Service: Orthopedics;  Laterality: Right;   HERNIA REPAIR     KNEE ARTHROSCOPY WITH MEDIAL MENISECTOMY Right 06/02/2020   Procedure: RIGHT KNEE ARTHROSCOPY WITH MEDIAL MENISECTOMY;  Surgeon: Vickki Hearing, MD;  Location: [facility];  Service: Orthopedics;  Laterality: Right;    There were no vitals filed for this visit.   Subjective Assessment - 07/04/20 1628    Subjective He reports he did have a fall, but that his knee didn't hit the ground and he didn't feel that his knee twisted or was injured.  Reports discomfort with SLR around tibial tubercle, but not bad.    Pertinent History ACL tear    Limitations Standing;Walking;House hold activities    How long can you stand comfortably? 3-5 minutes with brace    How long can you walk comfortably? with brace on 20  minutes occasionally uses crutch    Currently in Pain? No/denies              [facility] PT Assessment - 07/04/20 0001      Assessment   Medical Diagnosis Rt ACL    Referring Provider (PT) Fuller Canada     Onset Date/Surgical Date 06/02/20      Observation/Other Assessments-Edema    Edema Circumferential      Circumferential Edema   Circumferential - Right 41 cm at joint line    Circumferential - Left  40 cm at joint line      AROM   Right/Left Knee Right    Right Knee Extension 0    Right Knee Flexion 113                         [facility] Adult PT Treatment/Exercise - 07/04/20 0001      Knee/Hip Exercises: Standing   Heel Raises 20 reps    Forward Step Up 10 reps;2 sets;Step Height: 4";Hand Hold: 0    Functional Squat 10  reps      Knee/Hip Exercises: Supine   Quad Sets Right;10 reps    Quad Sets Limitations 10 seconds holds    Heel Slides 10 reps    Heel Slides Limitations 10 second holds    Straight Leg Raises Right;2 sets;10 reps    Knee Extension AROM    Knee Extension Limitations 0    Knee Flexion AROM    Knee Flexion Limitations 113      Knee/Hip Exercises: Prone   Hamstring Curl 2 sets;10 reps    Hip Extension 10 reps;1 set                  PT Education - 07/04/20 1659    Education Details Discussed re-assessment of goals, importance of continuing HEP, and following up with MD if concerned about the fall he had.    Person(s) Educated Patient    Methods Explanation    Comprehension Verbalized understanding            PT Short Term Goals - 07/04/20 1620      PT SHORT TERM GOAL #1   Title PT to be I in HEP to improve function.    Baseline 07/04/20: Reports  intermittent compliance    Time 3    Period Weeks    Status On-going    Target Date 07/15/20      PT SHORT TERM GOAL #2   Title PT ROM at least -4-115    Baseline 07/04/20: 0-113 degrees    Time 3    Period Weeks    Status On-going      PT SHORT TERM GOAL #3   Title PT pain to be no greater than a 5/10 to allow pt to be able to walk for an hour at a time.    Baseline 07/04/20: Patient reports ability to ambulate for at least 1 hour with no more than 5/10 pain    Time 3    Period Weeks    Status Achieved             PT Long Term Goals - 07/04/20 1627      PT LONG TERM GOAL #1   Title Pt to be I in an advance HEP to improve function.    Time 6    Period Weeks    Status On-going      PT LONG TERM GOAL #2   Title Pt ROM to be 0-130 to allow squatting for work activities.    Time 6    Period Weeks    Status On-going      PT LONG TERM GOAL #3   Title PT strength of LE to be at least 4+/5 to allow lifting and squatting for work activities    Time 6    Period Weeks    Status New      PT LONG TERM GOAL #4   Title Pt pain to be no greater than a 2/10 to allow pt to be able to be wb for 4 hours at a time without brace for work activities.    Time 6    Period Weeks    Status On-going                 Plan - 07/04/20 1701    Clinical Impression Statement Patient arrived late which limited session. Patient reported having had a fall, but denied any excessive swelling, pain, or limitations following the fall. Therapist did measure patients AROM to be 0-113  degrees this session. As there was no significant observed increase in swelling, pain, and no significant decrease in ROM, felt that patient likely did not reinjure his knee with the fall. Educated patient on this and recommended following up with his MD if he was concerned. Also checked patients goals this session in order to submit for more insurance authorization. Patient has made improvements in ROM, but is still  progressing towards ROM goals, as well as still progressing towards increasing activity tolerance and strength. Did unlock patients brace to 60 degrees as recommended in the plan this session as patient was doing well. Patient would benefit from continued skilled physical therapy to improve his overall functional mobility.    Personal Factors and Comorbidities Fitness;Time since onset of injury/illness/exacerbation    Examination-Activity Limitations Bend;Carry;Lift;Locomotion Level;Stairs;Stand;Squat;Sit    Examination-Participation Restrictions Cleaning;Yard Work;Other;Occupation    Stability/Clinical Decision Making Stable/Uncomplicated    Rehab Potential Good    PT Frequency 3x / week    PT Duration 6 weeks    PT Treatment/Interventions Gait training;Stair training;Functional mobility training;Therapeutic activities;Therapeutic exercise;Manual techniques    PT Next Visit Plan Continue progressing according to [name] ACL protocol    PT Home Exercise Plan ankle pumps, quad and ham set, heel slide, heelraises and minisquats 9/13 prone hamstring curl, SLR    Consulted and Agree with Plan of Care Patient           Patient will benefit from skilled therapeutic intervention in order to improve the following deficits and impairments:  Abnormal gait, Decreased activity tolerance, Decreased range of motion, Difficulty walking, Decreased strength, Pain, Increased edema  Visit Diagnosis: Difficulty in walking, not elsewhere classified  Stiffness of right knee, not elsewhere classified  Muscle weakness (generalized)     Problem List Patient Active Problem List   Diagnosis Date Noted   S/P ACL repair right 06/02/20 06/07/2020   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 [place], [place], [address] Phone: [phone]   Fax:  [phone]  Name: Joseph Blair MRN: [id number] Date of Birth: 1990-11-18

## 2020-07-06 ENCOUNTER — Ambulatory Visit (HOSPITAL_COMMUNITY): Payer: Medicaid Other | Admitting: Physical Therapy

## 2020-07-06 ENCOUNTER — Other Ambulatory Visit: Payer: Self-pay

## 2020-07-06 ENCOUNTER — Telehealth (HOSPITAL_COMMUNITY): Payer: Self-pay | Admitting: Physical Therapy

## 2020-07-06 ENCOUNTER — Encounter (HOSPITAL_COMMUNITY): Payer: Medicaid Other | Admitting: Physical Therapy

## 2020-07-06 ENCOUNTER — Encounter (HOSPITAL_COMMUNITY): Payer: Self-pay | Admitting: Physical Therapy

## 2020-07-06 DIAGNOSIS — R262 Difficulty in walking, not elsewhere classified: Secondary | ICD-10-CM

## 2020-07-06 DIAGNOSIS — M6281 Muscle weakness (generalized): Secondary | ICD-10-CM

## 2020-07-06 DIAGNOSIS — M25661 Stiffness of right knee, not elsewhere classified: Secondary | ICD-10-CM

## 2020-07-06 NOTE — Therapy (Signed)
[facility] [address] [place], [place], [address] Phone: [phone]   Fax:  [phone]  Physical Therapy Treatment  Patient Details  Name: Joseph Blair MRN: [id number] Date of Birth: [birth year] Referring Provider (PT): Fuller Canada   Knee Extension AROM   Knee Extension Limitations 0   Knee Flexion AROM   Knee Flexion Limitations 120    Encounter Date: 07/06/2020   PT End of Session - 07/06/20 1310    Visit Number 4    Number of Visits 18    Date for PT Re-Evaluation 08/05/20    Authorization Type medicaid    Authorization Time Period 07/06/20-08/02/20    Authorization - Visit Number 1    Authorization - Number of Visits 12    Progress Note Due on Visit 10    PT Start Time 1303    PT Stop Time 1345    PT Time Calculation (min) 42 min    Activity Tolerance Patient tolerated treatment well    Behavior During Therapy [facility] for tasks assessed/performed           Past Medical History:  Diagnosis Date  . ADHD (attention deficit hyperactivity disorder)   . GERD (gastroesophageal reflux disease)   . Hypertension   . Pancreas disorder 2013   patient reports he was told he might have pancreatitis. No elevated lipase or imaging to support per EPIC review    Past Surgical History:  Procedure Laterality Date  . ANTERIOR CRUCIATE LIGAMENT REPAIR     Left knee Dr. Edger House  . ANTERIOR CRUCIATE LIGAMENT REPAIR Right 06/02/2020   Procedure: RIGHT ANTERIOR CRUCIATE LIGAMENT (ACL) REPAIR WITH ALLOGRAFT;  Surgeon: Vickki Hearing, MD;  Location: [facility];  Service: Orthopedics;  Laterality: Right;  . HERNIA REPAIR    . KNEE ARTHROSCOPY WITH MEDIAL MENISECTOMY Right 06/02/2020   Procedure: RIGHT KNEE ARTHROSCOPY WITH MEDIAL MENISECTOMY;  Surgeon: Vickki Hearing, MD;  Location: [facility];  Service: Orthopedics;  Laterality: Right;    There were no vitals filed for this visit.   Subjective Assessment - 07/06/20 1310     Subjective Patient says he is doing fine. Says MD appointment last week went well. Says he fell getting out of the shower on Sunday, but fell on non-operative leg. Says RLE seems to be fine. Has had no additional issues since then.    Pertinent History ACL tear    Limitations Standing;Walking;House hold activities    How long can you stand comfortably? 3-5 minutes with brace    How long can you walk comfortably? with brace on 20  minutes occasionally uses crutch    Currently in Pain? No/denies                             [facility] Adult PT Treatment/Exercise - 07/06/20 0001      Knee/Hip Exercises: Standing   Heel Raises 20 reps    Forward Step Up 10 reps;2 sets;Step Height: 4";Hand Hold: 0    Functional Squat 2 sets;10 reps    Functional Squat Limitations mini squat       Knee/Hip Exercises: Supine   Quad Sets Right;10 reps    Quad Sets Limitations 5 seconds    Heel Slides 10 reps    Heel Slides Limitations 5 seconds     Bridges Both;2 sets;10 reps    Straight Leg Raises Right;2 sets;10 reps    Knee Extension AROM  Knee Extension Limitations 0    Knee Flexion AROM    Knee Flexion Limitations 120      Knee/Hip Exercises: Prone   Hamstring Curl 2 sets;10 reps                    PT Short Term Goals - 07/04/20 1620      PT SHORT TERM GOAL #1   Title PT to be I in HEP to improve function.    Baseline 07/04/20: Reports intermittent compliance    Time 3    Period Weeks    Status On-going    Target Date 07/15/20      PT SHORT TERM GOAL #2   Title PT ROM at least -4-115    Baseline 07/04/20: 0-113 degrees    Time 3    Period Weeks    Status On-going      PT SHORT TERM GOAL #3   Title PT pain to be no greater than a 5/10 to allow pt to be able to walk for an hour at a time.    Baseline 07/04/20: Patient reports ability to ambulate for at least 1 hour with no more than 5/10 pain    Time 3    Period Weeks    Status Achieved             PT  Long Term Goals - 07/04/20 1627      PT LONG TERM GOAL #1   Title Pt to be I in an advance HEP to improve function.    Time 6    Period Weeks    Status On-going      PT LONG TERM GOAL #2   Title Pt ROM to be 0-130 to allow squatting for work activities.    Time 6    Period Weeks    Status On-going      PT LONG TERM GOAL #3   Title PT strength of LE to be at least 4+/5 to allow lifting and squatting for work activities    Time 6    Period Weeks    Status New      PT LONG TERM GOAL #4   Title Pt pain to be no greater than a 2/10 to allow pt to be able to be wb for 4 hours at a time without brace for work activities.    Time 6    Period Weeks    Status On-going                 Plan - 07/06/20 1343    Clinical Impression Statement Patient tolerated session well today. Patient shows improving AROM. Patient shows good quad control with SLR. Added hip bridges for glute and quad strengthening. Patetin notes discomfort about incision when bending knee. Educated patient on scar tissue formation and self-scar tissue message to address this issue. Patient cued on proper form and mechanics during mini squats. Patient reports no increase pain during session today. Educated patient on and issued HEP handout.    Personal Factors and Comorbidities Fitness;Time since onset of injury/illness/exacerbation    Examination-Activity Limitations Bend;Carry;Lift;Locomotion Level;Stairs;Stand;Squat;Sit    Examination-Participation Restrictions Cleaning;Yard Work;Other;Occupation    Stability/Clinical Decision Making Stable/Uncomplicated    Rehab Potential Good    PT Frequency 3x / week    PT Duration 6 weeks    PT Treatment/Interventions Gait training;Stair training;Functional mobility training;Therapeutic activities;Therapeutic exercise;Manual techniques    PT Next Visit Plan Continue progressing according to [name] ACL protocol  PT Home Exercise Plan ankle pumps, quad and ham set, heel  slide, heelraises and minisquats 9/13 prone hamstring curl, SLR 07/06/20: bridge    Consulted and Agree with Plan of Care Patient           Patient will benefit from skilled therapeutic intervention in order to improve the following deficits and impairments:  Abnormal gait, Decreased activity tolerance, Decreased range of motion, Difficulty walking, Decreased strength, Pain, Increased edema  Visit Diagnosis: Difficulty in walking, not elsewhere classified  Stiffness of right knee, not elsewhere classified  Muscle weakness (generalized)     Problem List Patient Active Problem List   Diagnosis Date Noted  . S/P ACL repair right 06/02/20 06/07/2020  . 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 Derangement of posterior horn of medial meniscus of right knee   . Abnormal LFTs 02/02/2019  . Acute myopericarditis 04/05/2016  . Chest pain 04/05/2016  . Elevated troponin 04/05/2016  . Right ACL tear 11/16/2015  . ACL (anterior cruciate ligament) rupture 05/05/2013  . 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 Intermittent explosive disorder 10/03/2011   5:46 PM, 07/06/20 Georges Lynch PT DPT  Physical Therapist with [facility]  [facility]  [phone]   [facility] [address] [place], [place], [address] Phone: [phone]   Fax:  [phone]  Name: Joseph Blair MRN: [id number] Date of Birth: [birth year]

## 2020-07-06 NOTE — Patient Instructions (Signed)
Access Code: [email or web address] URL: [email or web address] Date: 07/06/2020 Prepared by: Georges Lynch  Exercises Supine Bridge - 2 x daily - 7 x weekly - 2 sets - 10 reps

## 2020-07-07 NOTE — Telephone Encounter (Signed)
Began a telephone call to see if pt had forgotten his appointment as he was 30 minutes late when he walked into the department.  Pt had his times mixed up and will return later when there is an appointment available.  Virgina Organ, PT CLT [phone]

## 2020-07-11 ENCOUNTER — Ambulatory Visit (HOSPITAL_COMMUNITY): Payer: Medicaid Other | Admitting: Physical Therapy

## 2020-07-11 ENCOUNTER — Encounter (HOSPITAL_COMMUNITY): Payer: Self-pay | Admitting: Physical Therapy

## 2020-07-11 ENCOUNTER — Other Ambulatory Visit: Payer: Self-pay

## 2020-07-11 DIAGNOSIS — M25661 Stiffness of right knee, not elsewhere classified: Secondary | ICD-10-CM

## 2020-07-11 DIAGNOSIS — M6281 Muscle weakness (generalized): Secondary | ICD-10-CM

## 2020-07-11 DIAGNOSIS — R262 Difficulty in walking, not elsewhere classified: Secondary | ICD-10-CM | POA: Diagnosis not present

## 2020-07-11 NOTE — Therapy (Signed)
[facility] [address] [place], [place], [address] Phone: [phone]   Fax:  [phone]  Physical Therapy Treatment  Patient Details  Name: Joseph Blair MRN: [id number] Date of Birth: 04-23-1991 Referring Provider (PT): Fuller Canada    Encounter Date: 07/11/2020   PT End of Session - 07/11/20 1019    Visit Number 5    Number of Visits 18    Date for PT Re-Evaluation 08/05/20    Authorization Type medicaid authorized 12 more visits    Authorization Time Period 07/06/20-08/02/20    Authorization - Visit Number 2    Authorization - Number of Visits 12    Progress Note Due on Visit 10    PT Start Time 1000    PT Stop Time 1040    PT Time Calculation (min) 40 min    Activity Tolerance Patient tolerated treatment well    Behavior During Therapy [facility] for tasks assessed/performed           Past Medical History:  Diagnosis Date  . ADHD (attention deficit hyperactivity disorder)   . GERD (gastroesophageal reflux disease)   . Hypertension   . Pancreas disorder 2013   patient reports he was told he might have pancreatitis. No elevated lipase or imaging to support per EPIC review    Past Surgical History:  Procedure Laterality Date  . ANTERIOR CRUCIATE LIGAMENT REPAIR     Left knee Dr. Edger House  . ANTERIOR CRUCIATE LIGAMENT REPAIR Right 06/02/2020   Procedure: RIGHT ANTERIOR CRUCIATE LIGAMENT (ACL) REPAIR WITH ALLOGRAFT;  Surgeon: Vickki Hearing, MD;  Location: [facility];  Service: Orthopedics;  Laterality: Right;  . HERNIA REPAIR    . KNEE ARTHROSCOPY WITH MEDIAL MENISECTOMY Right 06/02/2020   Procedure: RIGHT KNEE ARTHROSCOPY WITH MEDIAL MENISECTOMY;  Surgeon: Vickki Hearing, MD;  Location: [facility];  Service: Orthopedics;  Laterality: Right;    There were no vitals filed for this visit.   Subjective Assessment - 07/11/20 1009    Subjective Pt is 5 weeks post op.  He continues to wear his brace stating nobody has  told him when to take it off.  The brace is still locked at 60 degrees    Pertinent History ACL tear    Limitations Standing;Walking;House hold activities    How long can you stand comfortably? 3-5 minutes with brace    How long can you walk comfortably? with brace on 20  minutes occasionally uses crutch    Currently in Pain? No/denies                             [facility] Adult PT Treatment/Exercise - 07/11/20 0001      Exercises   Exercises Knee/Hip      Knee/Hip Exercises: Aerobic   Stationary Bike 10' not included in charges       Knee/Hip Exercises: Machines for Strengthening   Cybex Knee Flexion 4PL  2xx 10     Cybex Leg Press 3 PL 2 x 10       Knee/Hip Exercises: Standing   Heel Raises --    Forward Step Up 2 sets;10 reps;Step Height: 6"    Functional Squat 1 set;10 reps    Functional Squat Limitations Rt only 1/4  squat     Wall Squat 10 reps    SLS Rt x 5       Knee/Hip Exercises: Supine   Quad Sets Right;10 reps  Heel Slides 10 reps    Heel Slides Limitations 5 seconds     Bridges Both;2 sets;10 reps    Straight Leg Raises Right;2 sets;10 reps    Knee Extension AROM    Knee Extension Limitations 0    Knee Flexion AROM    Knee Flexion Limitations 120      Knee/Hip Exercises: Prone   Hamstring Curl 2 sets;10 reps                    PT Short Term Goals - 07/04/20 1620      PT SHORT TERM GOAL #1   Title PT to be I in HEP to improve function.    Baseline 07/04/20: Reports intermittent compliance    Time 3    Period Weeks    Status On-going    Target Date 07/15/20      PT SHORT TERM GOAL #2   Title PT ROM at least -4-115    Baseline 07/04/20: 0-113 degrees    Time 3    Period Weeks    Status On-going      PT SHORT TERM GOAL #3   Title PT pain to be no greater than a 5/10 to allow pt to be able to walk for an hour at a time.    Baseline 07/04/20: Patient reports ability to ambulate for at least 1 hour with no more than 5/10  pain    Time 3    Period Weeks    Status Achieved             PT Long Term Goals - 07/04/20 1627      PT LONG TERM GOAL #1   Title Pt to be I in an advance HEP to improve function.    Time 6    Period Weeks    Status On-going      PT LONG TERM GOAL #2   Title Pt ROM to be 0-130 to allow squatting for work activities.    Time 6    Period Weeks    Status On-going      PT LONG TERM GOAL #3   Title PT strength of LE to be at least 4+/5 to allow lifting and squatting for work activities    Time 6    Period Weeks    Status New      PT LONG TERM GOAL #4   Title Pt pain to be no greater than a 2/10 to allow pt to be able to be wb for 4 hours at a time without brace for work activities.    Time 6    Period Weeks    Status On-going                 Plan - 07/11/20 1022    Clinical Impression Statement Therapist took knee brace to 90 degrees if he does well increase to 110 next session and then discharge brace next week. Updated program to reflect where pt should be in ACL protocol    Personal Factors and Comorbidities Fitness;Time since onset of injury/illness/exacerbation    Examination-Activity Limitations Bend;Carry;Lift;Locomotion Level;Stairs;Stand;Squat;Sit    Examination-Participation Restrictions Cleaning;Yard Work;Other;Occupation    Stability/Clinical Decision Making Stable/Uncomplicated    Rehab Potential Good    PT Frequency 3x / week    PT Duration 6 weeks    PT Treatment/Interventions Gait training;Stair training;Functional mobility training;Therapeutic activities;Therapeutic exercise;Manual techniques    PT Next Visit Plan Continue progressing according to [name] ACL protocol  PT Home Exercise Plan ankle pumps, quad and ham set, heel slide, heelraises and minisquats 9/13 prone hamstring curl, SLR 07/06/20: bridge;9/27 sit to stand, step up , wall squat           Patient will benefit from skilled therapeutic intervention in order to improve  the following deficits and impairments:  Abnormal gait, Decreased activity tolerance, Decreased range of motion, Difficulty walking, Decreased strength, Pain, Increased edema  Visit Diagnosis: Stiffness of right knee, not elsewhere classified  Difficulty in walking, not elsewhere classified  Muscle weakness (generalized)     Problem List Patient Active Problem List   Diagnosis Date Noted  . S/P ACL repair right 06/02/20 06/07/2020  . 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 Derangement of posterior horn of medial meniscus of right knee   . Abnormal LFTs 02/02/2019  . Acute myopericarditis 04/05/2016  . Chest pain 04/05/2016  . Elevated troponin 04/05/2016  . Right ACL tear 11/16/2015  . ACL (anterior cruciate ligament) rupture 05/05/2013  . 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 Intermittent explosive disorder 10/03/2011    Virgina Organ, PT CLT [phone] 07/11/2020, 10:46 AM  [facility] [address] [place], [place], [address] Phone: [phone]   Fax:  [phone]  Name: ZADRIAN MCCAULEY MRN: [id number] Date of Birth: 19-Apr-1991

## 2020-07-13 ENCOUNTER — Other Ambulatory Visit: Payer: Self-pay

## 2020-07-13 ENCOUNTER — Ambulatory Visit (HOSPITAL_COMMUNITY): Payer: Medicaid Other | Admitting: Physical Therapy

## 2020-07-13 ENCOUNTER — Encounter (HOSPITAL_COMMUNITY): Payer: Self-pay | Admitting: Physical Therapy

## 2020-07-13 DIAGNOSIS — M6281 Muscle weakness (generalized): Secondary | ICD-10-CM

## 2020-07-13 DIAGNOSIS — M25661 Stiffness of right knee, not elsewhere classified: Secondary | ICD-10-CM

## 2020-07-13 DIAGNOSIS — R262 Difficulty in walking, not elsewhere classified: Secondary | ICD-10-CM

## 2020-07-13 NOTE — Therapy (Signed)
[facility] [address] [place], [place], [address] Phone: [phone]   Fax:  [phone]  Physical Therapy Treatment  Patient Details  Name: Joseph Blair MRN: [id number] Date of Birth: 11-21-1990 Referring Provider (PT): Fuller Canada    Encounter Date: 07/13/2020   PT End of Session - 07/13/20 1043    Visit Number 6    Number of Visits 18    Date for PT Re-Evaluation 08/05/20    Authorization Type medicaid authorized 12 more visits    Authorization Time Period 07/06/20-08/02/20    Authorization - Visit Number 3    Authorization - Number of Visits 12    Progress Note Due on Visit 10    PT Start Time 1007    PT Stop Time 1045    PT Time Calculation (min) 38 min    Activity Tolerance Patient tolerated treatment well    Behavior During Therapy [facility] for tasks assessed/performed           Past Medical History:  Diagnosis Date  . ADHD (attention deficit hyperactivity disorder)   . GERD (gastroesophageal reflux disease)   . Hypertension   . Pancreas disorder 2013   patient reports he was told he might have pancreatitis. No elevated lipase or imaging to support per EPIC review    Past Surgical History:  Procedure Laterality Date  . ANTERIOR CRUCIATE LIGAMENT REPAIR     Left knee Dr. Edger House  . ANTERIOR CRUCIATE LIGAMENT REPAIR Right 06/02/2020   Procedure: RIGHT ANTERIOR CRUCIATE LIGAMENT (ACL) REPAIR WITH ALLOGRAFT;  Surgeon: Vickki Hearing, MD;  Location: [facility];  Service: Orthopedics;  Laterality: Right;  . HERNIA REPAIR    . KNEE ARTHROSCOPY WITH MEDIAL MENISECTOMY Right 06/02/2020   Procedure: RIGHT KNEE ARTHROSCOPY WITH MEDIAL MENISECTOMY;  Surgeon: Vickki Hearing, MD;  Location: [facility];  Service: Orthopedics;  Laterality: Right;    There were no vitals filed for this visit.   Subjective Assessment - 07/13/20 1009    Subjective Pt states that he started to go without his brace yesterday and so far he  feels good.  No soreness from last sessionl.    Pertinent History ACL tear    Limitations Standing;Walking;House hold activities    How long can you stand comfortably? 3-5 minutes with brace    How long can you walk comfortably? with brace on 20  minutes occasionally uses crutch    Currently in Pain? No/denies                             [facility] Adult PT Treatment/Exercise - 07/13/20 0001      Exercises   Exercises Knee/Hip      Knee/Hip Exercises: Aerobic   Stationary Bike 10' not included in charges       Knee/Hip Exercises: Machines for Strengthening   Cybex Knee Flexion 5 PL  2xx 15    Cybex Leg Press 4PL 2 x 15      Knee/Hip Exercises: Standing   Lateral Step Up Right;1 set;10 reps;Step Height: 4"    Forward Step Up 2 sets;10 reps;Step Height: 6"   lifting yellow ball above pt head.    Functional Squat 1 set;15 reps    Functional Squat Limitations Rt only 1/4  squat     Wall Squat 15 reps    SLS --    Rebounder yellow ball x 15       Knee/Hip  Exercises: Supine   Quad Sets Right;10 reps    Heel Slides 10 reps    Heel Slides Limitations 5 seconds     Bridges Both;2 sets;10 reps    Straight Leg Raises Right;2 sets;10 reps    Knee Extension AROM    Knee Extension Limitations 0    Knee Flexion AROM    Knee Flexion Limitations 120      Knee/Hip Exercises: Prone   Hamstring Curl 2 sets;10 reps                    PT Short Term Goals - 07/04/20 1620      PT SHORT TERM GOAL #1   Title PT to be I in HEP to improve function.    Baseline 07/04/20: Reports intermittent compliance    Time 3    Period Weeks    Status On-going    Target Date 07/15/20      PT SHORT TERM GOAL #2   Title PT ROM at least -4-115    Baseline 07/04/20: 0-113 degrees    Time 3    Period Weeks    Status On-going      PT SHORT TERM GOAL #3   Title PT pain to be no greater than a 5/10 to allow pt to be able to walk for an hour at a time.    Baseline 07/04/20: Patient  reports ability to ambulate for at least 1 hour with no more than 5/10 pain    Time 3    Period Weeks    Status Achieved             PT Long Term Goals - 07/04/20 1627      PT LONG TERM GOAL #1   Title Pt to be I in an advance HEP to improve function.    Time 6    Period Weeks    Status On-going      PT LONG TERM GOAL #2   Title Pt ROM to be 0-130 to allow squatting for work activities.    Time 6    Period Weeks    Status On-going      PT LONG TERM GOAL #3   Title PT strength of LE to be at least 4+/5 to allow lifting and squatting for work activities    Time 6    Period Weeks    Status New      PT LONG TERM GOAL #4   Title Pt pain to be no greater than a 2/10 to allow pt to be able to be wb for 4 hours at a time without brace for work activities.    Time 6    Period Weeks    Status On-going                 Plan - 07/13/20 1046    Clinical Impression Statement Pt ambulating without knee brace now.  Therapist added lateral step ups as well as rebounder as well as lifting wted ball with step ups.  Pt had no complaints with new exercises.  Added wt to hamcurl and leg press.    Personal Factors and Comorbidities Fitness;Time since onset of injury/illness/exacerbation    Examination-Activity Limitations Bend;Carry;Lift;Locomotion Level;Stairs;Stand;Squat;Sit    Examination-Participation Restrictions Cleaning;Yard Work;Other;Occupation    Stability/Clinical Decision Making Stable/Uncomplicated    Rehab Potential Good    PT Frequency 3x / week    PT Duration 6 weeks    PT Treatment/Interventions Gait training;Stair training;Functional mobility  training;Therapeutic activities;Therapeutic exercise;Manual techniques    PT Next Visit Plan Continue progressing according to [name] ACL protocol    PT Home Exercise Plan ankle pumps, quad and ham set, heel slide, heelraises and minisquats 9/13 prone hamstring curl, SLR 07/06/20: bridge;9/27 sit to stand, step up , wall  squat           Patient will benefit from skilled therapeutic intervention in order to improve the following deficits and impairments:  Abnormal gait, Decreased activity tolerance, Decreased range of motion, Difficulty walking, Decreased strength, Pain, Increased edema  Visit Diagnosis: Stiffness of right knee, not elsewhere classified  Difficulty in walking, not elsewhere classified  Muscle weakness (generalized)     Problem List Patient Active Problem List   Diagnosis Date Noted  . S/P ACL repair right 06/02/20 06/07/2020  . 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 Derangement of posterior horn of medial meniscus of right knee   . Abnormal LFTs 02/02/2019  . Acute myopericarditis 04/05/2016  . Chest pain 04/05/2016  . Elevated troponin 04/05/2016  . Right ACL tear 11/16/2015  . ACL (anterior cruciate ligament) rupture 05/05/2013  . 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 Intermittent explosive disorder 10/03/2011    Virgina Organ, PT CLT [phone] 07/13/2020, 10:49 AM  [facility] [address] [place], [place], [address] Phone: [phone]   Fax:  [phone]  Name: Joseph Blair MRN: [id number] Date of Birth: Apr 01, 1991

## 2020-07-15 ENCOUNTER — Encounter (HOSPITAL_COMMUNITY): Payer: Medicaid Other

## 2020-07-15 ENCOUNTER — Telehealth (HOSPITAL_COMMUNITY): Payer: Self-pay

## 2020-07-15 NOTE — Telephone Encounter (Signed)
pt called to cx due to he has to take his kids to the dentist

## 2020-07-18 ENCOUNTER — Ambulatory Visit (HOSPITAL_COMMUNITY): Payer: Medicaid Other | Attending: Orthopedic Surgery | Admitting: Physical Therapy

## 2020-07-18 ENCOUNTER — Other Ambulatory Visit: Payer: Self-pay

## 2020-07-18 DIAGNOSIS — M25661 Stiffness of right knee, not elsewhere classified: Secondary | ICD-10-CM | POA: Diagnosis present

## 2020-07-18 DIAGNOSIS — R262 Difficulty in walking, not elsewhere classified: Secondary | ICD-10-CM | POA: Diagnosis present

## 2020-07-18 DIAGNOSIS — M6281 Muscle weakness (generalized): Secondary | ICD-10-CM | POA: Diagnosis present

## 2020-07-18 NOTE — Therapy (Signed)
[facility] [address] [place], [place], [address] Phone: [phone]   Fax:  [phone]  Physical Therapy Treatment  Patient Details  Name: Joseph Blair MRN: [id number] Date of Birth: Mar 01, 1991 Referring Provider (PT): Fuller Canada    Encounter Date: 07/18/2020   PT End of Session - 07/18/20 1027    Visit Number 7    Number of Visits 18    Date for PT Re-Evaluation 08/05/20    Authorization Type medicaid authorized 12 more visits    Authorization Time Period 07/06/20-08/02/20    Authorization - Visit Number 4    Authorization - Number of Visits 12    Progress Note Due on Visit 10    PT Start Time 0918    PT Stop Time 1000    PT Time Calculation (min) 42 min    Activity Tolerance Patient tolerated treatment well    Behavior During Therapy [facility] for tasks assessed/performed           Past Medical History:  Diagnosis Date  . ADHD (attention deficit hyperactivity disorder)   . GERD (gastroesophageal reflux disease)   . Hypertension   . Pancreas disorder 2013   patient reports he was told he might have pancreatitis. No elevated lipase or imaging to support per EPIC review    Past Surgical History:  Procedure Laterality Date  . ANTERIOR CRUCIATE LIGAMENT REPAIR     Left knee Dr. Edger House  . ANTERIOR CRUCIATE LIGAMENT REPAIR Right 06/02/2020   Procedure: RIGHT ANTERIOR CRUCIATE LIGAMENT (ACL) REPAIR WITH ALLOGRAFT;  Surgeon: Vickki Hearing, MD;  Location: [facility];  Service: Orthopedics;  Laterality: Right;  . HERNIA REPAIR    . KNEE ARTHROSCOPY WITH MEDIAL MENISECTOMY Right 06/02/2020   Procedure: RIGHT KNEE ARTHROSCOPY WITH MEDIAL MENISECTOMY;  Surgeon: Vickki Hearing, MD;  Location: [facility];  Service: Orthopedics;  Laterality: Right;    There were no vitals filed for this visit.   Subjective Assessment - 07/18/20 1021    Subjective pt states he is doing well without pain.  States he plans on getting his  Lt knee done when his Rt is good.    Currently in Pain? No/denies                             [facility] Adult PT Treatment/Exercise - 07/18/20 0001      Knee/Hip Exercises: Aerobic   Elliptical 2' fwd, 2' bkwd      Knee/Hip Exercises: Machines for Strengthening   Cybex Knee Extension 1PL bil 2X10    Cybex Knee Flexion 5 PL  2xx 15    Cybex Leg Press 5PL 2X10 bil, Rt only 2PL 2X10    Other Machine --      Knee/Hip Exercises: Standing   Lateral Step Up Right;10 reps;Step Height: 4";2 sets    Forward Step Up 2 sets;10 reps;Step Height: 6"    Step Down Right;2 sets;10 reps;Hand Hold: 1;Step Height: 4"    Functional Squat 1 set;15 reps    Wall Squat 5 reps    Wall Squat Limitations sits for 15" each                    PT Short Term Goals - 07/04/20 1620      PT SHORT TERM GOAL #1   Title PT to be I in HEP to improve function.    Baseline 07/04/20: Reports intermittent compliance  Time 3    Period Weeks    Status On-going    Target Date 07/15/20      PT SHORT TERM GOAL #2   Title PT ROM at least -4-115    Baseline 07/04/20: 0-113 degrees    Time 3    Period Weeks    Status On-going      PT SHORT TERM GOAL #3   Title PT pain to be no greater than a 5/10 to allow pt to be able to walk for an hour at a time.    Baseline 07/04/20: Patient reports ability to ambulate for at least 1 hour with no more than 5/10 pain    Time 3    Period Weeks    Status Achieved             PT Long Term Goals - 07/04/20 1627      PT LONG TERM GOAL #1   Title Pt to be I in an advance HEP to improve function.    Time 6    Period Weeks    Status On-going      PT LONG TERM GOAL #2   Title Pt ROM to be 0-130 to allow squatting for work activities.    Time 6    Period Weeks    Status On-going      PT LONG TERM GOAL #3   Title PT strength of LE to be at least 4+/5 to allow lifting and squatting for work activities    Time 6    Period Weeks    Status New       PT LONG TERM GOAL #4   Title Pt pain to be no greater than a 2/10 to allow pt to be able to be wb for 4 hours at a time without brace for work activities.    Time 6    Period Weeks    Status On-going                 Plan - 07/18/20 1026    Clinical Impression Statement Pt will be at week 7 S/P ACL Protocol this week (10/7).  Pt is overall progressing well and at phase 3 of protocol.  Today, progressed with addition of quad strengthening via cybex and wall sitting at 15 second holds.  Static Lunges also added, all in good form with cues.  Elliptical began both forward and backward with bike discontinued.  Began single leg press with good control.  No pain or issues voiced during or at conclusion of session.    Personal Factors and Comorbidities Fitness;Time since onset of injury/illness/exacerbation    Examination-Activity Limitations Bend;Carry;Lift;Locomotion Level;Stairs;Stand;Squat;Sit    Examination-Participation Restrictions Cleaning;Yard Work;Other;Occupation    Stability/Clinical Decision Making Stable/Uncomplicated    Rehab Potential Good    PT Frequency 3x / week    PT Duration 6 weeks    PT Treatment/Interventions Gait training;Stair training;Functional mobility training;Therapeutic activities;Therapeutic exercise;Manual techniques    PT Next Visit Plan Continue progressing according to [name] ACL protocol.  Add single leg quarter squat and if able to complete begin steps; progress on to plyometrics per protocol.    PT Home Exercise Plan ankle pumps, quad and ham set, heel slide, heelraises and minisquats 9/13 prone hamstring curl, SLR 07/06/20: bridge;9/27 sit to stand, step up , wall squat           Patient will benefit from skilled therapeutic intervention in order to improve the following deficits and impairments:  Abnormal  gait, Decreased activity tolerance, Decreased range of motion, Difficulty walking, Decreased strength, Pain, Increased edema  Visit  Diagnosis: Stiffness of right knee, not elsewhere classified  Difficulty in walking, not elsewhere classified  Muscle weakness (generalized)     Problem List Patient Active Problem List   Diagnosis Date Noted  . S/P ACL repair right 06/02/20 06/07/2020  . 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 Derangement of posterior horn of medial meniscus of right knee   . Abnormal LFTs 02/02/2019  . Acute myopericarditis 04/05/2016  . Chest pain 04/05/2016  . Elevated troponin 04/05/2016  . Right ACL tear 11/16/2015  . ACL (anterior cruciate ligament) rupture 05/05/2013  . 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 Intermittent explosive disorder 10/03/2011   Lurena Nida, PTA/CLT [phone]  Lurena Nida 07/18/2020, 10:28 AM  [facility] [address] [place], [place], [address] Phone: [phone]   Fax:  [phone]  Name: Joseph Blair MRN: [id number] Date of Birth: [birth year]

## 2020-07-19 ENCOUNTER — Other Ambulatory Visit: Payer: Self-pay | Admitting: Orthopedic Surgery

## 2020-07-19 MED ORDER — HYDROCODONE-ACETAMINOPHEN 5-325 MG PO TABS
1.0000 | ORAL_TABLET | Freq: Four times a day (QID) | ORAL | 0 refills | Status: AC | PRN
Start: 2020-07-19 — End: 2020-07-24

## 2020-07-19 NOTE — Telephone Encounter (Signed)
Patient requests refill on Hydrocodone/Acetaminophen 5-325  Mgs.  Qty  30  Sig: Take 1 tablet by mouth every 6 (six) hours as needed for moderate pain.  Patient states he uses [employer]

## 2020-07-20 ENCOUNTER — Other Ambulatory Visit: Payer: Self-pay

## 2020-07-20 ENCOUNTER — Ambulatory Visit (HOSPITAL_COMMUNITY): Payer: Medicaid Other | Admitting: Physical Therapy

## 2020-07-20 DIAGNOSIS — R262 Difficulty in walking, not elsewhere classified: Secondary | ICD-10-CM

## 2020-07-20 DIAGNOSIS — M25661 Stiffness of right knee, not elsewhere classified: Secondary | ICD-10-CM

## 2020-07-20 DIAGNOSIS — M6281 Muscle weakness (generalized): Secondary | ICD-10-CM

## 2020-07-20 NOTE — Therapy (Signed)
[facility] [address] [place], [place], [address] Phone: [phone]   Fax:  [phone]  Physical Therapy Treatment  Patient Details  Name: Joseph Blair MRN: [id number] Date of Birth: Aug 02, 1991 Referring Provider (PT): Fuller Canada    Encounter Date: 07/20/2020   PT End of Session - 07/20/20 1136    Visit Number 8    Number of Visits 18    Date for PT Re-Evaluation 08/05/20    Authorization Type medicaid authorized 12 more visits    Authorization Time Period 07/06/20-08/02/20    Authorization - Visit Number 5    Authorization - Number of Visits 12    Progress Note Due on Visit 10    PT Start Time 1050    PT Stop Time 1130    PT Time Calculation (min) 40 min    Activity Tolerance Patient tolerated treatment well    Behavior During Therapy [facility] for tasks assessed/performed           Past Medical History:  Diagnosis Date  . ADHD (attention deficit hyperactivity disorder)   . GERD (gastroesophageal reflux disease)   . Hypertension   . Pancreas disorder 2013   patient reports he was told he might have pancreatitis. No elevated lipase or imaging to support per EPIC review    Past Surgical History:  Procedure Laterality Date  . ANTERIOR CRUCIATE LIGAMENT REPAIR     Left knee Dr. Edger House  . ANTERIOR CRUCIATE LIGAMENT REPAIR Right 06/02/2020   Procedure: RIGHT ANTERIOR CRUCIATE LIGAMENT (ACL) REPAIR WITH ALLOGRAFT;  Surgeon: Vickki Hearing, MD;  Location: [facility];  Service: Orthopedics;  Laterality: Right;  . HERNIA REPAIR    . KNEE ARTHROSCOPY WITH MEDIAL MENISECTOMY Right 06/02/2020   Procedure: RIGHT KNEE ARTHROSCOPY WITH MEDIAL MENISECTOMY;  Surgeon: Vickki Hearing, MD;  Location: [facility];  Service: Orthopedics;  Laterality: Right;    There were no vitals filed for this visit.   Subjective Assessment - 07/20/20 1051    Subjective pt reports no sorness or issues following last session.    Currently in  Pain? No/denies                             [facility] Adult PT Treatment/Exercise - 07/20/20 0001      Knee/Hip Exercises: Stretches   Other Knee/Hip Stretches slant board stretch 3X30"       Knee/Hip Exercises: Aerobic   Elliptical 2' fwd, 2' bkwd      Knee/Hip Exercises: Machines for Strengthening   Cybex Knee Extension 1PL bil 2X10    Cybex Knee Flexion 5 PL  2xx 15    Cybex Leg Press 5PL 2X10 bil, Rt only 2PL 2X10      Knee/Hip Exercises: Standing   Lateral Step Up Right;10 reps;2 sets;Step Height: 6"    Forward Step Up 2 sets;Step Height: 6";15 reps    Step Down Right;2 sets;10 reps;Hand Hold: 1;Step Height: 6"    Functional Squat 1 set;15 reps    Functional Squat Limitations Rt only 1/4  squat 5 reps    Wall Squat 5 reps    Wall Squat Limitations sits for 15" each    Other Standing Knee Exercises single leg quarter squats with UE assist 5 reps                    PT Short Term Goals - 07/04/20 1620  PT SHORT TERM GOAL #1   Title PT to be I in HEP to improve function.    Baseline 07/04/20: Reports intermittent compliance    Time 3    Period Weeks    Status On-going    Target Date 07/15/20      PT SHORT TERM GOAL #2   Title PT ROM at least -4-115    Baseline 07/04/20: 0-113 degrees    Time 3    Period Weeks    Status On-going      PT SHORT TERM GOAL #3   Title PT pain to be no greater than a 5/10 to allow pt to be able to walk for an hour at a time.    Baseline 07/04/20: Patient reports ability to ambulate for at least 1 hour with no more than 5/10 pain    Time 3    Period Weeks    Status Achieved             PT Long Term Goals - 07/04/20 1627      PT LONG TERM GOAL #1   Title Pt to be I in an advance HEP to improve function.    Time 6    Period Weeks    Status On-going      PT LONG TERM GOAL #2   Title Pt ROM to be 0-130 to allow squatting for work activities.    Time 6    Period Weeks    Status On-going      PT LONG  TERM GOAL #3   Title PT strength of LE to be at least 4+/5 to allow lifting and squatting for work activities    Time 6    Period Weeks    Status New      PT LONG TERM GOAL #4   Title Pt pain to be no greater than a 2/10 to allow pt to be able to be wb for 4 hours at a time without brace for work activities.    Time 6    Period Weeks    Status On-going                 Plan - 07/20/20 1131    Clinical Impression Statement Continued progression per Harrisons ACL protocol.   Pt able to maintain single leg balance on Rt LE for 30 seconds.  Began Rt single leg quarter squats this session with ability to complete only 5 reps due to weakness and discomfort.  Increased to 6" step for forward step ups and lateral step ups.  Remained on 4" for step downs.  Increased reps of most exercises as well this session.  No pain or issues at end of session.    Personal Factors and Comorbidities Fitness;Time since onset of injury/illness/exacerbation    Examination-Activity Limitations Bend;Carry;Lift;Locomotion Level;Stairs;Stand;Squat;Sit    Examination-Participation Restrictions Cleaning;Yard Work;Other;Occupation    Stability/Clinical Decision Making Stable/Uncomplicated    Rehab Potential Good    PT Frequency 3x / week    PT Duration 6 weeks    PT Treatment/Interventions Gait training;Stair training;Functional mobility training;Therapeutic activities;Therapeutic exercise;Manual techniques    PT Next Visit Plan Continue progressing according to [name] ACL protocol. At week 8 begin walking lunges and sports cord.  Complete 1RM at week 12 and begin plyometrics if quad/ham are at 65%.    PT Home Exercise Plan ankle pumps, quad and ham set, heel slide, heelraises and minisquats 9/13 prone hamstring curl, SLR 07/06/20: bridge;9/27 sit to stand, step up ,  wall squat           Patient will benefit from skilled therapeutic intervention in order to improve the following deficits and impairments:   Abnormal gait, Decreased activity tolerance, Decreased range of motion, Difficulty walking, Decreased strength, Pain, Increased edema  Visit Diagnosis: Stiffness of right knee, not elsewhere classified  Difficulty in walking, not elsewhere classified  Muscle weakness (generalized)     Problem List Patient Active Problem List   Diagnosis Date Noted  . S/P ACL repair right 06/02/20 06/07/2020  . 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 Derangement of posterior horn of medial meniscus of right knee   . Abnormal LFTs 02/02/2019  . Acute myopericarditis 04/05/2016  . Chest pain 04/05/2016  . Elevated troponin 04/05/2016  . Right ACL tear 11/16/2015  . ACL (anterior cruciate ligament) rupture 05/05/2013  . 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 Intermittent explosive disorder 10/03/2011   Lurena Nida, PTA/CLT [phone]  Lurena Nida 07/20/2020, 11:37 AM  [facility] [address] [place], [place], [address] Phone: [phone]   Fax:  [phone]  Name: IMARI SIVERTSEN MRN: [id number] Date of Birth: 13-Jun-1991

## 2020-07-22 ENCOUNTER — Ambulatory Visit (HOSPITAL_COMMUNITY): Payer: Medicaid Other

## 2020-07-22 ENCOUNTER — Other Ambulatory Visit: Payer: Self-pay

## 2020-07-22 ENCOUNTER — Encounter (HOSPITAL_COMMUNITY): Payer: Self-pay

## 2020-07-22 DIAGNOSIS — R262 Difficulty in walking, not elsewhere classified: Secondary | ICD-10-CM

## 2020-07-22 DIAGNOSIS — M25661 Stiffness of right knee, not elsewhere classified: Secondary | ICD-10-CM | POA: Diagnosis not present

## 2020-07-22 DIAGNOSIS — M6281 Muscle weakness (generalized): Secondary | ICD-10-CM

## 2020-07-22 NOTE — Therapy (Signed)
[facility] [address] [place], [place], [address] Phone: [phone]   Fax:  [phone]  Physical Therapy Treatment  Patient Details  Name: Joseph Blair MRN: [id number] Date of Birth: September 29, 1991 Referring Provider (PT): Fuller Canada    Encounter Date: 07/22/2020   PT End of Session - 07/22/20 0921    Visit Number 9    Number of Visits 18    Date for PT Re-Evaluation 08/05/20    Authorization Type medicaid authorized 12 more visits    Authorization Time Period 07/06/20-08/02/20    Authorization - Visit Number 6    Authorization - Number of Visits 12    Progress Note Due on Visit 10    PT Start Time 0918    PT Stop Time 1004    PT Time Calculation (min) 46 min    Activity Tolerance Patient tolerated treatment well    Behavior During Therapy [facility] for tasks assessed/performed           Past Medical History:  Diagnosis Date  . ADHD (attention deficit hyperactivity disorder)   . GERD (gastroesophageal reflux disease)   . Hypertension   . Pancreas disorder 2013   patient reports he was told he might have pancreatitis. No elevated lipase or imaging to support per EPIC review    Past Surgical History:  Procedure Laterality Date  . ANTERIOR CRUCIATE LIGAMENT REPAIR     Left knee Dr. Edger House  . ANTERIOR CRUCIATE LIGAMENT REPAIR Right 06/02/2020   Procedure: RIGHT ANTERIOR CRUCIATE LIGAMENT (ACL) REPAIR WITH ALLOGRAFT;  Surgeon: Vickki Hearing, MD;  Location: [facility];  Service: Orthopedics;  Laterality: Right;  . HERNIA REPAIR    . KNEE ARTHROSCOPY WITH MEDIAL MENISECTOMY Right 06/02/2020   Procedure: RIGHT KNEE ARTHROSCOPY WITH MEDIAL MENISECTOMY;  Surgeon: Vickki Hearing, MD;  Location: [facility];  Service: Orthopedics;  Laterality: Right;    There were no vitals filed for this visit.   Subjective Assessment - 07/22/20 0920    Subjective Knee is feeling good today, no reports of pain.    Pertinent History ACL  tear    Currently in Pain? No/denies              [facility] PT Assessment - 07/22/20 0001      Assessment   Medical Diagnosis Rt ACL    Referring Provider (PT) Fuller Canada     Onset Date/Surgical Date 06/02/20    Next MD Visit 07/28/20      Precautions   Precautions Other (comment)    Precaution Comments ACL                         [facility] Adult PT Treatment/Exercise - 07/22/20 0001      Knee/Hip Exercises: Aerobic   Elliptical 2' fwd, 2' bkwd      Knee/Hip Exercises: Machines for Strengthening   Cybex Knee Extension 1PL bil 2X10    Cybex Knee Flexion 5.5 PL  2x 15    Cybex Leg Press 5PL 2X10 bil, Rt only 2PL 1X10 3PL 1x 10      Knee/Hip Exercises: Standing   Lateral Step Up Right;15 reps;Hand Hold: 0;Step Height: 6"    Forward Step Up 15 reps;Hand Hold: 0;Step Height: 6"    Step Down Right;2 sets;10 reps;Hand Hold: 1;Step Height: 6";Step Height: 4"    Functional Squat 15 reps    Functional Squat Limitations --    Wall Squat 5 reps  Wall Squat Limitations sits for 15" each, yellow ball punch forward    SLS with Vectors mini squat 10x 5"    Rebounder SLS yellow ball x 15     Other Standing Knee Exercises side step down blue line minisquat position    Other Standing Knee Exercises single leg quarter squats with UE assist 5 reps; 2 sets                    PT Short Term Goals - 07/04/20 1620      PT SHORT TERM GOAL #1   Title PT to be I in HEP to improve function.    Baseline 07/04/20: Reports intermittent compliance    Time 3    Period Weeks    Status On-going    Target Date 07/15/20      PT SHORT TERM GOAL #2   Title PT ROM at least -4-115    Baseline 07/04/20: 0-113 degrees    Time 3    Period Weeks    Status On-going      PT SHORT TERM GOAL #3   Title PT pain to be no greater than a 5/10 to allow pt to be able to walk for an hour at a time.    Baseline 07/04/20: Patient reports ability to ambulate for at least 1 hour with no more  than 5/10 pain    Time 3    Period Weeks    Status Achieved             PT Long Term Goals - 07/04/20 1627      PT LONG TERM GOAL #1   Title Pt to be I in an advance HEP to improve function.    Time 6    Period Weeks    Status On-going      PT LONG TERM GOAL #2   Title Pt ROM to be 0-130 to allow squatting for work activities.    Time 6    Period Weeks    Status On-going      PT LONG TERM GOAL #3   Title PT strength of LE to be at least 4+/5 to allow lifting and squatting for work activities    Time 6    Period Weeks    Status New      PT LONG TERM GOAL #4   Title Pt pain to be no greater than a 2/10 to allow pt to be able to be wb for 4 hours at a time without brace for work activities.    Time 6    Period Weeks    Status On-going                 Plan - 07/22/20 0941    Clinical Impression Statement Continued progressiong per Romeo Apple ACL protocol at week 7.  Increased focus with SLS activities as most difficulty with single leg squats, cueing for mechanics wiht noted quick fatigue with increased demand.  Able to increase step down height to 6in with cueing for eccentric control.  Pt tolerated well to session.  Able to increase single leg press to 3Pl with good control.  No reports of increased pain through session.    Personal Factors and Comorbidities Fitness;Time since onset of injury/illness/exacerbation    Examination-Activity Limitations Bend;Carry;Lift;Locomotion Level;Stairs;Stand;Squat;Sit    Examination-Participation Restrictions Cleaning;Yard Work;Other;Occupation    Stability/Clinical Decision Making Stable/Uncomplicated    Clinical Decision Making Low    Rehab Potential Good    PT  Frequency 3x / week    PT Duration 6 weeks    PT Treatment/Interventions Gait training;Stair training;Functional mobility training;Therapeutic activities;Therapeutic exercise;Manual techniques    PT Next Visit Plan Continue progressing according to [name] ACL  protocol. At week 8 begin walking lunges and sports cord.  Complete 1RM at week 12 and begin plyometrics if quad/ham are at 65%.    PT Home Exercise Plan ankle pumps, quad and ham set, heel slide, heelraises and minisquats 9/13 prone hamstring curl, SLR 07/06/20: bridge;9/27 sit to stand, step up , wall squat           Patient will benefit from skilled therapeutic intervention in order to improve the following deficits and impairments:  Abnormal gait, Decreased activity tolerance, Decreased range of motion, Difficulty walking, Decreased strength, Pain, Increased edema  Visit Diagnosis: Stiffness of right knee, not elsewhere classified  Difficulty in walking, not elsewhere classified  Muscle weakness (generalized)     Problem List Patient Active Problem List   Diagnosis Date Noted  . S/P ACL repair right 06/02/20 06/07/2020  . 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 Derangement of posterior horn of medial meniscus of right knee   . Abnormal LFTs 02/02/2019  . Acute myopericarditis 04/05/2016  . Chest pain 04/05/2016  . Elevated troponin 04/05/2016  . Right ACL tear 11/16/2015  . ACL (anterior cruciate ligament) rupture 05/05/2013  . 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 Intermittent explosive disorder 10/03/2011   Becky Sax, LPTA/CLT; CBIS [phone]  Juel Burrow 07/22/2020, 12:59 PM  [facility] [address] [place], [place], [address] Phone: [phone]   Fax:  [phone]  Name: TAISHAUN LEVELS MRN: [id number] Date of Birth: 01-31-1991

## 2020-07-25 ENCOUNTER — Ambulatory Visit (HOSPITAL_COMMUNITY): Payer: Medicaid Other | Admitting: Physical Therapy

## 2020-07-25 ENCOUNTER — Other Ambulatory Visit: Payer: Self-pay

## 2020-07-25 ENCOUNTER — Encounter (HOSPITAL_COMMUNITY): Payer: Self-pay | Admitting: Physical Therapy

## 2020-07-25 DIAGNOSIS — R262 Difficulty in walking, not elsewhere classified: Secondary | ICD-10-CM

## 2020-07-25 DIAGNOSIS — M25661 Stiffness of right knee, not elsewhere classified: Secondary | ICD-10-CM

## 2020-07-25 DIAGNOSIS — M6281 Muscle weakness (generalized): Secondary | ICD-10-CM

## 2020-07-25 NOTE — Therapy (Signed)
[place], [place], [address] Phone: [phone]   Fax:  [phone]  Physical Therapy Treatment/Progress note  Patient Details  Name: Joseph Blair MRN: [id number] Date of Birth: 12-13-1990 Referring Provider (PT): Arther Abbott    Encounter Date: 07/25/2020  Progress Note   Reporting Period 06/24/20 to 07/25/20   See note below for Objective Data and Assessment of Progress/Goals    PT End of Session - 07/25/20 0837    Visit Number 10    Number of Visits 18    Date for PT Re-Evaluation 08/05/20    Authorization Type medicaid authorized 12 more visits    Authorization Time Period 07/06/20-08/02/20    Authorization - Visit Number 7    Authorization - Number of Visits 12    Progress Note Due on Visit 20    PT Start Time [phone]    PT Stop Time 0900   Patient needed to go to another appointment   PT Time Calculation (min) 23 min    Activity Tolerance Patient tolerated treatment well    Behavior During Therapy [facility] for tasks assessed/performed           Past Medical History:  Diagnosis Date  . ADHD (attention deficit hyperactivity disorder)   . GERD (gastroesophageal reflux disease)   . Hypertension   . Pancreas disorder 2013   patient reports he was told he might have pancreatitis. No elevated lipase or imaging to support per EPIC review    Past Surgical History:  Procedure Laterality Date  . ANTERIOR CRUCIATE LIGAMENT REPAIR     Left knee Dr. Garfield Cornea  . ANTERIOR CRUCIATE LIGAMENT REPAIR Right 06/02/2020   Procedure: RIGHT ANTERIOR CRUCIATE LIGAMENT (ACL) REPAIR WITH ALLOGRAFT;  Surgeon: Carole Civil, MD;  Location: [facility];  Service: Orthopedics;  Laterality: Right;  . HERNIA REPAIR    . KNEE ARTHROSCOPY WITH MEDIAL MENISECTOMY Right 06/02/2020   Procedure: RIGHT KNEE ARTHROSCOPY WITH MEDIAL MENISECTOMY;  Surgeon: Carole Civil, MD;  Location: [facility];  Service: Orthopedics;   Laterality: Right;    There were no vitals filed for this visit.   Subjective Assessment - 07/25/20 0836    Subjective Patient states occasional pain where scar tissue is that is 2/10 at worst. No new complaints. He has to leave early for a dentist appointment today. His home exercises are going well. Patient states about 85% improvement with PT intervention. He remains limited with being cautious of his knee but he thinks its just in his mind.    Pertinent History ACL tear    How long can you walk comfortably? unrestricted    Currently in Pain? No/denies              [facility] PT Assessment - 07/25/20 0001      Assessment   Medical Diagnosis Rt ACL    Referring Provider (PT) Arther Abbott     Onset Date/Surgical Date 06/02/20    Next MD Visit 07/28/20      Precautions   Precautions Other (comment)    Precaution Comments ACL      Prior Function   Level of Independence Independent      Cognition   Overall Cognitive Status Within Functional Limits for tasks assessed      Observation/Other Assessments   Observations Ambulates without AD      AROM   Right Knee Extension 0    Right Knee Flexion 128      Strength  Right/Left Knee Right    Right Knee Flexion 5/5    Right Knee Extension 4+/5                         [facility] Adult PT Treatment/Exercise - 07/25/20 0001      Knee/Hip Exercises: Aerobic   Elliptical 2' fwd, 2' bkwd      Knee/Hip Exercises: Machines for Strengthening   Cybex Knee Extension 1PL bil 2X10    Cybex Knee Flexion 5.5 PL  3x 15    Cybex Leg Press 6PL 2X10 bil, Rt only 3PL 2x 10                  PT Education - 07/25/20 0837    Education Details Patient educated on HEP, exercise mechanics    Person(s) Educated Patient    Methods Explanation;Demonstration    Comprehension Verbalized understanding;Returned demonstration            PT Short Term Goals - 07/25/20 0846      PT SHORT TERM GOAL #1   Title PT to be I in  HEP to improve function.    Baseline 07/04/20: Reports intermittent compliance    Time 3    Period Weeks    Status Achieved    Target Date 07/15/20      PT SHORT TERM GOAL #2   Title PT ROM at least -4-115    Baseline 07/04/20: 0-113 degrees 10/11 0-128    Time 3    Period Weeks    Status On-going      PT SHORT TERM GOAL #3   Title PT pain to be no greater than a 5/10 to allow pt to be able to walk for an hour at a time.    Baseline 07/04/20: Patient reports ability to ambulate for at least 1 hour with no more than 5/10 pain    Time 3    Period Weeks    Status Achieved             PT Long Term Goals - 07/25/20 0847      PT LONG TERM GOAL #1   Title Pt to be I in an advance HEP to improve function.    Time 6    Period Weeks    Status On-going      PT LONG TERM GOAL #2   Title Pt ROM to be 0-130 to allow squatting for work activities.    Time 6    Period Weeks    Status On-going      PT LONG TERM GOAL #3   Title PT strength of LE to be at least 4+/5 to allow lifting and squatting for work activities    Time 6    Period Weeks    Status Achieved      PT LONG TERM GOAL #4   Title Pt pain to be no greater than a 2/10 to allow pt to be able to be wb for 4 hours at a time without brace for work activities.    Baseline 10/11 at least 4 hours, on his feet most of the day    Time 6    Period Weeks    Status Achieved                 Plan - 07/25/20 0837    Clinical Impression Statement Patient hast met 2/3 short term goals and 2/4 long term goals with ability to complete HEP, decreased  symptoms, and improved gait, ROM and strength. Patient remains limited by end range flexion and strength as well as confidence in his knee. Patient showing improving strength today and is able to complete leg press with increased resistance and hamstring curls with increased reps. He also shows good eccentric control on leg press today. Session shortened today due to patient needing to  go to another appointment. Patient will continue to benefit from skilled physical therapy in order to reduce impairment and improve function.    Personal Factors and Comorbidities Fitness;Time since onset of injury/illness/exacerbation    Examination-Activity Limitations Bend;Carry;Lift;Locomotion Level;Stairs;Stand;Squat;Sit    Examination-Participation Restrictions Cleaning;Yard Work;Other;Occupation    Stability/Clinical Decision Making Stable/Uncomplicated    Rehab Potential Good    PT Frequency 3x / week    PT Duration 6 weeks    PT Treatment/Interventions Gait training;Stair training;Functional mobility training;Therapeutic activities;Therapeutic exercise;Manual techniques    PT Next Visit Plan Continue progressing according to Dr. Ruthe Mannan ACL protocol. At week 8 begin walking lunges and sports cord.  Complete 1RM at week 12 and begin plyometrics if quad/ham are at 65%.    PT Home Exercise Plan ankle pumps, quad and ham set, heel slide, heelraises and minisquats 9/13 prone hamstring curl, SLR 07/06/20: bridge;9/27 sit to stand, step up , wall squat           Patient will benefit from skilled therapeutic intervention in order to improve the following deficits and impairments:  Abnormal gait, Decreased activity tolerance, Decreased range of motion, Difficulty walking, Decreased strength, Pain, Increased edema  Visit Diagnosis: Stiffness of right knee, not elsewhere classified  Difficulty in walking, not elsewhere classified  Muscle weakness (generalized)     Problem List Patient Active Problem List   Diagnosis Date Noted  . S/P ACL repair right 06/02/20 06/07/2020  . 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 Derangement of posterior horn of medial meniscus of right knee   . Abnormal LFTs 02/02/2019  . Acute myopericarditis 04/05/2016  . Chest pain 04/05/2016  . Elevated troponin 04/05/2016  . Right ACL tear 11/16/2015  . ACL (anterior cruciate  ligament) rupture 05/05/2013  . 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 Intermittent explosive disorder 10/03/2011    9:02 AM, 07/25/20 Mearl Latin PT, DPT Physical Therapist at [place], [place], [address] Phone: [phone]   Fax:  [phone]  Name: LEKEITH WULF MRN: [id number] Date of Birth: June 12, 1991

## 2020-07-27 ENCOUNTER — Encounter (HOSPITAL_COMMUNITY): Payer: Self-pay

## 2020-07-27 ENCOUNTER — Other Ambulatory Visit: Payer: Self-pay

## 2020-07-27 ENCOUNTER — Ambulatory Visit (HOSPITAL_COMMUNITY): Payer: Medicaid Other

## 2020-07-27 DIAGNOSIS — R262 Difficulty in walking, not elsewhere classified: Secondary | ICD-10-CM

## 2020-07-27 DIAGNOSIS — M6281 Muscle weakness (generalized): Secondary | ICD-10-CM

## 2020-07-27 DIAGNOSIS — M25661 Stiffness of right knee, not elsewhere classified: Secondary | ICD-10-CM

## 2020-07-27 NOTE — Therapy (Signed)
[facility] [address] [place], [place], [address] Phone: [phone]   Fax:  [phone]  Physical Therapy Treatment  Patient Details  Name: Joseph Blair MRN: [id number] Date of Birth: 06-Jul-1991 Referring Provider (PT): Fuller Canada    Encounter Date: 07/27/2020   PT End of Session - 07/27/20 1540    Visit Number 11    Number of Visits 18    Date for PT Re-Evaluation 08/05/20    Authorization Type medicaid authorized 12 more visits    Authorization Time Period 07/06/20-08/02/20    Authorization - Visit Number 8    Authorization - Number of Visits 12    Progress Note Due on Visit 20    PT Start Time 1536   Late arrival, 4' on elliptical not included with charges   PT Stop Time 1615    PT Time Calculation (min) 39 min    Activity Tolerance Patient tolerated treatment well    Behavior During Therapy [facility] for tasks assessed/performed           Past Medical History:  Diagnosis Date  . ADHD (attention deficit hyperactivity disorder)   . GERD (gastroesophageal reflux disease)   . Hypertension   . Pancreas disorder 2013   patient reports he was told he might have pancreatitis. No elevated lipase or imaging to support per EPIC review    Past Surgical History:  Procedure Laterality Date  . ANTERIOR CRUCIATE LIGAMENT REPAIR     Left knee Dr. Edger House  . ANTERIOR CRUCIATE LIGAMENT REPAIR Right 06/02/2020   Procedure: RIGHT ANTERIOR CRUCIATE LIGAMENT (ACL) REPAIR WITH ALLOGRAFT;  Surgeon: Vickki Hearing, MD;  Location: [facility];  Service: Orthopedics;  Laterality: Right;  . HERNIA REPAIR    . KNEE ARTHROSCOPY WITH MEDIAL MENISECTOMY Right 06/02/2020   Procedure: RIGHT KNEE ARTHROSCOPY WITH MEDIAL MENISECTOMY;  Surgeon: Vickki Hearing, MD;  Location: [facility];  Service: Orthopedics;  Laterality: Right;    There were no vitals filed for this visit.   Subjective Assessment - 07/27/20 1538    Subjective Pt stated he  can tell his legs are getting stronger, reports compliance with HEP daily.    Pertinent History ACL tear    Currently in Pain? No/denies              [facility] PT Assessment - 07/27/20 0001      Assessment   Medical Diagnosis Rt ACL    Referring Provider (PT) Fuller Canada     Onset Date/Surgical Date 06/02/20    Next MD Visit Hilda Lias 07/28/20      Precautions   Precautions Other (comment)    Precaution Comments ACL                         [facility] Adult PT Treatment/Exercise - 07/27/20 0001      Knee/Hip Exercises: Aerobic   Elliptical 2' fwd, 2' bkwd      Knee/Hip Exercises: Machines for Strengthening   Cybex Knee Extension 1PL bil 2X10    Cybex Knee Flexion 5.5 PL  3x 15    Cybex Leg Press 6PL 2X10 bil, Rt only 3PL 2x 10    Other Machine Power tower 25 degrees, 2 sets 1st set BLE squat; 2nd set single leg squat      Knee/Hip Exercises: Standing   Heel Raises 2 sets;10 reps    Heel Raises Limitations squat then heel raise 5" holds each no HHA  Lateral Step Up Right;15 reps;Hand Hold: 0;Step Height: 6"    Forward Step Up 15 reps;Hand Hold: 0;Step Height: 6"    Forward Step Up Limitations knee drive    Functional Squat 2 sets;10 reps    Functional Squat Limitations squat then heel raise 5" holds each no HHA    Lunge Walking - Round Trips 1 RT cueing for mechanics    Walking with Sports Cord fast walking forward and retro                  PT Education - 07/27/20 1558    Education Details Encouraged pt to wear tennis shoes to PT sessions    Person(s) Educated Patient    Methods Explanation    Comprehension Verbalized understanding            PT Short Term Goals - 07/25/20 0846      PT SHORT TERM GOAL #1   Title PT to be I in HEP to improve function.    Baseline 07/04/20: Reports intermittent compliance    Time 3    Period Weeks    Status Achieved    Target Date 07/15/20      PT SHORT TERM GOAL #2   Title PT ROM at least -4-115     Baseline 07/04/20: 0-113 degrees 10/11 0-128    Time 3    Period Weeks    Status On-going      PT SHORT TERM GOAL #3   Title PT pain to be no greater than a 5/10 to allow pt to be able to walk for an hour at a time.    Baseline 07/04/20: Patient reports ability to ambulate for at least 1 hour with no more than 5/10 pain    Time 3    Period Weeks    Status Achieved             PT Long Term Goals - 07/25/20 0847      PT LONG TERM GOAL #1   Title Pt to be I in an advance HEP to improve function.    Time 6    Period Weeks    Status On-going      PT LONG TERM GOAL #2   Title Pt ROM to be 0-130 to allow squatting for work activities.    Time 6    Period Weeks    Status On-going      PT LONG TERM GOAL #3   Title PT strength of LE to be at least 4+/5 to allow lifting and squatting for work activities    Time 6    Period Weeks    Status Achieved      PT LONG TERM GOAL #4   Title Pt pain to be no greater than a 2/10 to allow pt to be able to be wb for 4 hours at a time without brace for work activities.    Baseline 10/11 at least 4 hours, on his feet most of the day    Time 6    Period Weeks    Status Achieved                 Plan - 07/27/20 1700    Clinical Impression Statement Continued with [name]'s ACL protocol for 8 week post-op that will be tomorrow.  Added lunge walking and sports cord as progression, pt required multimodal cueing to improve form wiht lunges that did improve wiht reps.  Pt continues to have main difficulty with  SLS squats, added powertower at 25 degrees with cueing for mechanics that improve form and ease to complete standing.  No reports of pain through session, was limited by fatigue.  Will continue to require cueing for single leg squats and lunges to improve form.    Personal Factors and Comorbidities Fitness;Time since onset of injury/illness/exacerbation    Examination-Activity Limitations Bend;Carry;Lift;Locomotion  Level;Stairs;Stand;Squat;Sit    Examination-Participation Restrictions Cleaning;Yard Work;Other;Occupation    Stability/Clinical Decision Making Stable/Uncomplicated    Clinical Decision Making Low    Rehab Potential Good    PT Frequency 3x / week    PT Duration 6 weeks    PT Treatment/Interventions Gait training;Stair training;Functional mobility training;Therapeutic activities;Therapeutic exercise;Manual techniques    PT Next Visit Plan F/U wiht MD apt on 07/28/20.  Continue progressing according to [name] ACL protocol. At week 8 begin walking lunges and sports cord.  Complete 1RM at week 12 and begin plyometrics if quad/ham are at 65%.    PT Home Exercise Plan ankle pumps, quad and ham set, heel slide, heelraises and minisquats 9/13 prone hamstring curl, SLR 07/06/20: bridge;9/27 sit to stand, step up , wall squat           Patient will benefit from skilled therapeutic intervention in order to improve the following deficits and impairments:  Abnormal gait, Decreased activity tolerance, Decreased range of motion, Difficulty walking, Decreased strength, Pain, Increased edema  Visit Diagnosis: Muscle weakness (generalized)  Difficulty in walking, not elsewhere classified  Stiffness of right knee, not elsewhere classified     Problem List Patient Active Problem List   Diagnosis Date Noted  . S/P ACL repair right 06/02/20 06/07/2020  . 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 Derangement of posterior horn of medial meniscus of right knee   . Abnormal LFTs 02/02/2019  . Acute myopericarditis 04/05/2016  . Chest pain 04/05/2016  . Elevated troponin 04/05/2016  . Right ACL tear 11/16/2015  . ACL (anterior cruciate ligament) rupture 05/05/2013  . 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 Intermittent explosive disorder 10/03/2011   Becky Sax, LPTA/CLT; CBIS [phone]  Juel Burrow 07/27/2020,  5:30 PM  [place] [facility] [address] [place], [place], [address] Phone: [phone]   Fax:  [phone]  Name: Joseph Blair MRN: [id number] Date of Birth: 07/31/1991

## 2020-07-28 ENCOUNTER — Ambulatory Visit (INDEPENDENT_AMBULATORY_CARE_PROVIDER_SITE_OTHER): Payer: Medicaid Other | Admitting: Orthopedic Surgery

## 2020-07-28 ENCOUNTER — Encounter: Payer: Self-pay | Admitting: Orthopedic Surgery

## 2020-07-28 DIAGNOSIS — Z9889 Other specified postprocedural states: Secondary | ICD-10-CM

## 2020-07-28 MED ORDER — HYDROCODONE-ACETAMINOPHEN 5-325 MG PO TABS
1.0000 | ORAL_TABLET | Freq: Four times a day (QID) | ORAL | 0 refills | Status: AC | PRN
Start: 2020-07-28 — End: 2020-08-02

## 2020-07-28 NOTE — Progress Notes (Signed)
Chief Complaint  Patient presents with  . Routine Post Op    06/02/20 right ACL repair, had fall 1 month ago improving now but had increased pain     He fell coming out of the shower  Right knee no effusion FROM stable lachman  Rec: exercises and stop norco   Fu 6 weeks

## 2020-07-28 NOTE — Patient Instructions (Signed)
Take naproxen and tylenol for pain   Continue exercises

## 2020-07-29 ENCOUNTER — Telehealth (HOSPITAL_COMMUNITY): Payer: Self-pay | Admitting: Physical Therapy

## 2020-07-29 ENCOUNTER — Ambulatory Visit (HOSPITAL_COMMUNITY): Payer: Medicaid Other | Admitting: Physical Therapy

## 2020-07-29 NOTE — Telephone Encounter (Signed)
pt called to cx this appt due to death in the family

## 2020-08-01 ENCOUNTER — Ambulatory Visit (HOSPITAL_COMMUNITY): Payer: Medicaid Other

## 2020-08-01 ENCOUNTER — Encounter (HOSPITAL_COMMUNITY): Payer: Self-pay

## 2020-08-01 ENCOUNTER — Other Ambulatory Visit: Payer: Self-pay

## 2020-08-01 DIAGNOSIS — M25661 Stiffness of right knee, not elsewhere classified: Secondary | ICD-10-CM | POA: Diagnosis not present

## 2020-08-01 DIAGNOSIS — M6281 Muscle weakness (generalized): Secondary | ICD-10-CM

## 2020-08-01 DIAGNOSIS — R262 Difficulty in walking, not elsewhere classified: Secondary | ICD-10-CM

## 2020-08-01 NOTE — Therapy (Signed)
[facility] [address] [place], [place], [address] Phone: [phone]   Fax:  [phone]  Physical Therapy Treatment  Patient Details  Name: Joseph Blair MRN: [id number] Date of Birth: November 22, 1990 Referring Provider (PT): Fuller Canada    Encounter Date: 08/01/2020   PT End of Session - 08/01/20 0945    Visit Number 12    Number of Visits 18    Date for PT Re-Evaluation 08/05/20    Authorization Type medicaid authorized 12 more visits    Authorization Time Period 07/06/20-08/02/20    Authorization - Visit Number 8    Authorization - Number of Visits 12    Progress Note Due on Visit 20    PT Start Time 0946    PT Stop Time 1024    PT Time Calculation (min) 38 min    Activity Tolerance Patient tolerated treatment well    Behavior During Therapy [facility] for tasks assessed/performed           Past Medical History:  Diagnosis Date  . ADHD (attention deficit hyperactivity disorder)   . GERD (gastroesophageal reflux disease)   . Hypertension   . Pancreas disorder 2013   patient reports he was told he might have pancreatitis. No elevated lipase or imaging to support per EPIC review    Past Surgical History:  Procedure Laterality Date  . ANTERIOR CRUCIATE LIGAMENT REPAIR     Left knee Dr. Edger House  . ANTERIOR CRUCIATE LIGAMENT REPAIR Right 06/02/2020   Procedure: RIGHT ANTERIOR CRUCIATE LIGAMENT (ACL) REPAIR WITH ALLOGRAFT;  Surgeon: Vickki Hearing, MD;  Location: [facility];  Service: Orthopedics;  Laterality: Right;  . HERNIA REPAIR    . KNEE ARTHROSCOPY WITH MEDIAL MENISECTOMY Right 06/02/2020   Procedure: RIGHT KNEE ARTHROSCOPY WITH MEDIAL MENISECTOMY;  Surgeon: Vickki Hearing, MD;  Location: [facility];  Service: Orthopedics;  Laterality: Right;    There were no vitals filed for this visit.   Subjective Assessment - 08/01/20 0945    Subjective Patient reports he still feels he is improving. Continues to do HEP.  Running late today getting his children to school. Reports appt with [name] went well and he was progressing like MD thought he should be.    Pertinent History ACL tear    Currently in Pain? No/denies           [facility] Adult PT Treatment/Exercise - 08/01/20 0001      Knee/Hip Exercises: Aerobic   Elliptical 2' fwd, 2' bkwd      Knee/Hip Exercises: Machines for Strengthening   Cybex Leg Press 6PL 3X10 bil, Rt only 3PL 3x 10    Other Machine Power tower 25 degrees, 2x10 right leg squat      Knee/Hip Exercises: Standing   Lateral Step Up Right;15 reps;Hand Hold: 0;Step Height: 6"    Forward Step Up 15 reps;Hand Hold: 0;Step Height: 6"    Forward Step Up Limitations knee drive    Step Down Right;1 set;15 reps;Step Height: 4"    Lunge Walking - Round Trips 1 RT cueing for mechanics    Walking with Sports Cord fast walking forward and retro blue Draper's Strength band 2 RT            PT Education - 08/01/20 1203    Education Details Dsicussed purpose and technique of interventions througout session. Knee alignment during closed kinetic chain exercises.    Person(s) Educated Patient    Methods Explanation    Comprehension Verbalized  understanding;Need further instruction            PT Short Term Goals - 08/01/20 1021      PT SHORT TERM GOAL #1   Title PT to be I in HEP to improve function.    Baseline 07/04/20: Reports intermittent compliance    Time 3    Period Weeks    Status Achieved    Target Date 07/15/20      PT SHORT TERM GOAL #2   Title PT ROM at least -4-115    Baseline 07/04/20: 0-113 degrees 10/11 0-128    Time 3    Period Weeks    Status On-going      PT SHORT TERM GOAL #3   Title PT pain to be no greater than a 5/10 to allow pt to be able to walk for an hour at a time.    Baseline 07/04/20: Patient reports ability to ambulate for at least 1 hour with no more than 5/10 pain    Time 3    Period Weeks    Status Achieved             PT Long Term  Goals - 08/01/20 1021      PT LONG TERM GOAL #1   Title Pt to be I in an advance HEP to improve function.    Time 6    Period Weeks    Status On-going      PT LONG TERM GOAL #2   Title Pt ROM to be 0-130 to allow squatting for work activities.    Time 6    Period Weeks    Status On-going      PT LONG TERM GOAL #3   Title PT strength of LE to be at least 4+/5 to allow lifting and squatting for work activities    Time 6    Period Weeks    Status Achieved      PT LONG TERM GOAL #4   Title Pt pain to be no greater than a 2/10 to allow pt to be able to be wb for 4 hours at a time without brace for work activities.    Baseline 10/11 at least 4 hours, on his feet most of the day    Time 6    Period Weeks    Status Achieved             Plan - 08/01/20 1020    Clinical Impression Statement Contnued with [name]'s protocol. Patient 12 minutes late to appointment today. Session focused on right lower extremity strengthening and cues for proper form and hip and knee alignment with closed kinetic chain exercises. Performed step downs on 4" step with right lower extremity. Continued with power tower at 25 degrees with cueing for mechanics to improve form and ease to complete single leg squats in standing. No reports of pain throughout session, was limited by fatigue and did report muscle burn with completion of exercises. Will continue to require cueing for single leg squats and lunges to improve form.    Personal Factors and Comorbidities Fitness;Time since onset of injury/illness/exacerbation    Examination-Activity Limitations Bend;Carry;Lift;Locomotion Level;Stairs;Stand;Squat;Sit    Examination-Participation Restrictions Cleaning;Yard Work;Other;Occupation    Stability/Clinical Decision Making Stable/Uncomplicated    Rehab Potential Good    PT Frequency 3x / week    PT Duration 6 weeks    PT Treatment/Interventions Gait training;Stair training;Functional mobility  training;Therapeutic activities;Therapeutic exercise;Manual techniques    PT Next Visit Plan F/U  wiht MD apt on 07/28/20.  Continue progressing according to [name] ACL protocol. At week 8 begin walking lunges and sports cord.  Complete 1RM at week 12 and begin plyometrics if quad/ham are at 65%.    PT Home Exercise Plan ankle pumps, quad and ham set, heel slide, heelraises and minisquats 9/13 prone hamstring curl, SLR 07/06/20: bridge;9/27 sit to stand, step up , wall squat           Patient will benefit from skilled therapeutic intervention in order to improve the following deficits and impairments:  Abnormal gait, Decreased activity tolerance, Decreased range of motion, Difficulty walking, Decreased strength, Pain, Increased edema  Visit Diagnosis: Muscle weakness (generalized)  Difficulty in walking, not elsewhere classified  Stiffness of right knee, not elsewhere classified     Problem List Patient Active Problem List   Diagnosis Date Noted  . S/P ACL repair right 06/02/20 06/07/2020  . 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 Derangement of posterior horn of medial meniscus of right knee   . Abnormal LFTs 02/02/2019  . Acute myopericarditis 04/05/2016  . Chest pain 04/05/2016  . Elevated troponin 04/05/2016  . Right ACL tear 11/16/2015  . ACL (anterior cruciate ligament) rupture 05/05/2013  . 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 Intermittent explosive disorder 10/03/2011    Katina Dung. Hartnett-Rands, MS, PT [facility] #[id number] 08/01/2020, 12:07 PM  [place] [address] [place], [place], [address] Phone: [phone]   Fax:  [phone]  Name: Joseph Blair MRN: [id number] Date of Birth: Jun 19, 1991

## 2020-08-03 ENCOUNTER — Ambulatory Visit (HOSPITAL_COMMUNITY): Payer: Medicaid Other

## 2020-08-04 ENCOUNTER — Ambulatory Visit (HOSPITAL_COMMUNITY): Payer: Medicaid Other | Admitting: Physical Therapy

## 2020-08-04 ENCOUNTER — Other Ambulatory Visit: Payer: Self-pay

## 2020-08-04 ENCOUNTER — Encounter (HOSPITAL_COMMUNITY): Payer: Self-pay | Admitting: Physical Therapy

## 2020-08-04 DIAGNOSIS — M6281 Muscle weakness (generalized): Secondary | ICD-10-CM

## 2020-08-04 DIAGNOSIS — M25661 Stiffness of right knee, not elsewhere classified: Secondary | ICD-10-CM

## 2020-08-04 DIAGNOSIS — R262 Difficulty in walking, not elsewhere classified: Secondary | ICD-10-CM

## 2020-08-04 NOTE — Therapy (Signed)
[place] [facility] [address] [place], [place], [address] Phone: [phone]   Fax:  [phone]  Physical Therapy Treatment  Patient Details  Name: Joseph Blair MRN: [id number] Date of Birth: 10/24/1990 Referring Provider (PT): Fuller Canada   ROM 0-130;  Quadricep strength:  4- Encounter Date: 08/04/2020   PT End of Session - 08/04/20 1347    Visit Number 13    Number of Visits 25    Date for PT Re-Evaluation 09/03/20    Authorization Type medicaid authorized 12 more visits; requesting 12 more visits    Authorization Time Period 07/06/20-08/02/20    Authorization - Visit Number 9    Authorization - Number of Visits 9   time limit   Progress Note Due on Visit 18    PT Start Time 1315    PT Stop Time 1345    PT Time Calculation (min) 30 min    Activity Tolerance Patient tolerated treatment well    Behavior During Therapy [facility] for tasks assessed/performed           Past Medical History:  Diagnosis Date  . ADHD (attention deficit hyperactivity disorder)   . GERD (gastroesophageal reflux disease)   . Hypertension   . Pancreas disorder 2013   patient reports he was told he might have pancreatitis. No elevated lipase or imaging to support per EPIC review    Past Surgical History:  Procedure Laterality Date  . ANTERIOR CRUCIATE LIGAMENT REPAIR     Left knee Dr. Edger House  . ANTERIOR CRUCIATE LIGAMENT REPAIR Right 06/02/2020   Procedure: RIGHT ANTERIOR CRUCIATE LIGAMENT (ACL) REPAIR WITH ALLOGRAFT;  Surgeon: Vickki Hearing, MD;  Location: [facility];  Service: Orthopedics;  Laterality: Right;  . HERNIA REPAIR    . KNEE ARTHROSCOPY WITH MEDIAL MENISECTOMY Right 06/02/2020   Procedure: RIGHT KNEE ARTHROSCOPY WITH MEDIAL MENISECTOMY;  Surgeon: Vickki Hearing, MD;  Location: [facility];  Service: Orthopedics;  Laterality: Right;    There were no vitals filed for this visit.   Subjective Assessment - 08/04/20 1315    Subjective  Pt states that he is doing well.  He is at his normal activity level and feels that he is walking well    Pertinent History ACL tear    Limitations Standing;Walking;House hold activities    How long can you stand comfortably? Pt is able to stand as long as he needs to without a brace was. 3-5 minutes with brace    How long can you walk comfortably? PT is able to walk as long as he needs to without the brace was.  with brace on 20  minutes occasionally uses crutch    Currently in Pain? No/denies              [facility] PT Assessment - 08/04/20 0001      Assessment   Medical Diagnosis Rt ACL    Referring Provider (PT) Fuller Canada     Onset Date/Surgical Date 06/02/20    Next MD Visit november 29th       Precautions   Precautions Other (comment)    Precaution Comments ACL      Home Environment   Living Environment Private residence    Home Access Stairs to enter    Entrance Stairs-Number of Steps 7    Entrance Stairs-Rails Right      Prior Function   Level of Independence Independent    Vocation Requirements plumber       Cognition  Overall Cognitive Status Within Functional Limits for tasks assessed      Functional Tests   Functional tests Single leg stance;Sit to Stand      Single Leg Stance   Comments B 60"       Sit to Stand   Comments 16 in 30 seconds       AROM   Right Knee Extension 0   initial 3   Right Knee Flexion 130   initial 83      Strength   Right/Left Hip Right    Right Hip Flexion 4+/5   was 3-   Right Hip Extension 5/5    Right Hip ABduction 5/5    Right/Left Knee Right;Left    Right Knee Flexion 5/5    Right Knee Extension 4-/5    Left Knee Extension 5/5                                   PT Short Term Goals - 08/04/20 1334      PT SHORT TERM GOAL #1   Title PT to be I in HEP to improve function.    Baseline 07/04/20: Reports intermittent compliance    Time 3    Period Weeks    Status Achieved    Target Date  07/15/20      PT SHORT TERM GOAL #2   Title PT ROM at least -4-115    Baseline 07/04/20: 0-113 degrees 10/11 0-128    Time 3    Period Weeks    Status Achieved      PT SHORT TERM GOAL #3   Title PT pain to be no greater than a 5/10 to allow pt to be able to walk for an hour at a time.    Baseline 07/04/20: Patient reports ability to ambulate for at least 1 hour with no more than 5/10 pain    Time 3    Period Weeks    Status Achieved             PT Long Term Goals - 08/04/20 1335      PT LONG TERM GOAL #1   Title Pt to be I in an advance HEP to improve function.    Time 6    Period Weeks    Status Achieved      PT LONG TERM GOAL #2   Title Pt ROM to be 0-130 to allow squatting for work activities.    Time 6    Period Weeks    Status Achieved      PT LONG TERM GOAL #3   Title PT strength of LE to be at least 4+/5 to allow lifting and squatting for work activities    Time 6    Period Weeks    Status On-going      PT LONG TERM GOAL #4   Title Pt pain to be no greater than a 2/10 to allow pt to be able to be wb for 4 hours at a time without brace for work activities.    Baseline 10/11 at least 4 hours, on his feet most of the day    Time 6    Period Weeks    Status Achieved                 Plan - 08/04/20 1349    Clinical Impression Statement Pt medicaid time limit was 08/02/2020.  PT reevaluated  with significant improvement in all aspects, however, pt quadricep is still significantly decreased.  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 Pt is able to progress to lunges.  PT will continue to benefit from skilled PT to improve quadricep strength to ensure pt does not reinjure while playing with his children.    Personal Factors and Comorbidities Fitness;Time since onset of injury/illness/exacerbation    Examination-Activity Limitations Bend;Carry;Lift;Locomotion Level;Stairs;Stand;Squat;Sit     Examination-Participation Restrictions Cleaning;Yard Work;Other;Occupation    Stability/Clinical Decision Making Stable/Uncomplicated    Rehab Potential Good    PT Frequency 3x / week    PT Duration 6 weeks    PT Treatment/Interventions Gait training;Stair training;Functional mobility training;Therapeutic activities;Therapeutic exercise;Manual techniques    PT Next Visit Plan F/U wiht MD apt on 07/28/20.  Continue progressing according to [name] ACL protocol. At week 8 begin walking lunges and sports cord.  Complete 1RM at week 12 and begin plyometrics if quad/ham are at 65%.    PT Home Exercise Plan ankle pumps, quad and ham set, heel slide, heelraises and minisquats 9/13 prone hamstring curl, SLR 07/06/20: bridge;9/27 sit to stand, step up , wall squat           Patient will benefit from skilled therapeutic intervention in order to improve the following deficits and impairments:  Abnormal gait, Decreased activity tolerance, Decreased range of motion, Difficulty walking, Decreased strength, Pain, Increased edema  Visit Diagnosis: Muscle weakness (generalized)  Difficulty in walking, not elsewhere classified  Stiffness of right knee, not elsewhere classified     Problem List Patient Active Problem List   Diagnosis Date Noted  . S/P ACL repair right 06/02/20 06/07/2020  . 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 Derangement of posterior horn of medial meniscus of right knee   . Abnormal LFTs 02/02/2019  . Acute myopericarditis 04/05/2016  . Chest pain 04/05/2016  . Elevated troponin 04/05/2016  . Right ACL tear 11/16/2015  . ACL (anterior cruciate ligament) rupture 05/05/2013  . 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 Intermittent explosive disorder 10/03/2011   Virgina Organ, PT CLT [phone] 08/04/2020, 1:55 PM  [place] [facility] [address] [place],  [place], [address] Phone: [phone]   Fax:  [phone]  Name: Joseph Blair MRN: [id number] Date of Birth: [birth year]

## 2020-08-05 ENCOUNTER — Ambulatory Visit (HOSPITAL_COMMUNITY): Payer: Medicaid Other

## 2020-08-09 ENCOUNTER — Telehealth (HOSPITAL_COMMUNITY): Payer: Self-pay | Admitting: Physical Therapy

## 2020-08-09 ENCOUNTER — Ambulatory Visit (HOSPITAL_COMMUNITY): Payer: Medicaid Other | Admitting: Physical Therapy

## 2020-08-09 NOTE — Telephone Encounter (Signed)
Lack of Transportation cx today 08/09/20

## 2020-08-10 ENCOUNTER — Encounter (HOSPITAL_COMMUNITY): Payer: Self-pay | Admitting: Physical Therapy

## 2020-08-10 ENCOUNTER — Other Ambulatory Visit: Payer: Self-pay

## 2020-08-10 ENCOUNTER — Ambulatory Visit (HOSPITAL_COMMUNITY): Payer: Medicaid Other | Admitting: Physical Therapy

## 2020-08-10 DIAGNOSIS — M6281 Muscle weakness (generalized): Secondary | ICD-10-CM

## 2020-08-10 DIAGNOSIS — M25661 Stiffness of right knee, not elsewhere classified: Secondary | ICD-10-CM | POA: Diagnosis not present

## 2020-08-10 DIAGNOSIS — R262 Difficulty in walking, not elsewhere classified: Secondary | ICD-10-CM

## 2020-08-10 NOTE — Therapy (Signed)
[facility] [address] [place], [place], [address] Phone: [phone]   Fax:  [phone]  Physical Therapy Treatment  Patient Details  Name: Joseph Blair MRN: [id number] Date of Birth: [birth year] Referring Provider (PT): Fuller Canada    Encounter Date: 08/10/2020   PT End of Session - 08/10/20 1402    Visit Number 14    Number of Visits 25    Date for PT Re-Evaluation 09/03/20    Authorization Type medicaid    Authorization Time Period 08/08/20-09/04/20    Authorization - Visit Number 1    Authorization - Number of Visits 12    Progress Note Due on Visit 18    PT Start Time 1408    PT Stop Time 1446    PT Time Calculation (min) 38 min    Activity Tolerance Patient tolerated treatment well    Behavior During Therapy [facility] for tasks assessed/performed           Past Medical History:  Diagnosis Date  . ADHD (attention deficit hyperactivity disorder)   . GERD (gastroesophageal reflux disease)   . Hypertension   . Pancreas disorder 2013   patient reports he was told he might have pancreatitis. No elevated lipase or imaging to support per EPIC review    Past Surgical History:  Procedure Laterality Date  . ANTERIOR CRUCIATE LIGAMENT REPAIR     Left knee Dr. Edger House  . ANTERIOR CRUCIATE LIGAMENT REPAIR Right 06/02/2020   Procedure: RIGHT ANTERIOR CRUCIATE LIGAMENT (ACL) REPAIR WITH ALLOGRAFT;  Surgeon: Vickki Hearing, MD;  Location: [facility];  Service: Orthopedics;  Laterality: Right;  . HERNIA REPAIR    . KNEE ARTHROSCOPY WITH MEDIAL MENISECTOMY Right 06/02/2020   Procedure: RIGHT KNEE ARTHROSCOPY WITH MEDIAL MENISECTOMY;  Surgeon: Vickki Hearing, MD;  Location: [facility];  Service: Orthopedics;  Laterality: Right;    There were no vitals filed for this visit.   Subjective Assessment - 08/10/20 1409    Subjective Patient states his knee is feeling alright and exercises at home are going well.    Pertinent  History ACL tear    Limitations Standing;Walking;House hold activities    How long can you stand comfortably? Pt is able to stand as long as he needs to without a brace was. 3-5 minutes with brace    How long can you walk comfortably? PT is able to walk as long as he needs to without the brace was.  with brace on 20  minutes occasionally uses crutch    Currently in Pain? No/denies                             [facility] Adult PT Treatment/Exercise - 08/10/20 0001      Knee/Hip Exercises: Aerobic   Elliptical 2' fwd, 2' bkwd      Knee/Hip Exercises: Machines for Strengthening   Cybex Leg Press 6PL 3X10 bil, Rt only 3PL 3x 10      Knee/Hip Exercises: Standing   Forward Lunges Both;2 sets;10 reps    Lunge Walking - Round Trips 1 RT cueing for mechanics    SLS with Vectors with mini squat 5x each direction RLE    Other Standing Knee Exercises walking lunges 4x 15 feet    Other Standing Knee Exercises hip slide outs 2x8 on RLE; 1x8 LLE                 PT Education -  08/10/20 1402    Education Details Patient educated on HEP, exercise mechanics    Person(s) Educated Patient    Methods Explanation;Demonstration    Comprehension Verbalized understanding;Returned demonstration            PT Short Term Goals - 08/04/20 1334      PT SHORT TERM GOAL #1   Title PT to be I in HEP to improve function.    Baseline 07/04/20: Reports intermittent compliance    Time 3    Period Weeks    Status Achieved    Target Date 07/15/20      PT SHORT TERM GOAL #2   Title PT ROM at least -4-115    Baseline 07/04/20: 0-113 degrees 10/11 0-128    Time 3    Period Weeks    Status Achieved      PT SHORT TERM GOAL #3   Title PT pain to be no greater than a 5/10 to allow pt to be able to walk for an hour at a time.    Baseline 07/04/20: Patient reports ability to ambulate for at least 1 hour with no more than 5/10 pain    Time 3    Period Weeks    Status Achieved              PT Long Term Goals - 08/04/20 1335      PT LONG TERM GOAL #1   Title Pt to be I in an advance HEP to improve function.    Time 6    Period Weeks    Status Achieved      PT LONG TERM GOAL #2   Title Pt ROM to be 0-130 to allow squatting for work activities.    Time 6    Period Weeks    Status Achieved      PT LONG TERM GOAL #3   Title PT strength of LE to be at least 4+/5 to allow lifting and squatting for work activities    Time 6    Period Weeks    Status On-going      PT LONG TERM GOAL #4   Title Pt pain to be no greater than a 2/10 to allow pt to be able to be wb for 4 hours at a time without brace for work activities.    Baseline 10/11 at least 4 hours, on his feet most of the day    Time 6    Period Weeks    Status Achieved                 Plan - 08/10/20 1408    Clinical Impression Statement Patient able to complete lunges today with UE support for balance. Patient requires verbal cueing and prior demonstration for SLS with mini squat vectors due to difficulty with sequencing. Patient continues to remain challenged with leg press and strengthening exercises performed today secondary to impaired quad strength. Patient highly motivated to improve function. Patient will continue to benefit from skilled physical therapy in order to reduce impairment and improve function.    Personal Factors and Comorbidities Fitness;Time since onset of injury/illness/exacerbation    Examination-Activity Limitations Bend;Carry;Lift;Locomotion Level;Stairs;Stand;Squat;Sit    Examination-Participation Restrictions Cleaning;Yard Work;Other;Occupation    Stability/Clinical Decision Making Stable/Uncomplicated    Rehab Potential Good    PT Frequency 3x / week    PT Duration 6 weeks    PT Treatment/Interventions Gait training;Stair training;Functional mobility training;Therapeutic activities;Therapeutic exercise;Manual techniques    PT Next Visit Plan  Continue progressing according  to [name] ACL protocol. Complete 1RM at week 12 and begin plyometrics if quad/ham are at 65%.    PT Home Exercise Plan ankle pumps, quad and ham set, heel slide, heelraises and minisquats 9/13 prone hamstring curl, SLR 07/06/20: bridge;9/27 sit to stand, step up , wall squat           Patient will benefit from skilled therapeutic intervention in order to improve the following deficits and impairments:  Abnormal gait, Decreased activity tolerance, Decreased range of motion, Difficulty walking, Decreased strength, Pain, Increased edema  Visit Diagnosis: Muscle weakness (generalized)  Difficulty in walking, not elsewhere classified  Stiffness of right knee, not elsewhere classified     Problem List Patient Active Problem List   Diagnosis Date Noted  . S/P ACL repair right 06/02/20 06/07/2020  . 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 Derangement of posterior horn of medial meniscus of right knee   . Abnormal LFTs 02/02/2019  . Acute myopericarditis 04/05/2016  . Chest pain 04/05/2016  . Elevated troponin 04/05/2016  . Right ACL tear 11/16/2015  . ACL (anterior cruciate ligament) rupture 05/05/2013  . 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 Intermittent explosive disorder 10/03/2011   2:45 PM, 08/10/20 Wyman Songster PT, DPT Physical Therapist at [facility]   [facility] [address] [place], [place], [address] Phone: [phone]   Fax:  [phone]  Name: Joseph Blair MRN: [id number] Date of Birth: 1991/10/13

## 2020-08-11 ENCOUNTER — Telehealth (HOSPITAL_COMMUNITY): Payer: Self-pay | Admitting: Physical Therapy

## 2020-08-11 ENCOUNTER — Ambulatory Visit (HOSPITAL_COMMUNITY): Payer: Medicaid Other | Admitting: Physical Therapy

## 2020-08-11 NOTE — Telephone Encounter (Signed)
No show. Called and left voicemail about missed appointment and upcoming appointment.   9:34 AM, 08/11/20 Tereasa Coop, DPT Physical Therapy with [facility]  [phone] office

## 2020-08-17 ENCOUNTER — Ambulatory Visit (HOSPITAL_COMMUNITY): Payer: Medicaid Other | Attending: Orthopedic Surgery | Admitting: Physical Therapy

## 2020-08-17 ENCOUNTER — Other Ambulatory Visit: Payer: Self-pay

## 2020-08-17 ENCOUNTER — Encounter (HOSPITAL_COMMUNITY): Payer: Self-pay | Admitting: Physical Therapy

## 2020-08-17 DIAGNOSIS — R262 Difficulty in walking, not elsewhere classified: Secondary | ICD-10-CM

## 2020-08-17 DIAGNOSIS — M25661 Stiffness of right knee, not elsewhere classified: Secondary | ICD-10-CM | POA: Insufficient documentation

## 2020-08-17 DIAGNOSIS — M6281 Muscle weakness (generalized): Secondary | ICD-10-CM | POA: Insufficient documentation

## 2020-08-17 NOTE — Therapy (Signed)
[facility] [address] [place], [place], [address] Phone: [phone]   Fax:  [phone]  Physical Therapy Treatment  Patient Details  Name: Joseph Blair MRN: [id number] Date of Birth: [birth year] Referring Provider (PT): Fuller Canada    Encounter Date: 08/17/2020   PT End of Session - 08/17/20 1403    Visit Number 15    Number of Visits 25    Date for PT Re-Evaluation 09/03/20    Authorization Type medicaid    Authorization Time Period 08/08/20-09/04/20    Authorization - Visit Number 2    Authorization - Number of Visits 12    Progress Note Due on Visit 18    PT Start Time 1401    PT Stop Time 1439    PT Time Calculation (min) 38 min    Activity Tolerance Patient tolerated treatment well    Behavior During Therapy [facility] for tasks assessed/performed           Past Medical History:  Diagnosis Date  . ADHD (attention deficit hyperactivity disorder)   . GERD (gastroesophageal reflux disease)   . Hypertension   . Pancreas disorder 2013   patient reports he was told he might have pancreatitis. No elevated lipase or imaging to support per EPIC review    Past Surgical History:  Procedure Laterality Date  . ANTERIOR CRUCIATE LIGAMENT REPAIR     Left knee Dr. Edger House  . ANTERIOR CRUCIATE LIGAMENT REPAIR Right 06/02/2020   Procedure: RIGHT ANTERIOR CRUCIATE LIGAMENT (ACL) REPAIR WITH ALLOGRAFT;  Surgeon: Vickki Hearing, MD;  Location: [facility];  Service: Orthopedics;  Laterality: Right;  . HERNIA REPAIR    . KNEE ARTHROSCOPY WITH MEDIAL MENISECTOMY Right 06/02/2020   Procedure: RIGHT KNEE ARTHROSCOPY WITH MEDIAL MENISECTOMY;  Surgeon: Vickki Hearing, MD;  Location: [facility];  Service: Orthopedics;  Laterality: Right;    There were no vitals filed for this visit.   Subjective Assessment - 08/17/20 1404    Subjective Reports no pain or difficulties. Started work with no difficulties. Exercises going well.     Pertinent History ACL tear    Limitations Standing;Walking;House hold activities    How long can you stand comfortably? Pt is able to stand as long as he needs to without a brace was. 3-5 minutes with brace    How long can you walk comfortably? PT is able to walk as long as he needs to without the brace was.  with brace on 20  minutes occasionally uses crutch    Currently in Pain? No/denies              [facility] PT Assessment - 08/17/20 0001      Assessment   Medical Diagnosis Rt ACL    Referring Provider (PT) Fuller Canada     Next MD Visit november 29th                          [facility] Adult PT Treatment/Exercise - 08/17/20 0001      Knee/Hip Exercises: Aerobic   Elliptical 2' fwd, 2' bkwd      Knee/Hip Exercises: Standing   Other Standing Knee Exercises single leg romainian deadlift with one upper extremity assist and pole to help wtih contralateral leg extension 4x5 B     Other Standing Knee Exercises bulgarian split squats 5x5 B with 4" step and occassional UE support       Knee/Hip Exercises: Supine   Henreitta Leber  10 reps   walkouts     Knee/Hip Exercises: Prone   Other Prone Exercises quadruped  leg "pedals" 3x15 B     Other Prone Exercises quadruped  planks x4 10" holds                     PT Short Term Goals - 08/04/20 1334      PT SHORT TERM GOAL #1   Title PT to be I in HEP to improve function.    Baseline 07/04/20: Reports intermittent compliance    Time 3    Period Weeks    Status Achieved    Target Date 07/15/20      PT SHORT TERM GOAL #2   Title PT ROM at least -4-115    Baseline 07/04/20: 0-113 degrees 10/11 0-128    Time 3    Period Weeks    Status Achieved      PT SHORT TERM GOAL #3   Title PT pain to be no greater than a 5/10 to allow pt to be able to walk for an hour at a time.    Baseline 07/04/20: Patient reports ability to ambulate for at least 1 hour with no more than 5/10 pain    Time 3    Period Weeks    Status  Achieved             PT Long Term Goals - 08/04/20 1335      PT LONG TERM GOAL #1   Title Pt to be I in an advance HEP to improve function.    Time 6    Period Weeks    Status Achieved      PT LONG TERM GOAL #2   Title Pt ROM to be 0-130 to allow squatting for work activities.    Time 6    Period Weeks    Status Achieved      PT LONG TERM GOAL #3   Title PT strength of LE to be at least 4+/5 to allow lifting and squatting for work activities    Time 6    Period Weeks    Status On-going      PT LONG TERM GOAL #4   Title Pt pain to be no greater than a 2/10 to allow pt to be able to be wb for 4 hours at a time without brace for work activities.    Baseline 10/11 at least 4 hours, on his feet most of the day    Time 6    Period Weeks    Status Achieved                 Plan - 08/17/20 1434    Clinical Impression Statement Tolerated new exercises well. Verbal cues required throughout session and prior demonstration. Form was not as good with fatigue and patient needed to be cued to take longer rest breaks. Fatigue noted end of session in both anterior and posterior sides of legs. Will continue with current POC.    Personal Factors and Comorbidities Fitness;Time since onset of injury/illness/exacerbation    Examination-Activity Limitations Bend;Carry;Lift;Locomotion Level;Stairs;Stand;Squat;Sit    Examination-Participation Restrictions Cleaning;Yard Work;Other;Occupation    Stability/Clinical Decision Making Stable/Uncomplicated    Rehab Potential Good    PT Frequency 3x / week    PT Duration 6 weeks    PT Treatment/Interventions Gait training;Stair training;Functional mobility training;Therapeutic activities;Therapeutic exercise;Manual techniques    PT Next Visit Plan Continue progressing according to [name] ACL protocol.  Complete 1RM at week 12 and begin plyometrics if quad/ham are at 65%.    PT Home Exercise Plan ankle pumps, quad and ham set, heel slide,  heelraises and minisquats 9/13 prone hamstring curl, SLR 07/06/20: bridge;9/27 sit to stand, step up , wall squat           Patient will benefit from skilled therapeutic intervention in order to improve the following deficits and impairments:  Abnormal gait, Decreased activity tolerance, Decreased range of motion, Difficulty walking, Decreased strength, Pain, Increased edema  Visit Diagnosis: Muscle weakness (generalized)  Difficulty in walking, not elsewhere classified  Stiffness of right knee, not elsewhere classified     Problem List Patient Active Problem List   Diagnosis Date Noted  . S/P ACL repair right 06/02/20 06/07/2020  . 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 Derangement of posterior horn of medial meniscus of right knee   . Abnormal LFTs 02/02/2019  . Acute myopericarditis 04/05/2016  . Chest pain 04/05/2016  . Elevated troponin 04/05/2016  . Right ACL tear 11/16/2015  . ACL (anterior cruciate ligament) rupture 05/05/2013  . 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 Intermittent explosive disorder 10/03/2011    2:38 PM, 08/17/20 Tereasa Coop, DPT Physical Therapy with [facility]  [phone] office   [facility] [address] [place], [place], [address] Phone: [phone]   Fax:  [phone]  Name: Joseph Blair MRN: [id number] Date of Birth: 1991/08/19

## 2020-08-18 ENCOUNTER — Ambulatory Visit (HOSPITAL_COMMUNITY): Payer: Medicaid Other | Admitting: Physical Therapy

## 2020-08-18 DIAGNOSIS — M6281 Muscle weakness (generalized): Secondary | ICD-10-CM

## 2020-08-18 DIAGNOSIS — R262 Difficulty in walking, not elsewhere classified: Secondary | ICD-10-CM

## 2020-08-18 NOTE — Therapy (Signed)
Pt came into therapy stating he thought his BP was up as he is out of his meds and didn't take one today.  BP Lt UE 200/120 mmHg.  Had another therapy take BP as well ( A Zaunegger PT) who got a reading of 180/120 mmHg.  Treatment was deferred today.  Pt was headed next door to pick up his BP meds.  Instructed pt to also notify his MD regarding his high BP after missing 1 dose.   Lurena Nida, PTA/CLT [phone]

## 2020-08-19 ENCOUNTER — Ambulatory Visit (HOSPITAL_COMMUNITY): Payer: Medicaid Other | Admitting: Physical Therapy

## 2020-08-19 ENCOUNTER — Telehealth (HOSPITAL_COMMUNITY): Payer: Self-pay | Admitting: Physical Therapy

## 2020-08-19 NOTE — Telephone Encounter (Signed)
pt cancelled appt for today because he is too drowsy

## 2020-08-22 ENCOUNTER — Ambulatory Visit (HOSPITAL_COMMUNITY): Payer: Medicaid Other | Admitting: Physical Therapy

## 2020-08-22 ENCOUNTER — Encounter (HOSPITAL_COMMUNITY): Payer: Self-pay | Admitting: Physical Therapy

## 2020-08-22 ENCOUNTER — Other Ambulatory Visit: Payer: Self-pay

## 2020-08-22 DIAGNOSIS — M6281 Muscle weakness (generalized): Secondary | ICD-10-CM | POA: Diagnosis not present

## 2020-08-22 DIAGNOSIS — R262 Difficulty in walking, not elsewhere classified: Secondary | ICD-10-CM

## 2020-08-22 DIAGNOSIS — M25661 Stiffness of right knee, not elsewhere classified: Secondary | ICD-10-CM

## 2020-08-22 NOTE — Therapy (Signed)
[facility] [address] [place], [place], [address] Phone: [phone]   Fax:  [phone]  Physical Therapy Treatment  Patient Details  Name: Joseph Blair MRN: [id number] Date of Birth: [birth year] Referring Provider (PT): Fuller Canada    Encounter Date: 08/22/2020   PT End of Session - 08/22/20 1534    Visit Number 16    Number of Visits 25    Date for PT Re-Evaluation 09/03/20    Authorization Type medicaid    Authorization Time Period 08/08/20-09/04/20    Authorization - Visit Number 3    Authorization - Number of Visits 12    Progress Note Due on Visit 18    PT Start Time 1531    PT Stop Time 1610    PT Time Calculation (min) 39 min    Activity Tolerance Patient tolerated treatment well    Behavior During Therapy [facility] for tasks assessed/performed           Past Medical History:  Diagnosis Date  . ADHD (attention deficit hyperactivity disorder)   . GERD (gastroesophageal reflux disease)   . Hypertension   . Pancreas disorder 2013   patient reports he was told he might have pancreatitis. No elevated lipase or imaging to support per EPIC review    Past Surgical History:  Procedure Laterality Date  . ANTERIOR CRUCIATE LIGAMENT REPAIR     Left knee Dr. Edger House  . ANTERIOR CRUCIATE LIGAMENT REPAIR Right 06/02/2020   Procedure: RIGHT ANTERIOR CRUCIATE LIGAMENT (ACL) REPAIR WITH ALLOGRAFT;  Surgeon: Vickki Hearing, MD;  Location: [facility];  Service: Orthopedics;  Laterality: Right;  . HERNIA REPAIR    . KNEE ARTHROSCOPY WITH MEDIAL MENISECTOMY Right 06/02/2020   Procedure: RIGHT KNEE ARTHROSCOPY WITH MEDIAL MENISECTOMY;  Surgeon: Vickki Hearing, MD;  Location: [facility];  Service: Orthopedics;  Laterality: Right;    There were no vitals filed for this visit.   Subjective Assessment - 08/22/20 1535    Subjective Reports no pain or difficulties since last session. Reports that he feels his blood pressure is  better, went on his blood pressure medications the same day he left.    Pertinent History ACL tear    Limitations Standing;Walking;House hold activities    How long can you stand comfortably? Pt is able to stand as long as he needs to without a brace was. 3-5 minutes with brace    How long can you walk comfortably? PT is able to walk as long as he needs to without the brace was.  with brace on 20  minutes occasionally uses crutch              [facility] PT Assessment - 08/22/20 0001      Assessment   Medical Diagnosis Rt ACL    Referring Provider (PT) Fuller Canada                          [facility] Adult PT Treatment/Exercise - 08/22/20 0001      Knee/Hip Exercises: Stretches   Gastroc Stretch 3 reps;Both;30 seconds      Knee/Hip Exercises: Aerobic   Elliptical 2' fwd, 2' bkwd      Knee/Hip Exercises: Plyometrics   Other Plyometric Exercises pre-plyo pulses for double leg heel raise in upper and lower position - 3x4 in each position      Knee/Hip Exercises: Standing   Other Standing Knee Exercises crossover step ups 6" R 3x10; fwd  cone taps (romainian deadlift) - L/R/C 2x10 Each cone; semi Single leg squat to table height of 23 inches 4x5 B, squat to up on toes 2x5    Other Standing Knee Exercises heel taps4" step with 2 hand hold for balance 3x10 R                     PT Short Term Goals - 08/04/20 1334      PT SHORT TERM GOAL #1   Title PT to be I in HEP to improve function.    Baseline 07/04/20: Reports intermittent compliance    Time 3    Period Weeks    Status Achieved    Target Date 07/15/20      PT SHORT TERM GOAL #2   Title PT ROM at least -4-115    Baseline 07/04/20: 0-113 degrees 10/11 0-128    Time 3    Period Weeks    Status Achieved      PT SHORT TERM GOAL #3   Title PT pain to be no greater than a 5/10 to allow pt to be able to walk for an hour at a time.    Baseline 07/04/20: Patient reports ability to ambulate for at least 1 hour  with no more than 5/10 pain    Time 3    Period Weeks    Status Achieved             PT Long Term Goals - 08/04/20 1335      PT LONG TERM GOAL #1   Title Pt to be I in an advance HEP to improve function.    Time 6    Period Weeks    Status Achieved      PT LONG TERM GOAL #2   Title Pt ROM to be 0-130 to allow squatting for work activities.    Time 6    Period Weeks    Status Achieved      PT LONG TERM GOAL #3   Title PT strength of LE to be at least 4+/5 to allow lifting and squatting for work activities    Time 6    Period Weeks    Status On-going      PT LONG TERM GOAL #4   Title Pt pain to be no greater than a 2/10 to allow pt to be able to be wb for 4 hours at a time without brace for work activities.    Baseline 10/11 at least 4 hours, on his feet most of the day    Time 6    Period Weeks    Status Achieved                 Plan - 08/22/20 1605    Clinical Impression Statement Continued focus on lower extremity strengthening. This was tolerated well. Fatigue in legs noted but no report of pain. Cued patient for form throughout. Added pulses for pre plyo work and this was tolerated well. Will continue to progress patient as tolerated, patient is 12 weeks post operative. Will assess strength pending patient presentation.    Personal Factors and Comorbidities Fitness;Time since onset of injury/illness/exacerbation    Examination-Activity Limitations Bend;Carry;Lift;Locomotion Level;Stairs;Stand;Squat;Sit    Examination-Participation Restrictions Cleaning;Yard Work;Other;Occupation    Stability/Clinical Decision Making Stable/Uncomplicated    Rehab Potential Good    PT Frequency 3x / week    PT Duration 6 weeks    PT Treatment/Interventions Gait training;Stair training;Functional mobility training;Therapeutic activities;Therapeutic exercise;Manual techniques  PT Next Visit Plan Continue progressing according to [name] ACL protocol. Complete 1RM at  week 12 (08/25/20) this and begin plyometrics if quad/ham are at 65%.    PT Home Exercise Plan ankle pumps, quad and ham set, heel slide, heelraises and minisquats 9/13 prone hamstring curl, SLR 07/06/20: bridge;9/27 sit to stand, step up , wall squat           Patient will benefit from skilled therapeutic intervention in order to improve the following deficits and impairments:  Abnormal gait, Decreased activity tolerance, Decreased range of motion, Difficulty walking, Decreased strength, Pain, Increased edema  Visit Diagnosis: Difficulty in walking, not elsewhere classified  Muscle weakness (generalized)  Stiffness of right knee, not elsewhere classified     Problem List Patient Active Problem List   Diagnosis Date Noted  . S/P ACL repair right 06/02/20 06/07/2020  . 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 Derangement of posterior horn of medial meniscus of right knee   . Abnormal LFTs 02/02/2019  . Acute myopericarditis 04/05/2016  . Chest pain 04/05/2016  . Elevated troponin 04/05/2016  . Right ACL tear 11/16/2015  . ACL (anterior cruciate ligament) rupture 05/05/2013  . 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 Intermittent explosive disorder 10/03/2011    4:11 PM, 08/22/20 Tereasa Coop, DPT Physical Therapy with [facility]  [phone] office  [facility] [address] [place], [place], [address] Phone: [phone]   Fax:  [phone]  Name: Joseph Blair MRN: [id number] Date of Birth: 07/12/1991

## 2020-08-24 ENCOUNTER — Ambulatory Visit (HOSPITAL_COMMUNITY): Payer: Medicaid Other | Admitting: Physical Therapy

## 2020-08-24 ENCOUNTER — Other Ambulatory Visit: Payer: Self-pay

## 2020-08-24 ENCOUNTER — Encounter (HOSPITAL_COMMUNITY): Payer: Self-pay | Admitting: Physical Therapy

## 2020-08-24 DIAGNOSIS — M6281 Muscle weakness (generalized): Secondary | ICD-10-CM | POA: Diagnosis not present

## 2020-08-24 DIAGNOSIS — R262 Difficulty in walking, not elsewhere classified: Secondary | ICD-10-CM

## 2020-08-24 DIAGNOSIS — M25661 Stiffness of right knee, not elsewhere classified: Secondary | ICD-10-CM

## 2020-08-24 NOTE — Therapy (Signed)
[facility] [address] [place], [place], [address] Phone: [phone]   Fax:  [phone]  Physical Therapy Treatment  Patient Details  Name: Joseph Blair MRN: [id number] Date of Birth: 1991-02-18 Referring Provider (PT): Fuller Canada    Encounter Date: 08/24/2020   PT End of Session - 08/24/20 1130    Visit Number 17    Number of Visits 25    Date for PT Re-Evaluation 09/03/20    Authorization Type medicaid    Authorization Time Period 08/08/20-09/04/20    Authorization - Visit Number 4    Authorization - Number of Visits 12    Progress Note Due on Visit 18    PT Start Time 1130    PT Stop Time 1155   has to leave early to pick up daughter   PT Time Calculation (min) 25 min    Activity Tolerance Patient tolerated treatment well    Behavior During Therapy [facility] for tasks assessed/performed           Past Medical History:  Diagnosis Date  . ADHD (attention deficit hyperactivity disorder)   . GERD (gastroesophageal reflux disease)   . Hypertension   . Pancreas disorder 2013   patient reports he was told he might have pancreatitis. No elevated lipase or imaging to support per EPIC review    Past Surgical History:  Procedure Laterality Date  . ANTERIOR CRUCIATE LIGAMENT REPAIR     Left knee Dr. Edger House  . ANTERIOR CRUCIATE LIGAMENT REPAIR Right 06/02/2020   Procedure: RIGHT ANTERIOR CRUCIATE LIGAMENT (ACL) REPAIR WITH ALLOGRAFT;  Surgeon: Vickki Hearing, MD;  Location: [facility];  Service: Orthopedics;  Laterality: Right;  . HERNIA REPAIR    . KNEE ARTHROSCOPY WITH MEDIAL MENISECTOMY Right 06/02/2020   Procedure: RIGHT KNEE ARTHROSCOPY WITH MEDIAL MENISECTOMY;  Surgeon: Vickki Hearing, MD;  Location: [facility];  Service: Orthopedics;  Laterality: Right;    There were no vitals filed for this visit.   Subjective Assessment - 08/24/20 1131    Subjective Reports no pain or difficulties since last session.     Pertinent History ACL tear    Limitations Standing;Walking;House hold activities    How long can you stand comfortably? Pt is able to stand as long as he needs to without a brace was. 3-5 minutes with brace    How long can you walk comfortably? PT is able to walk as long as he needs to without the brace was.  with brace on 20  minutes occasionally uses crutch    Currently in Pain? No/denies              [facility] PT Assessment - 08/24/20 0001      Assessment   Medical Diagnosis Rt ACL    Referring Provider (PT) Fuller Canada     Onset Date/Surgical Date 06/02/20                         [facility] Adult PT Treatment/Exercise - 08/24/20 0001      Knee/Hip Exercises: Aerobic   Elliptical 2' fwd, 2' bkwd      Knee/Hip Exercises: Machines for Strengthening   Cybex Knee Extension SL R unable to perform full ROM - able to perform 19 double leg at 2 plates, 5E52 total with R doing most of work with 1 weight place and left assisting     Cybex Knee Flexion 45# DL 53 continuous ; 77# R only 9  PT Short Term Goals - 08/04/20 1334      PT SHORT TERM GOAL #1   Title PT to be I in HEP to improve function.    Baseline 07/04/20: Reports intermittent compliance    Time 3    Period Weeks    Status Achieved    Target Date 07/15/20      PT SHORT TERM GOAL #2   Title PT ROM at least -4-115    Baseline 07/04/20: 0-113 degrees 10/11 0-128    Time 3    Period Weeks    Status Achieved      PT SHORT TERM GOAL #3   Title PT pain to be no greater than a 5/10 to allow pt to be able to walk for an hour at a time.    Baseline 07/04/20: Patient reports ability to ambulate for at least 1 hour with no more than 5/10 pain    Time 3    Period Weeks    Status Achieved             PT Long Term Goals - 08/04/20 1335      PT LONG TERM GOAL #1   Title Pt to be I in an advance HEP to improve function.    Time 6    Period Weeks    Status Achieved      PT LONG  TERM GOAL #2   Title Pt ROM to be 0-130 to allow squatting for work activities.    Time 6    Period Weeks    Status Achieved      PT LONG TERM GOAL #3   Title PT strength of LE to be at least 4+/5 to allow lifting and squatting for work activities    Time 6    Period Weeks    Status On-going      PT LONG TERM GOAL #4   Title Pt pain to be no greater than a 2/10 to allow pt to be able to be wb for 4 hours at a time without brace for work activities.    Baseline 10/11 at least 4 hours, on his feet most of the day    Time 6    Period Weeks    Status Achieved                 Plan - 08/24/20 1157    Clinical Impression Statement Focused on determine one rep max with machines and patient unable to perform single leg knee extension with one plate through entire ROM secondary to weakness in quad. Educated patient on these results and continued need for quad strengthening. Session ended early secondary to patient needing to leave to pick up daughter.    Personal Factors and Comorbidities Fitness;Time since onset of injury/illness/exacerbation    Examination-Activity Limitations Bend;Carry;Lift;Locomotion Level;Stairs;Stand;Squat;Sit    Examination-Participation Restrictions Cleaning;Yard Work;Other;Occupation    Stability/Clinical Decision Making Stable/Uncomplicated    Rehab Potential Good    PT Frequency 3x / week    PT Duration 6 weeks    PT Treatment/Interventions Gait training;Stair training;Functional mobility training;Therapeutic activities;Therapeutic exercise;Manual techniques    PT Next Visit Plan continue terminal knee extension quad strengthening - not ready for  plyos at this time. Continue progressing according to [name] ACL protocol. Complete 1RM at week 12 (08/25/20) this and begin plyometrics if quad/ham are at 65%.    PT Home Exercise Plan ankle pumps, quad and ham set, heel slide, heelraises and minisquats 9/13 prone hamstring curl,  SLR 07/06/20: bridge;9/27  sit to stand, step up , wall squat           Patient will benefit from skilled therapeutic intervention in order to improve the following deficits and impairments:  Abnormal gait, Decreased activity tolerance, Decreased range of motion, Difficulty walking, Decreased strength, Pain, Increased edema  Visit Diagnosis: Difficulty in walking, not elsewhere classified  Muscle weakness (generalized)  Stiffness of right knee, not elsewhere classified     Problem List Patient Active Problem List   Diagnosis Date Noted  . S/P ACL repair right 06/02/20 06/07/2020  . 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 Derangement of posterior horn of medial meniscus of right knee   . Abnormal LFTs 02/02/2019  . Acute myopericarditis 04/05/2016  . Chest pain 04/05/2016  . Elevated troponin 04/05/2016  . Right ACL tear 11/16/2015  . ACL (anterior cruciate ligament) rupture 05/05/2013  . 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 Intermittent explosive disorder 10/03/2011  12:05 PM, 08/24/20 Tereasa Coop, DPT Physical Therapy with [facility]  [phone] office  [facility] [address] [place], [place], [address] Phone: [phone]   Fax:  [phone]  Name: JAVONTAY VANDAM MRN: [id number] Date of Birth: 08-09-1991

## 2020-08-25 ENCOUNTER — Telehealth (HOSPITAL_COMMUNITY): Payer: Self-pay | Admitting: Physical Therapy

## 2020-08-25 ENCOUNTER — Ambulatory Visit (HOSPITAL_COMMUNITY): Payer: Medicaid Other | Admitting: Physical Therapy

## 2020-08-25 NOTE — Telephone Encounter (Signed)
Patient no show, left message for patient making him aware of missed appointment and instructed him to call back to continue therapy since today was his last schedule appointment.   3:06 PM, 08/25/20 Wyman Songster PT, DPT Physical Therapist at [facility]

## 2020-08-29 ENCOUNTER — Encounter (HOSPITAL_COMMUNITY): Payer: Medicaid Other | Admitting: Physical Therapy

## 2020-08-31 ENCOUNTER — Encounter (HOSPITAL_COMMUNITY): Payer: Medicaid Other | Admitting: Physical Therapy

## 2020-09-02 ENCOUNTER — Encounter (HOSPITAL_COMMUNITY): Payer: Medicaid Other | Admitting: Physical Therapy

## 2020-09-12 ENCOUNTER — Ambulatory Visit (INDEPENDENT_AMBULATORY_CARE_PROVIDER_SITE_OTHER): Payer: Medicaid Other | Admitting: Orthopedic Surgery

## 2020-09-12 ENCOUNTER — Other Ambulatory Visit: Payer: Self-pay

## 2020-09-12 ENCOUNTER — Encounter: Payer: Self-pay | Admitting: Orthopedic Surgery

## 2020-09-12 VITALS — BP 149/98 | HR 81 | Ht 65.0 in | Wt 224.0 lb

## 2020-09-12 DIAGNOSIS — S83511D Sprain of anterior cruciate ligament of right knee, subsequent encounter: Secondary | ICD-10-CM | POA: Diagnosis not present

## 2020-09-12 DIAGNOSIS — Z9889 Other specified postprocedural states: Secondary | ICD-10-CM

## 2020-09-12 NOTE — Patient Instructions (Signed)
CONTINUE HOME EXERCISES  °

## 2020-09-12 NOTE — Progress Notes (Signed)
Chief Complaint  Patient presents with  . Knee Pain    Right knee, S/P ACL repair 06/02/20. Some pain when putting pressure on the knee.    Joseph Blair had an ACL reconstruction right knee 3-1/2 months ago he is doing well  He also has an ACL problem on the left knee which we will address later  As far as the right knee goes he is regained his full range of motion he has a mild joint effusion it is minimal he has a very good Lachman test  Recommend continue home exercises  Follow-up in January to preop for February for left ACL reconstruction  Encounter Diagnoses  Name Primary?  . Rupture of anterior cruciate ligament of right knee, subsequent encounter Yes  . S/P ACL repair

## 2020-09-21 ENCOUNTER — Other Ambulatory Visit: Payer: Self-pay

## 2020-09-21 ENCOUNTER — Encounter (HOSPITAL_COMMUNITY): Payer: Self-pay | Admitting: Physical Therapy

## 2020-09-21 ENCOUNTER — Ambulatory Visit (HOSPITAL_COMMUNITY): Payer: Medicaid Other | Attending: Orthopedic Surgery | Admitting: Physical Therapy

## 2020-09-21 DIAGNOSIS — M6281 Muscle weakness (generalized): Secondary | ICD-10-CM | POA: Insufficient documentation

## 2020-09-21 DIAGNOSIS — M25661 Stiffness of right knee, not elsewhere classified: Secondary | ICD-10-CM | POA: Diagnosis present

## 2020-09-21 DIAGNOSIS — R262 Difficulty in walking, not elsewhere classified: Secondary | ICD-10-CM | POA: Diagnosis present

## 2020-09-21 NOTE — Therapy (Signed)
[facility] [address] [place], [place], [address] Phone: [phone]   Fax:  [phone]  Physical Therapy Treatment/Discharge Summary  Patient Details  Name: Joseph Blair MRN: [id number] Date of Birth: 12-01-1990 Referring Provider (PT): Arther Abbott    Encounter Date: 09/21/2020   PHYSICAL THERAPY DISCHARGE SUMMARY  Visits from Start of Care: 18  Current functional level related to goals / functional outcomes: See below   Remaining deficits: See below   Education / Equipment: See below  Plan: Patient agrees to discharge.  Patient goals were met. Patient is being discharged due to meeting the stated rehab goals.  ?????        PT End of Session - 09/21/20 0924    Visit Number 18    Number of Visits 25    Date for PT Re-Evaluation 09/03/20    Authorization Type medicaid    Authorization Time Period 08/08/20-09/04/20    Authorization - Visit Number 1    Authorization - Number of Visits 1    Progress Note Due on Visit 18    PT Start Time 0922    PT Stop Time 0948    PT Time Calculation (min) 26 min    Activity Tolerance Patient tolerated treatment well    Behavior During Therapy [facility] for tasks assessed/performed           Past Medical History:  Diagnosis Date  . ADHD (attention deficit hyperactivity disorder)   . GERD (gastroesophageal reflux disease)   . Hypertension   . Pancreas disorder 2013   patient reports he was told he might have pancreatitis. No elevated lipase or imaging to support per EPIC review    Past Surgical History:  Procedure Laterality Date  . ANTERIOR CRUCIATE LIGAMENT REPAIR     Left knee Dr. Garfield Cornea  . ANTERIOR CRUCIATE LIGAMENT REPAIR Right 06/02/2020   Procedure: RIGHT ANTERIOR CRUCIATE LIGAMENT (ACL) REPAIR WITH ALLOGRAFT;  Surgeon: Carole Civil, MD;  Location: [facility];  Service: Orthopedics;  Laterality: Right;  . HERNIA REPAIR    . KNEE ARTHROSCOPY WITH MEDIAL  MENISECTOMY Right 06/02/2020   Procedure: RIGHT KNEE ARTHROSCOPY WITH MEDIAL MENISECTOMY;  Surgeon: Carole Civil, MD;  Location: [facility];  Service: Orthopedics;  Laterality: Right;    There were no vitals filed for this visit.   Subjective Assessment - 09/21/20 0925    Subjective Patient states his knee has been doing good. he has his old sheets of exercises but not the newer ones.    Pertinent History ACL tear    Limitations Standing;Walking;House hold activities    How long can you stand comfortably? Pt is able to stand as long as he needs to without a brace was. 3-5 minutes with brace    How long can you walk comfortably? PT is able to walk as long as he needs to without the brace was.  with brace on 20  minutes occasionally uses crutch    Currently in Pain? No/denies              [facility] PT Assessment - 09/21/20 0001      Assessment   Medical Diagnosis Rt ACL    Referring Provider (PT) Arther Abbott     Onset Date/Surgical Date 06/02/20      Precautions   Precautions None      Restrictions   Weight Bearing Restrictions No      Balance Screen   Has the patient fallen in the past  6 months No    Has the patient had a decrease in activity level because of a fear of falling?  No    Is the patient reluctant to leave their home because of a fear of falling?  No      Prior Function   Level of Independence Independent      Cognition   Overall Cognitive Status Within Functional Limits for tasks assessed      Observation/Other Assessments   Observations Ambulates without AD      Strength   Right Knee Flexion 5/5    Right Knee Extension 4+/5    Left Knee Flexion 5/5    Left Knee Extension 5/5               Objective Review of HEP and exercise mechanics                  PT Education - 09/21/20 0925    Education Details Patient educated on HEP, exercise mechanics, progress made    Person(s) Educated Patient    Methods  Explanation;Demonstration;Handout    Comprehension Verbalized understanding;Returned demonstration            PT Short Term Goals - 08/04/20 1334      PT SHORT TERM GOAL #1   Title PT to be I in HEP to improve function.    Baseline 07/04/20: Reports intermittent compliance    Time 3    Period Weeks    Status Achieved    Target Date 07/15/20      PT SHORT TERM GOAL #2   Title PT ROM at least -4-115    Baseline 07/04/20: 0-113 degrees 10/11 0-128    Time 3    Period Weeks    Status Achieved      PT SHORT TERM GOAL #3   Title PT pain to be no greater than a 5/10 to allow pt to be able to walk for an hour at a time.    Baseline 07/04/20: Patient reports ability to ambulate for at least 1 hour with no more than 5/10 pain    Time 3    Period Weeks    Status Achieved             PT Long Term Goals - 09/21/20 0931      PT LONG TERM GOAL #1   Title Pt to be I in an advance HEP to improve function.    Time 6    Period Weeks    Status Achieved      PT LONG TERM GOAL #2   Title Pt ROM to be 0-130 to allow squatting for work activities.    Time 6    Period Weeks    Status Achieved      PT LONG TERM GOAL #3   Title PT strength of LE to be at least 4+/5 to allow lifting and squatting for work activities    Time 6    Period Weeks    Status Achieved      PT LONG TERM GOAL #4   Title Pt pain to be no greater than a 2/10 to allow pt to be able to be wb for 4 hours at a time without brace for work activities.    Baseline 10/11 at least 4 hours, on his feet most of the day    Time 6    Period Weeks    Status Achieved  Plan - 09-23-20 0924    Clinical Impression Statement Patient has met all short and long term goals at this time. Patient continues to lack strength but has shown significant improvement in strength, ROM, gait, endurance, activity tolerance and functional mobility since beginning therapy. Reviewed entire HEP with patient today and gave him  updated handouts reviewing exercise mechanics and reps. Patient agreeable to discharge with HEP at this time. Patient discharged from skilled physical therapy.    Personal Factors and Comorbidities Fitness;Time since onset of injury/illness/exacerbation    Examination-Activity Limitations Bend;Carry;Lift;Locomotion Level;Stairs;Stand;Squat;Sit    Examination-Participation Restrictions Cleaning;Yard Work;Other;Occupation    Stability/Clinical Decision Making Stable/Uncomplicated    Rehab Potential Good    PT Frequency --    PT Duration --    PT Treatment/Interventions Gait training;Stair training;Functional mobility training;Therapeutic activities;Therapeutic exercise;Manual techniques    PT Next Visit Plan n/a    PT Home Exercise Plan ankle pumps, quad and ham set, heel slide, heelraises and minisquats 9/13 prone hamstring curl, SLR 07/06/20: bridge;9/27 sit to stand, step up , wall squat, September 24, 2023 cross over step up, dead lift, single leg RDL, step down forward and lateral, split squat, quadruped leg plse, hip slide outs, SLS with vectors with mini squat           Patient will benefit from skilled therapeutic intervention in order to improve the following deficits and impairments:  Abnormal gait, Decreased activity tolerance, Decreased range of motion, Difficulty walking, Decreased strength, Pain, Increased edema  Visit Diagnosis: Difficulty in walking, not elsewhere classified  Muscle weakness (generalized)  Stiffness of right knee, not elsewhere classified     Problem List Patient Active Problem List   Diagnosis Date Noted  . S/P ACL repair right 06/02/20 06/07/2020  . 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 Derangement of posterior horn of medial meniscus of right knee   . Abnormal LFTs 02/02/2019  . Acute myopericarditis 04/05/2016  . Chest pain 04/05/2016  . Elevated troponin 04/05/2016  . Right ACL tear 11/16/2015  . ACL (anterior cruciate ligament)  rupture 05/05/2013  . 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 Intermittent explosive disorder 10/03/2011    9:56 AM, 09-23-2020 Mearl Latin PT, DPT Physical Therapist at [place], [place], [address] Phone: [phone]   Fax:  [phone]  Name: AYREN ZUMBRO MRN: [id number] Date of Birth: 06-Feb-1991

## 2020-09-21 NOTE — Patient Instructions (Signed)
Access Code: [email or web address] URL: [email or web address] Date: 09/21/2020 Prepared by: Greig Castilla Henryetta Corriveau  Exercises Crossover Step Up - 1 x daily - 7 x weekly - 2 sets - 10 reps Standing Hip Hinge with Dowel - 1 x daily - 7 x weekly - 2 sets - 10 reps Forward T - 1 x daily - 7 x weekly - 2 sets - 10 reps Lateral Step Down - 1 x daily - 7 x weekly - 2 sets - 10 reps Forward Step Down - 1 x daily - 7 x weekly - 2 sets - 10 reps Trail Leg Lunge - 1 x daily - 7 x weekly - 2 sets - 10 reps Primal Push Up - 1 x daily - 7 x weekly - 2 sets - 10 reps Standing Hip Adductor Stretch - 1 x daily - 7 x weekly - 2 sets - 10 reps Single Leg Balance with Clock Reach - 1 x daily - 7 x weekly - 5 reps

## 2020-10-04 ENCOUNTER — Other Ambulatory Visit: Payer: Self-pay

## 2020-10-04 ENCOUNTER — Ambulatory Visit
Admission: RE | Admit: 2020-10-04 | Discharge: 2020-10-04 | Disposition: A | Discharge status: Home / Self Care | Payer: Medicaid Other | Source: Ambulatory Visit | Attending: Family Medicine | Admitting: Family Medicine

## 2020-10-04 VITALS — BP 150/114 | HR 94 | Temp 98.6°F | Resp 16

## 2020-10-04 DIAGNOSIS — J0110 Acute frontal sinusitis, unspecified: Secondary | ICD-10-CM

## 2020-10-04 DIAGNOSIS — R0981 Nasal congestion: Secondary | ICD-10-CM

## 2020-10-04 MED ORDER — AMOXICILLIN-POT CLAVULANATE 875-125 MG PO TABS: 1.0000 | ORAL_TABLET | Freq: Two times a day (BID) | ORAL | 0 refills | Status: AC

## 2020-10-04 NOTE — ED Provider Notes (Signed)
[facility]   [id number] 10/04/20 Arrival Time: 1150  YP:PJKD THROAT  SUBJECTIVE: History from: patient.  Joseph Blair is a 29 y.o. male who presents with nasal congestion and sinus pain and pressure for the last 2-3 days. Reports that he tested positive for covid about 16 days ago. Has been taking ibuprofen with little relief.  Has positive history of Covid. Has not had Covid vaccines. Has tried ibuprofen without relief. There are no aggravating symptoms. Denies previous symptoms in the past.     Denies fever, chills, ear pain, rhinorrhea, cough, SOB, wheezing, chest pain, nausea, rash, changes in bowel or bladder habits.    ROS: As per HPI.  All other pertinent ROS negative.     Past Medical History:  Diagnosis Date   ADHD (attention deficit hyperactivity disorder)    GERD (gastroesophageal reflux disease)    Hypertension    Pancreas disorder 2013   patient reports he was told he might have pancreatitis. No elevated lipase or imaging to support per EPIC review   Past Surgical History:  Procedure Laterality Date   ANTERIOR CRUCIATE LIGAMENT REPAIR     Left knee Dr. Edger House   ANTERIOR CRUCIATE LIGAMENT REPAIR Right 06/02/2020   Procedure: RIGHT ANTERIOR CRUCIATE LIGAMENT (ACL) REPAIR WITH ALLOGRAFT;  Surgeon: Vickki Hearing, MD;  Location: [facility];  Service: Orthopedics;  Laterality: Right;   HERNIA REPAIR     KNEE ARTHROSCOPY WITH MEDIAL MENISECTOMY Right 06/02/2020   Procedure: RIGHT KNEE ARTHROSCOPY WITH MEDIAL MENISECTOMY;  Surgeon: Vickki Hearing, MD;  Location: [facility];  Service: Orthopedics;  Laterality: Right;   Allergies  Allergen Reactions   Bee Venom Swelling   No current facility-administered medications on file prior to encounter.   Current Outpatient Medications on File Prior to Encounter  Medication Sig Dispense Refill   hydrochlorothiazide (HYDRODIURIL) 25 MG tablet Take 25 mg by mouth daily.     ibuprofen (ADVIL) 800 MG  tablet Take 1 tablet (800 mg total) by mouth every 8 (eight) hours as needed. 90 tablet 1   lisinopril (PRINIVIL,ZESTRIL) 10 MG tablet Take 10 mg by mouth daily.  0   naproxen (NAPROSYN) 500 MG tablet Take 1 tablet (500 mg total) by mouth 2 (two) times daily with a meal. 30 tablet 0   omeprazole (PRILOSEC) 20 MG capsule Take 20 mg by mouth daily.     promethazine (PHENERGAN) 12.5 MG tablet Take 1 tablet (12.5 mg total) by mouth every 6 (six) hours as needed for nausea or vomiting. 30 tablet 0   Social History   Socioeconomic History   Marital status: Married    Spouse name: Not on file   Number of children: Not on file   Years of education: Not on file   Highest education level: Not on file  Occupational History   Not on file  Tobacco Use   Smoking status: Current Some Day Smoker    Types: Cigars   Smokeless tobacco: Never Used  Substance and Sexual Activity   Alcohol use: Yes    Comment: prior to 11/2018 was drinking heavy etoh daily, now doesn't drink every day (now pint brown liqour a day)   Drug use: Not Currently    Types: Marijuana   Sexual activity: Yes  Other Topics Concern   Not on file  Social History Narrative   Not on file   Social Determinants of Health   Financial Resource Strain: Not on file  Food Insecurity: Not on file  Transportation  Needs: Not on file  Physical Activity: Not on file  Stress: Not on file  Social Connections: Not on file  Intimate Partner Violence: Not on file   Family History  Problem Relation Age of Onset   ADD / ADHD Brother    ADD / ADHD Maternal Aunt    Drug abuse Maternal Uncle    Alcohol abuse Maternal Uncle    Schizophrenia Maternal Uncle    Colon cancer Paternal Aunt        64 years old   Anxiety disorder Neg Hx    Bipolar disorder Neg Hx    Dementia Neg Hx    Depression Neg Hx    OCD Neg Hx    Paranoid behavior Neg Hx    Seizures Neg Hx    Sexual abuse Neg Hx    Physical abuse Neg Hx     Liver disease Neg Hx     OBJECTIVE:  Vitals:   10/04/20 1231  BP: (!) 150/114  Pulse: 94  Resp: 16  Temp: 98.6 F (37 C)  TempSrc: Oral  SpO2: 99%     General appearance: alert; appears fatigued, but nontoxic, speaking in full sentences and managing own secretions HEENT: NCAT; Ears: EACs clear, TMs pearly gray with visible cone of light, without erythema; Eyes: PERRL, EOMI grossly; Nose: no obvious rhinorrhea; Throat: oropharynx clear, tonsils 1+ and mildly erythematous without white tonsillar exudates, uvula midline; Sinuses: frontal sinuses tender to palpation Neck: supple without LAD Lungs: CTA bilaterally without adventitious breath sounds; cough absent Heart: regular rate and rhythm.  Radial pulses 2+ symmetrical bilaterally Skin: warm and dry Psychological: alert and cooperative; normal mood and affect  LABS: No results found for this or any previous visit (from the past 24 hour(s)).   ASSESSMENT & PLAN:  1. Acute non-recurrent frontal sinusitis   2. Nasal congestion     Meds ordered this encounter  Medications   amoxicillin-clavulanate (AUGMENTIN) 875-125 MG tablet    Sig: Take 1 tablet by mouth 2 (two) times daily for 10 days.    Dispense:  20 tablet    Refill:  0    Order Specific Question:   Supervising Provider    Answer:   Merrilee Jansky [id number]    Acute Sinusitis Push fluids and get rest Prescribed Augmentin 875mg  twice daily for 10 days.   Take as directed and to completion.  Drink warm or cool liquids, use throat lozenges, or popsicles to help alleviate symptoms Take OTC ibuprofen or tylenol as needed for pain May use Zyrtec D and flonase to help alleviate symptoms Follow up with PCP if symptoms persist Return or go to ER if you have any new or worsening symptoms such as fever, chills, nausea, vomiting, worsening sore throat, cough, abdominal pain, chest pain, changes in bowel or bladder habits.   Reviewed expectations re: course of  current medical issues. Questions answered. Outlined signs and symptoms indicating need for more acute intervention. Patient verbalized understanding. After Visit Summary given.          , NP 10/04/20 1357

## 2020-10-04 NOTE — ED Triage Notes (Signed)
Had covid a couple of weeks ago and now he is having sinus drainage

## 2020-10-04 NOTE — Discharge Instructions (Addendum)
I have sent in Augmentin for you to take twice a day for 7 days.  Follow up with this office or with primary care if symptoms are persisting.  Follow up in the ER for high fever, trouble swallowing, trouble breathing, other concerning symptoms.  

## 2020-11-07 ENCOUNTER — Ambulatory Visit: Payer: Medicaid Other | Admitting: Orthopedic Surgery

## 2020-11-14 ENCOUNTER — Other Ambulatory Visit: Payer: Self-pay

## 2020-11-14 ENCOUNTER — Ambulatory Visit (INDEPENDENT_AMBULATORY_CARE_PROVIDER_SITE_OTHER): Payer: Medicaid Other | Admitting: Orthopedic Surgery

## 2020-11-14 ENCOUNTER — Encounter: Payer: Self-pay | Admitting: Orthopedic Surgery

## 2020-11-14 VITALS — BP 170/107 | HR 114 | Ht 65.0 in | Wt 224.0 lb

## 2020-11-14 DIAGNOSIS — Z9889 Other specified postprocedural states: Secondary | ICD-10-CM

## 2020-11-14 NOTE — Progress Notes (Signed)
Chief Complaint  Patient presents with  . Post-op Follow-up    Right knee ACL 06/02/20 improving     Encounter Diagnosis  Name Primary?  . S/P ACL repair right 06/02/20 Yes    Joseph Blair had an ACL reconstruction of his right knee is doing well he is 5 months out  He has full range of motion of the right knee his graft feels excellent in terms of his Lachman test  He is interested in having his left knee revised from a previous failed ACL reconstruction  He is interested in doing that in the summertime so we will see him in late May to set up surgery on the left knee for June after his kids get out of school  On his next visit we will take a new x-ray of the left knee

## 2020-11-18 IMAGING — DX DG CHEST 2V
2 series · 2 of 2 positions shown · non-contrast
Comparison: 07/18/2018

CLINICAL DATA: Chest pain

EXAM:
CHEST - 2 VIEW

[chest pa]
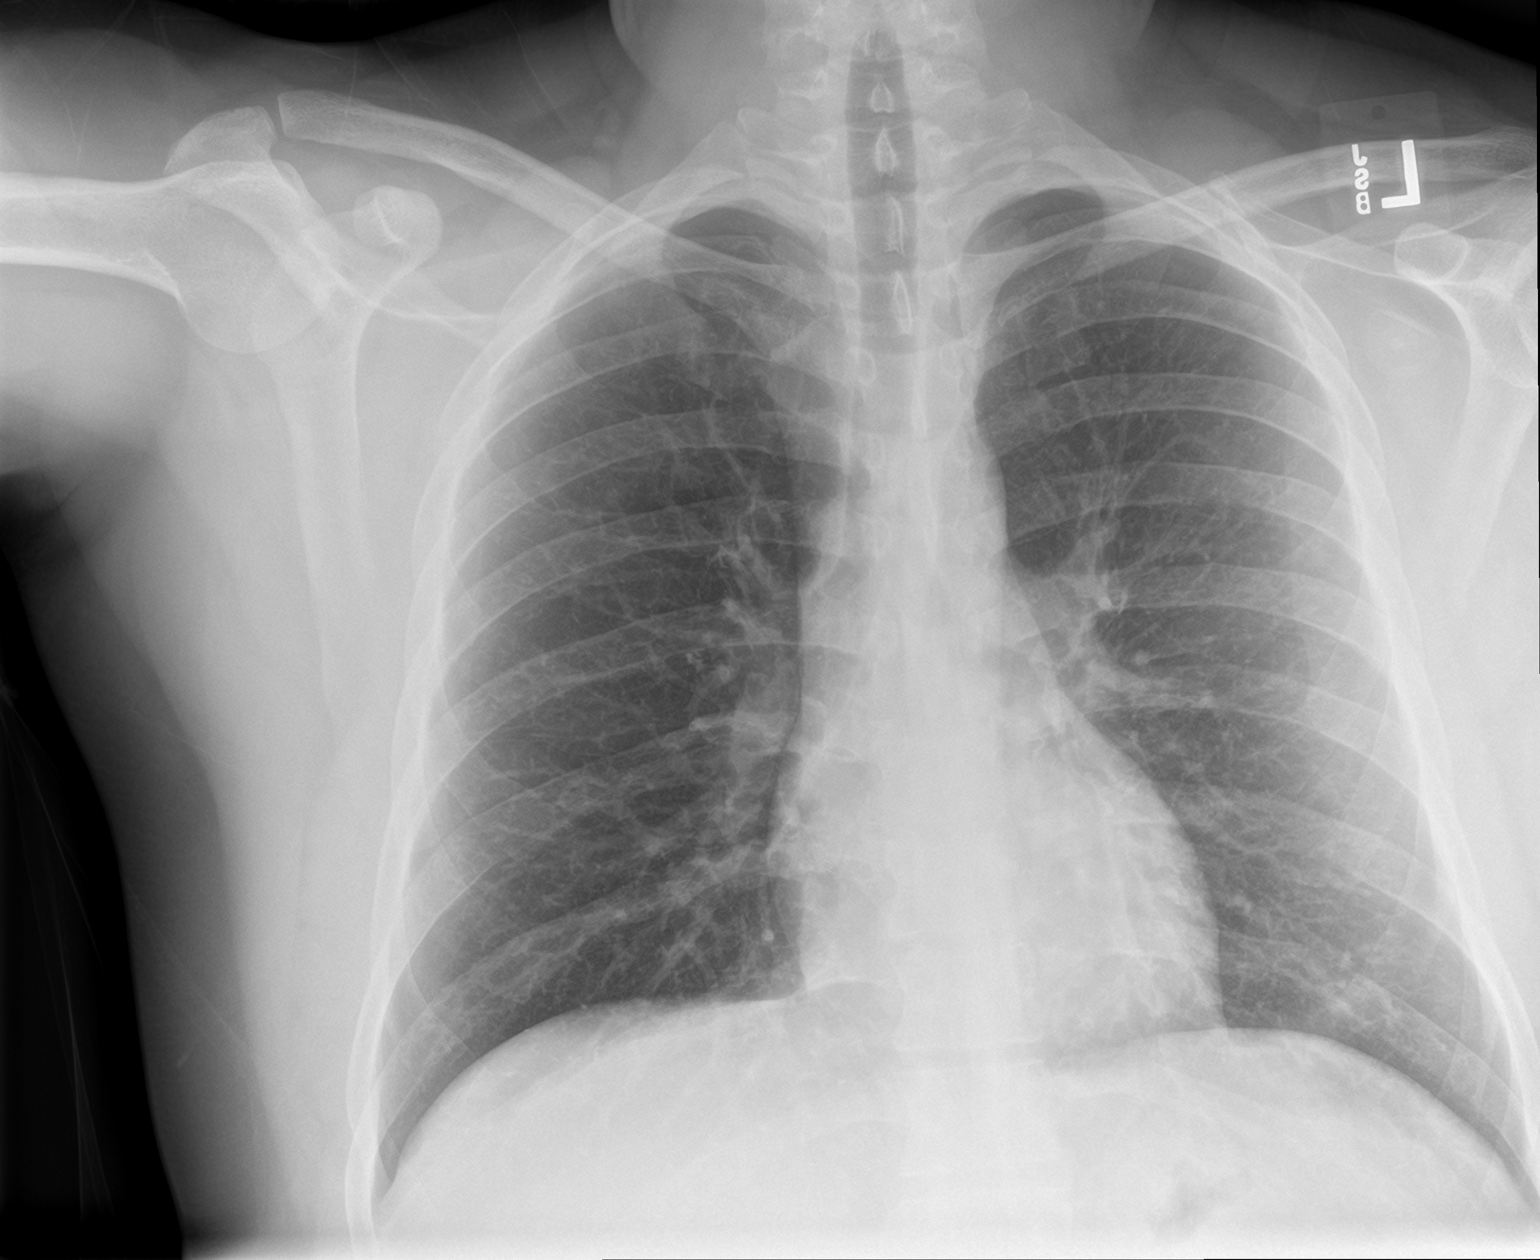

[chest lat]
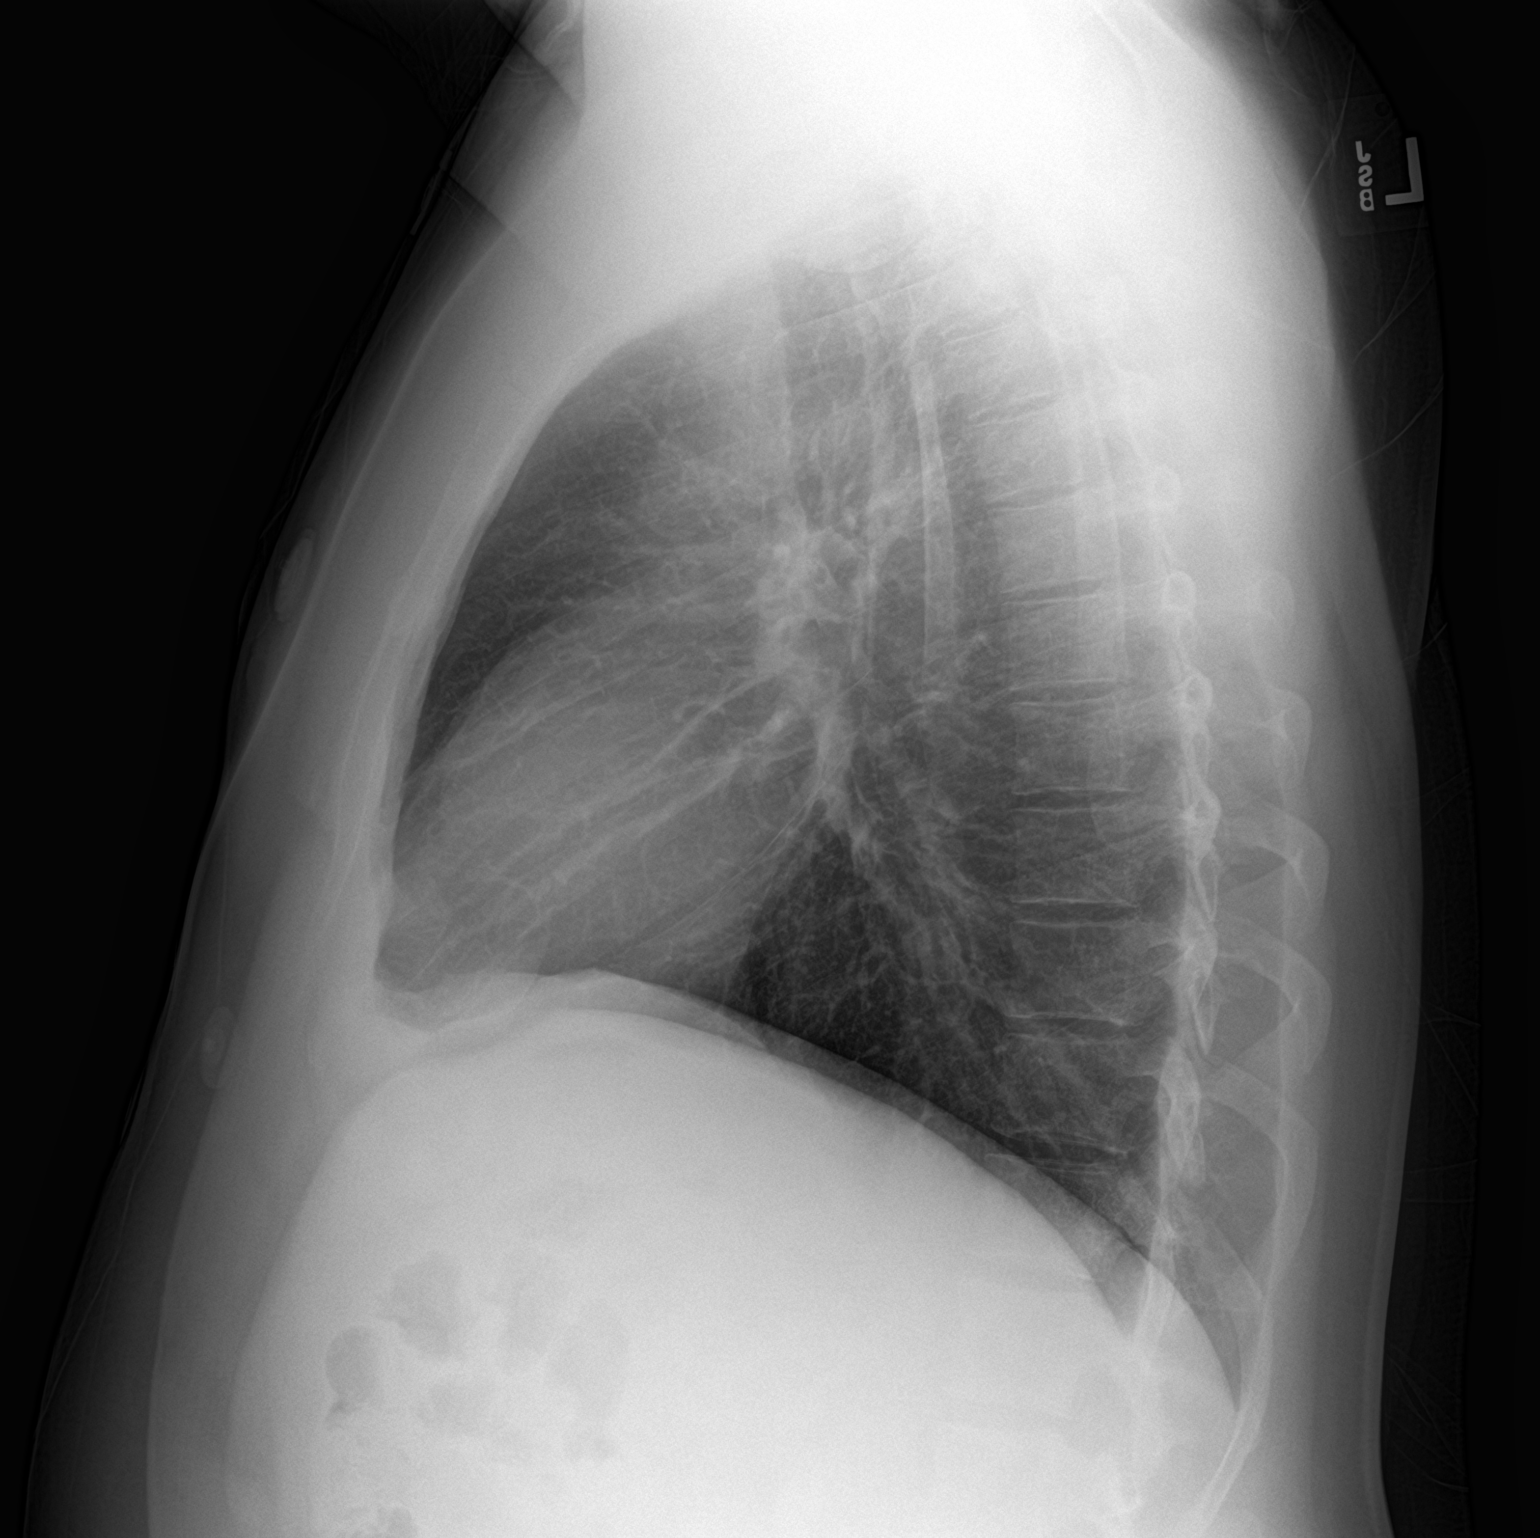

[2 of 2 positions shown; findings below may reference images not displayed]

FINDINGS: The heart size and mediastinal contours are within normal limits.
Both lungs are clear. The visualized skeletal structures are
unremarkable.
IMPRESSION: No active cardiopulmonary disease.

## 2021-03-04 ENCOUNTER — Emergency Department (HOSPITAL_COMMUNITY): Payer: Medicaid Other

## 2021-03-04 ENCOUNTER — Other Ambulatory Visit: Payer: Self-pay

## 2021-03-04 ENCOUNTER — Telehealth: Payer: Self-pay | Admitting: Student in an Organized Health Care Education/Training Program

## 2021-03-04 ENCOUNTER — Encounter (HOSPITAL_COMMUNITY): Payer: Self-pay | Admitting: *Deleted

## 2021-03-04 ENCOUNTER — Observation Stay (HOSPITAL_COMMUNITY)
Admission: EM | Admit: 2021-03-04 | Discharge: 2021-03-05 | Disposition: A | Discharge status: Home / Self Care | Payer: Medicaid Other | Attending: Internal Medicine | Admitting: Internal Medicine

## 2021-03-04 DIAGNOSIS — K219 Gastro-esophageal reflux disease without esophagitis: Secondary | ICD-10-CM

## 2021-03-04 DIAGNOSIS — R7401 Elevation of levels of liver transaminase levels: Secondary | ICD-10-CM | POA: Diagnosis not present

## 2021-03-04 DIAGNOSIS — I10 Essential (primary) hypertension: Secondary | ICD-10-CM | POA: Insufficient documentation

## 2021-03-04 DIAGNOSIS — Z79899 Other long term (current) drug therapy: Secondary | ICD-10-CM | POA: Insufficient documentation

## 2021-03-04 DIAGNOSIS — I309 Acute pericarditis, unspecified: Principal | ICD-10-CM | POA: Insufficient documentation

## 2021-03-04 DIAGNOSIS — F17290 Nicotine dependence, other tobacco product, uncomplicated: Secondary | ICD-10-CM | POA: Insufficient documentation

## 2021-03-04 DIAGNOSIS — R0789 Other chest pain: Secondary | ICD-10-CM

## 2021-03-04 DIAGNOSIS — E876 Hypokalemia: Secondary | ICD-10-CM | POA: Insufficient documentation

## 2021-03-04 DIAGNOSIS — R7989 Other specified abnormal findings of blood chemistry: Secondary | ICD-10-CM

## 2021-03-04 DIAGNOSIS — Z20822 Contact with and (suspected) exposure to covid-19: Secondary | ICD-10-CM | POA: Diagnosis not present

## 2021-03-04 LAB — CBC WITH DIFFERENTIAL/PLATELET
Abs Immature Granulocytes: 0.05 10*3/uL (ref 0.00–0.07)
Basophils Absolute: 0 10*3/uL (ref 0.0–0.1)
Basophils Relative: 0 %
Eosinophils Absolute: 0.2 10*3/uL (ref 0.0–0.5)
Eosinophils Relative: 1 %
HCT: 44.3 % (ref 39.0–52.0)
Hemoglobin: 15.4 g/dL (ref 13.0–17.0)
Immature Granulocytes: 0 %
Lymphocytes Relative: 18 %
Lymphs Abs: 2.3 10*3/uL (ref 0.7–4.0)
MCH: 33 pg (ref 26.0–34.0)
MCHC: 34.8 g/dL (ref 30.0–36.0)
MCV: 95.1 fL (ref 80.0–100.0)
Monocytes Absolute: 1.4 10*3/uL — ABNORMAL HIGH (ref 0.1–1.0)
Monocytes Relative: 11 %
Neutro Abs: 8.6 10*3/uL — ABNORMAL HIGH (ref 1.7–7.7)
Neutrophils Relative %: 70 %
Platelets: 256 10*3/uL (ref 150–400)
RBC: 4.66 MIL/uL (ref 4.22–5.81)
RDW: 12.6 % (ref 11.5–15.5)
WBC: 12.4 10*3/uL — ABNORMAL HIGH (ref 4.0–10.5)
nRBC: 0 % (ref 0.0–0.2)

## 2021-03-04 LAB — COMPREHENSIVE METABOLIC PANEL
ALT: 122 U/L — ABNORMAL HIGH (ref 0–44)
AST: 96 U/L — ABNORMAL HIGH (ref 15–41)
Albumin: 4.4 g/dL (ref 3.5–5.0)
Alkaline Phosphatase: 75 U/L (ref 38–126)
Anion gap: 11 (ref 5–15)
BUN: 13 mg/dL (ref 6–20)
CO2: 24 mmol/L (ref 22–32)
Calcium: 9.4 mg/dL (ref 8.9–10.3)
Chloride: 100 mmol/L (ref 98–111)
Creatinine, Ser: 0.95 mg/dL (ref 0.61–1.24)
GFR, Estimated: >60 mL/min (ref >60)
Glucose, Bld: 114 mg/dL — ABNORMAL HIGH (ref 70–99)
Potassium: 3 mmol/L — ABNORMAL LOW (ref 3.5–5.1)
Sodium: 135 mmol/L (ref 135–145)
Total Bilirubin: 0.7 mg/dL (ref 0.3–1.2)
Total Protein: 8.3 g/dL — ABNORMAL HIGH (ref 6.5–8.1)

## 2021-03-04 LAB — TROPONIN I (HIGH SENSITIVITY)
Troponin I (High Sensitivity): 30 ng/L — ABNORMAL HIGH (ref <18)
Troponin I (High Sensitivity): 42 ng/L — ABNORMAL HIGH (ref <18)

## 2021-03-04 LAB — MAGNESIUM: Magnesium: 1.8 mg/dL (ref 1.7–2.4)

## 2021-03-04 LAB — SEDIMENTATION RATE: Sed Rate: 23 mm/hr — ABNORMAL HIGH (ref 0–16)

## 2021-03-04 LAB — D-DIMER, QUANTITATIVE: D-Dimer, Quant: 1.03 ug/mL-FEU — ABNORMAL HIGH (ref 0.00–0.50)

## 2021-03-04 MED ORDER — LISINOPRIL 10 MG PO TABS
20.0000 mg | ORAL_TABLET | Freq: Every day | ORAL | Status: DC
  Administered 2021-03-05: 20 mg via ORAL
  Filled 2021-03-04: qty 2

## 2021-03-04 MED ORDER — IBUPROFEN 800 MG PO TABS
800.0000 mg | ORAL_TABLET | Freq: Three times a day (TID) | ORAL | Status: DC
Start: 2021-03-04 — End: 2021-03-05
  Administered 2021-03-04 – 2021-03-05 (×2): 800 mg via ORAL
  Filled 2021-03-04 (×2): qty 1

## 2021-03-04 MED ORDER — PANTOPRAZOLE SODIUM 40 MG IV SOLR
40.0000 mg | Freq: Two times a day (BID) | INTRAVENOUS | Status: DC
  Administered 2021-03-04 – 2021-03-05 (×2): 40 mg via INTRAVENOUS
  Filled 2021-03-04 (×2): qty 40

## 2021-03-04 MED ORDER — COLCHICINE 0.6 MG PO TABS
0.6000 mg | ORAL_TABLET | Freq: Once | ORAL | Status: AC
  Administered 2021-03-04: 0.6 mg via ORAL
  Filled 2021-03-04: qty 1

## 2021-03-04 MED ORDER — HYDROMORPHONE HCL 1 MG/ML IJ SOLN
0.5000 mg | Freq: Once | INTRAMUSCULAR | Status: AC
  Administered 2021-03-04: 0.5 mg via INTRAVENOUS
  Filled 2021-03-04: qty 1

## 2021-03-04 MED ORDER — ENOXAPARIN SODIUM 40 MG/0.4ML IJ SOSY
40.0000 mg | PREFILLED_SYRINGE | INTRAMUSCULAR | Status: DC
  Administered 2021-03-04: 40 mg via SUBCUTANEOUS
  Filled 2021-03-04: qty 0.4

## 2021-03-04 MED ORDER — IBUPROFEN 800 MG PO TABS
800.0000 mg | ORAL_TABLET | Freq: Once | ORAL | Status: AC
  Administered 2021-03-04: 800 mg via ORAL
  Filled 2021-03-04: qty 1

## 2021-03-04 MED ORDER — HYDROMORPHONE HCL 1 MG/ML IJ SOLN
0.5000 mg | INTRAMUSCULAR | Status: DC | PRN
  Administered 2021-03-04 – 2021-03-05 (×5): 0.5 mg via INTRAVENOUS
  Filled 2021-03-04 (×5): qty 0.5

## 2021-03-04 MED ORDER — HYDROCHLOROTHIAZIDE 25 MG PO TABS
25.0000 mg | ORAL_TABLET | Freq: Every day | ORAL | Status: DC
  Administered 2021-03-05: 25 mg via ORAL
  Filled 2021-03-04: qty 1

## 2021-03-04 MED ORDER — POTASSIUM CHLORIDE CRYS ER 20 MEQ PO TBCR
40.0000 meq | EXTENDED_RELEASE_TABLET | Freq: Once | ORAL | Status: AC
  Administered 2021-03-04: 40 meq via ORAL
  Filled 2021-03-04: qty 2

## 2021-03-04 MED ORDER — POTASSIUM CHLORIDE CRYS ER 20 MEQ PO TBCR
40.0000 meq | EXTENDED_RELEASE_TABLET | Freq: Two times a day (BID) | ORAL | Status: DC
  Administered 2021-03-04 – 2021-03-05 (×2): 40 meq via ORAL
  Filled 2021-03-04 (×2): qty 2

## 2021-03-04 MED ORDER — IOHEXOL 350 MG/ML SOLN
75.0000 mL | Freq: Once | INTRAVENOUS | Status: AC | PRN
  Administered 2021-03-04: 75 mL via INTRAVENOUS

## 2021-03-04 MED ORDER — COLCHICINE 0.6 MG PO TABS
0.6000 mg | ORAL_TABLET | Freq: Two times a day (BID) | ORAL | Status: DC
  Administered 2021-03-04 – 2021-03-05 (×2): 0.6 mg via ORAL
  Filled 2021-03-04 (×2): qty 1

## 2021-03-04 NOTE — ED Provider Notes (Signed)
[facility]ENCY [facility]PARTMENT Provider Note   CSN: [id number] Arrival date & time: 03/04/21  1507     History Chief Complaint  Patient presents with  . Chest Pain    Joseph Blair is a 30 y.o. male.  Patient complains of chest discomfort.  He states it started today and is similar to the pain he had 5 years ago when he was admitted with for myocarditis.  He had an echo at that time which was unremarkable.  The history is provided by the patient.  Chest Pain Pain location:  L chest Pain quality: aching   Pain radiates to:  Does not radiate Pain severity:  Moderate Onset quality:  Sudden Timing:  Constant Progression:  Waxing and waning Associated symptoms: no abdominal pain, no back pain, no cough, no fatigue and no headache        Past Medical History:  Diagnosis Date  . ADHD (attention deficit hyperactivity disorder)   . GERD (gastroesophageal reflux disease)   . Hypertension   . Pancreas disorder 2013   patient reports he was told he might have pancreatitis. No elevated lipase or imaging to support per EPIC review    Patient Active Problem List   Diagnosis Date Noted  . S/P ACL repair right 06/02/20 06/07/2020  . 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 Derangement of posterior horn of medial meniscus of right knee   . Abnormal LFTs 02/02/2019  . Acute myopericarditis 04/05/2016  . Chest pain 04/05/2016  . Elevated troponin 04/05/2016  . Right ACL tear 11/16/2015  . ACL (anterior cruciate ligament) rupture 05/05/2013  . 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 Intermittent explosive disorder 10/03/2011    Past Surgical History:  Procedure Laterality Date  . ANTERIOR CRUCIATE LIGAMENT REPAIR     Left knee Dr. Edger HouseMcGinley  . ANTERIOR CRUCIATE LIGAMENT REPAIR Right 06/02/2020   Procedure: RIGHT ANTERIOR CRUCIATE LIGAMENT (ACL) REPAIR WITH ALLOGRAFT;  Surgeon: Vickki HearingHarrison, Stanley E, MD;  Location:  [facility];  Service: Orthopedics;  Laterality: Right;  . HERNIA REPAIR    . KNEE ARTHROSCOPY WITH MEDIAL MENISECTOMY Right 06/02/2020   Procedure: RIGHT KNEE ARTHROSCOPY WITH MEDIAL MENISECTOMY;  Surgeon: Vickki HearingHarrison, Stanley E, MD;  Location: [facility];  Service: Orthopedics;  Laterality: Right;       Family History  Problem Relation Age of Onset  . ADD / ADHD Brother   . ADD / ADHD Maternal Aunt   . Drug abuse Maternal Uncle   . Alcohol abuse Maternal Uncle   . Schizophrenia Maternal Uncle   . Colon cancer Paternal Aunt        31 years old  . Anxiety disorder Neg Hx   . Bipolar disorder Neg Hx   . Dementia Neg Hx   . Depression Neg Hx   . OCD Neg Hx   . Paranoid behavior Neg Hx   . Seizures Neg Hx   . Sexual abuse Neg Hx   . Physical abuse Neg Hx   . Liver disease Neg Hx     Social History   Tobacco Use  . Smoking status: Current Some Day Smoker    Types: Cigars  . Smokeless tobacco: Never Used  Substance Use Topics  . Alcohol use: Yes    Comment: prior to 11/2018 was drinking heavy etoh daily, now doesn't drink every day (now pint brown liqour a day)  . Drug use: Not Currently    Types: Marijuana  Home Medications Prior to Admission medications   Medication Sig Start Date End Date Taking? Authorizing Provider  hydrochlorothiazide (HYDRODIURIL) 25 MG tablet Take 25 mg by mouth daily. 02/18/20  Yes [provider]  lisinopril (ZESTRIL) 20 MG tablet Take 20 mg by mouth daily. 01/05/21  Yes [provider]  omeprazole (PRILOSEC) 20 MG capsule Take 20 mg by mouth daily.   Yes [provider]  ibuprofen (ADVIL) 800 MG tablet Take 1 tablet (800 mg total) by mouth every 8 (eight) hours as needed. 06/30/20   Vickki Hearing, MD    Allergies    Bee venom  Review of Systems   Review of Systems  Constitutional: Negative for appetite change and fatigue.  HENT: Negative for congestion, ear discharge and sinus pressure.   Eyes: Negative for discharge.   Respiratory: Negative for cough.   Cardiovascular: Positive for chest pain.  Gastrointestinal: Negative for abdominal pain and diarrhea.  Genitourinary: Negative for frequency and hematuria.  Musculoskeletal: Negative for back pain.  Skin: Negative for rash.  Neurological: Negative for seizures and headaches.  Psychiatric/Behavioral: Negative for hallucinations.    Physical Exam Updated Vital Signs BP 124/83   Pulse 95   Temp 98.3 F (36.8 C) (Oral)   Resp 20   Ht 5\' 6"  (1.676 m)   Wt 99.8 kg   SpO2 99%   BMI 35.51 kg/m   Physical Exam Vitals and nursing note reviewed.  Constitutional:      Appearance: He is well-developed.  HENT:     Head: Normocephalic.     Mouth/Throat:     Mouth: Mucous membranes are moist.  Eyes:     General: No scleral icterus.    Conjunctiva/sclera: Conjunctivae normal.  Neck:     Thyroid: No thyromegaly.  Cardiovascular:     Rate and Rhythm: Normal rate and regular rhythm.     Heart sounds: No murmur heard. No friction rub. No gallop.   Pulmonary:     Breath sounds: No stridor. No wheezing or rales.  Chest:     Chest wall: No tenderness.  Abdominal:     General: There is no distension.     Tenderness: There is no abdominal tenderness. There is no rebound.  Musculoskeletal:        General: Normal range of motion.     Cervical back: Neck supple.  Lymphadenopathy:     Cervical: No cervical adenopathy.  Skin:    Findings: No erythema or rash.  Neurological:     Mental Status: He is alert and oriented to person, place, and time.     Motor: No abnormal muscle tone.     Coordination: Coordination normal.  Psychiatric:        Behavior: Behavior normal.     ED Results / Procedures / Treatments   Labs (all labs ordered are listed, but only abnormal results are displayed) Labs Reviewed  CBC WITH DIFFERENTIAL/PLATELET - Abnormal; Notable for the following components:      Result Value   WBC 12.4 (*)    Neutro Abs 8.6 (*)     Monocytes Absolute 1.4 (*)    All other components within normal limits  COMPREHENSIVE METABOLIC PANEL - Abnormal; Notable for the following components:   Potassium 3.0 (*)    Glucose, Bld 114 (*)    Total Protein 8.3 (*)    AST 96 (*)    ALT 122 (*)    All other components within normal limits  D-DIMER, QUANTITATIVE - Abnormal;  Notable for the following components:   D-Dimer, Quant 1.03 (*)    All other components within normal limits  TROPONIN I (HIGH SENSITIVITY) - Abnormal; Notable for the following components:   Troponin I (High Sensitivity) 30 (*)    All other components within normal limits  TROPONIN I (HIGH SENSITIVITY) - Abnormal; Notable for the following components:   Troponin I (High Sensitivity) 42 (*)    All other components within normal limits  C-REACTIVE PROTEIN  SEDIMENTATION RATE    EKG EKG Interpretation  Date/Time:  Saturday Mar 04 2021 15:21:43 EDT Ventricular Rate:  106 PR Interval:  140 QRS Duration: 82 QT Interval:  346 QTC Calculation: 459 R Axis:   66 Text Interpretation: Sinus tachycardia Otherwise normal ECG Confirmed by Bethann Berkshire [phone]) on 03/04/2021 3:42:43 PM   Radiology CT Angio Chest PE W and/or Wo Contrast  Result Date: 03/04/2021 CLINICAL DATA:  Chest pain, shortness of breath EXAM: CT ANGIOGRAPHY CHEST WITH CONTRAST TECHNIQUE: Multidetector CT imaging of the chest was performed using the standard protocol during bolus administration of intravenous contrast. Multiplanar CT image reconstructions and MIPs were obtained to evaluate the vascular anatomy. CONTRAST:  78mL OMNIPAQUE IOHEXOL 350 MG/ML SOLN COMPARISON:  Same day chest x-ray FINDINGS: Cardiovascular: Slightly suboptimal opacification of the pulmonary arteries secondary to contrast bolus timing. No evidence of filling defect to the lobar branch level to suggest pulmonary embolism. Thoracic aorta is normal in course and caliber. Normal heart size. No pericardial effusion. No evidence  of right heart strain. Mediastinum/Nodes: No enlarged mediastinal, hilar, or axillary lymph nodes. Thyroid gland, trachea, and esophagus demonstrate no significant findings. Lungs/Pleura: 3 mm nodule abutting the minor fissure within the right middle lobe, likely perifissural lymph node (series 6, image 80). Minimal bibasilar subsegmental atelectasis. Lungs are otherwise clear without focal airspace consolidation. No pleural effusion or pneumothorax. Upper Abdomen: No acute abnormality. Musculoskeletal: No chest wall abnormality. No acute or significant osseous findings. Review of the MIP images confirms the above findings. IMPRESSION: 1. Slightly suboptimal opacification of the pulmonary arteries. No evidence of pulmonary embolism to the lobar branch level. 2. No acute intrathoracic process. 3. Probable 3 mm right middle lobe perifissural lymph node. No follow-up needed if patient is low-risk. Non-contrast chest CT can be considered in 12 months if patient is high-risk. This recommendation follows the consensus statement: Guidelines for Management of Incidental Pulmonary Nodules Detected on CT Images: From the Fleischner Society 2017; Radiology 2017; 284:228-243. Electronically Signed   By: Duanne Guess D.O.   On: 03/04/2021 17:13   DG Chest Port 1 View  Result Date: 03/04/2021 CLINICAL DATA:  Left side chest pain EXAM: PORTABLE CHEST 1 VIEW COMPARISON:  06/09/2020 FINDINGS: The heart size and mediastinal contours are within normal limits. Both lungs are clear. The visualized skeletal structures are unremarkable. IMPRESSION: Normal study. Electronically Signed   By: Charlett Nose M.D.   On: 03/04/2021 16:04    Procedures Procedures   Medications Ordered in ED Medications  HYDROmorphone (DILAUDID) injection 0.5 mg (0.5 mg Intravenous Given 03/04/21 1636)  iohexol (OMNIPAQUE) 350 MG/ML injection 75 mL (75 mLs Intravenous Contrast Given 03/04/21 1656)  potassium chloride SA (KLOR-CON) CR tablet 40 mEq  (40 mEq Oral Given 03/04/21 1854)    ED Course  I have reviewed the triage vital signs and the nursing notes.  Pertinent labs & imaging results that were available during my care of the patient were reviewed by me and considered in my medical decision making (see chart  for details).    CRITICAL CARE Performed by: Bethann Berkshire Total critical care time: 40 minutes Critical care time was exclusive of separately billable procedures and treating other patients. Critical care was necessary to treat or prevent imminent or life-threatening deterioration. Critical care was time spent personally by me on the following activities: development of treatment plan with patient and/or surrogate as well as nursing, discussions with consultants, evaluation of patient's response to treatment, examination of patient, obtaining history from patient or surrogate, ordering and performing treatments and interventions, ordering and review of laboratory studies, ordering and review of radiographic studies, pulse oximetry and re-evaluation of patient's condition. Patient with chest pain which could be myocarditis again.  I spoke with Dr. Anselm Jungling who states we should get his CRP sed rate started him on Motrin 800 twice a day and colchicine twice a day 0.5 mg.  He recommending having the hospitalist admit and if they have any questions they can call them at [phone] MDM Rules/Calculators/A&P                          Patient admitted for chest pain and elevated troponins possible myocarditis. Final Clinical Impression(s) / ED Diagnoses Final diagnoses:  Atypical chest pain    Rx / DC Orders ED Discharge Orders    None       Bethann Berkshire, MD 03/04/21 [id number]

## 2021-03-04 NOTE — Telephone Encounter (Signed)
Paged by TC to conference with ED provider at [facility] regarding Mr. Joseph Blair.  He is a 30 year old gentleman who presents with ongoing chest discomfort along with shortness of breath that started this morning and has been constant and unrelenting.  His work-up revealed a mild troponin elevation (hsT 30->42), leukocytosis (WBC 12.4), mildly elevated transaminases (AST/ALT 96/122) and elevated D dimer (1.03).  ECG notable for sinus tachycardia (HR 106) but no signs of ischemia.  He had a similar episode in 2017 is diagnosed with myopericarditis and had an elevated troponin (trop I 1.11->1.68->1.77).  TTE was performed on 04/06/2016 and did not reveal any structural abnormalities.  The symptoms he is experiencing are similar to those in 2017.  CT PE was negative for acute PE.  Given his symptoms along with elevated troponin, sinus tachycardia, leukocytosis, and elevated D-dimer I think the most likely explanation is recurrence of his myopericarditis.  Plan to treat with colchicine and high-dose ibuprofen and obtain an echo in the morning.  If his symptoms resolve and the echo is normal that he can be discharged on ibuprofen/colchicine. They will plan to admit to the general hospitalist service for further evaluation and monitoring.

## 2021-03-04 NOTE — H&P (Signed)
History and Physical  ULICES MAACK MOQ:[id number] DOB: 08/19/1991 DOA: 03/04/2021  Referring physician: Dr Aileen Pilot, ED physician PCP: Avon Gully, MD  Outpatient Specialists:   Patient Coming From: home  Chief Complaint: Chest pain  HPI: Joseph Blair is a 30 y.o. male with a history of myopericarditis approximately 5 years ago, hypertension, GERD.  Patient seen for chest pain that started this morning.  He awoke with the pain.  Pain was in his left chest and under his armpit and radiated down his arm.  No provoking factors.  Pain did not worsen with exertion.  As the pain was not going away, he came to the hospital for evaluation.  His pain did get better with IV pain medicine.  Denies fevers, chills, nausea, vomiting.  Pain does increase slightly with deep inspiration.  Emergency Department Course: High-sensitivity troponin 30 initially, which increased to 42 on repeat 2 hours later.  AST and ALT slightly elevated.  Slightly hypokalemic.  White count 12.4  Review of Systems:   Pt denies any fevers, chills, nausea, vomiting, diarrhea, constipation, abdominal pain, shortness of breath, dyspnea on exertion, orthopnea, cough, wheezing, palpitations, headache, vision changes, lightheadedness, dizziness, melena, rectal bleeding.  Review of systems are otherwise negative  Past Medical History:  Diagnosis Date  . ADHD (attention deficit hyperactivity disorder)   . GERD (gastroesophageal reflux disease)   . Hypertension   . Pancreas disorder 2013   patient reports he was told he might have pancreatitis. No elevated lipase or imaging to support per EPIC review   Past Surgical History:  Procedure Laterality Date  . ANTERIOR CRUCIATE LIGAMENT REPAIR     Left knee Dr. Edger House  . ANTERIOR CRUCIATE LIGAMENT REPAIR Right 06/02/2020   Procedure: RIGHT ANTERIOR CRUCIATE LIGAMENT (ACL) REPAIR WITH ALLOGRAFT;  Surgeon: Vickki Hearing, MD;  Location: [facility];  Service: Orthopedics;   Laterality: Right;  . HERNIA REPAIR    . KNEE ARTHROSCOPY WITH MEDIAL MENISECTOMY Right 06/02/2020   Procedure: RIGHT KNEE ARTHROSCOPY WITH MEDIAL MENISECTOMY;  Surgeon: Vickki Hearing, MD;  Location: [facility];  Service: Orthopedics;  Laterality: Right;   Social History:  reports that he has been smoking cigars. He has never used smokeless tobacco. He reports current alcohol use. He reports previous drug use. Drug: Marijuana. Patient lives at home  Allergies  Allergen Reactions  . Bee Venom Swelling    Family History  Problem Relation Age of Onset  . ADD / ADHD Brother   . ADD / ADHD Maternal Aunt   . Drug abuse Maternal Uncle   . Alcohol abuse Maternal Uncle   . Schizophrenia Maternal Uncle   . Colon cancer Paternal Aunt        40 years old  . Anxiety disorder Neg Hx   . Bipolar disorder Neg Hx   . Dementia Neg Hx   . Depression Neg Hx   . OCD Neg Hx   . Paranoid behavior Neg Hx   . Seizures Neg Hx   . Sexual abuse Neg Hx   . Physical abuse Neg Hx   . Liver disease Neg Hx       Prior to Admission medications   Medication Sig Start Date End Date Taking? Authorizing Provider  hydrochlorothiazide (HYDRODIURIL) 25 MG tablet Take 25 mg by mouth daily. 02/18/20  Yes [provider]  lisinopril (ZESTRIL) 20 MG tablet Take 20 mg by mouth daily. 01/05/21  Yes [provider]  omeprazole (PRILOSEC) 20 MG capsule Take 20 mg  by mouth daily.   Yes [provider]  ibuprofen (ADVIL) 800 MG tablet Take 1 tablet (800 mg total) by mouth every 8 (eight) hours as needed. 06/30/20   Vickki Hearing, MD    Physical Exam: BP 124/83   Pulse 95   Temp 98.3 F (36.8 C) (Oral)   Resp 20   Ht 5\' 6"  (1.676 m)   Wt 99.8 kg   SpO2 99%   BMI 35.51 kg/m   . General: Young male. Awake and alert and oriented x3. No acute cardiopulmonary distress.  HEENT: Normocephalic atraumatic.  Right and left ears normal in appearance.  Pupils equal, round, reactive to light.  Extraocular muscles are intact. Sclerae anicteric and noninjected.  Moist mucosal membranes. No mucosal lesions.  . Neck: Neck supple without lymphadenopathy. No carotid bruits. No masses palpated.  . Cardiovascular: Regular rate with normal S1-S2 sounds. No murmurs, rubs, gallops auscultated. No JVD.  Marland Kitchen Respiratory: Good respiratory effort with no wheezes, rales, rhonchi. Lungs clear to auscultation bilaterally.  No accessory muscle use. . Abdomen: Soft, nontender, nondistended. Active bowel sounds. No masses or hepatosplenomegaly  . Skin: No rashes, lesions, or ulcerations.  Dry, warm to touch. 2+ dorsalis pedis and radial pulses. . Musculoskeletal: No calf or leg pain. All major joints not erythematous nontender.  No upper or lower joint deformation.  Good ROM.  No contractures  . Psychiatric: Intact judgment and insight. Pleasant and cooperative. . Neurologic: No focal neurological deficits. Strength is 5/5 and symmetric in upper and lower extremities.  Cranial nerves II through XII are grossly intact.           Labs on Admission: I have personally reviewed following labs and imaging studies  CBC: Recent Labs  Lab 03/04/21 1551  WBC 12.4*  NEUTROABS 8.6*  HGB 15.4  HCT 44.3  MCV 95.1  PLT 256   Basic Metabolic Panel: Recent Labs  Lab 03/04/21 1551  NA 135  K 3.0*  CL 100  CO2 24  GLUCOSE 114*  BUN 13  CREATININE 0.95  CALCIUM 9.4   GFR: Estimated Creatinine Clearance: 125.8 mL/min (by C-G formula based on SCr of 0.95 mg/dL). Liver Function Tests: Recent Labs  Lab 03/04/21 1551  AST 96*  ALT 122*  ALKPHOS 75  BILITOT 0.7  PROT 8.3*  ALBUMIN 4.4   No results for input(s): LIPASE, AMYLASE in the last 168 hours. No results for input(s): AMMONIA in the last 168 hours. Coagulation Profile: No results for input(s): INR, PROTIME in the last 168 hours. Cardiac Enzymes: No results for input(s): CKTOTAL, CKMB, CKMBINDEX, TROPONINI in the last 168 hours. BNP (last 3  results) No results for input(s): PROBNP in the last 8760 hours. HbA1C: No results for input(s): HGBA1C in the last 72 hours. CBG: No results for input(s): GLUCAP in the last 168 hours. Lipid Profile: No results for input(s): CHOL, HDL, LDLCALC, TRIG, CHOLHDL, LDLDIRECT in the last 72 hours. Thyroid Function Tests: No results for input(s): TSH, T4TOTAL, FREET4, T3FREE, THYROIDAB in the last 72 hours. Anemia Panel: No results for input(s): VITAMINB12, FOLATE, FERRITIN, TIBC, IRON, RETICCTPCT in the last 72 hours. Urine analysis:    Component Value Date/Time   COLORURINE YELLOW 06/29/2016 2252   APPEARANCEUR CLEAR 06/29/2016 2252   LABSPEC 1.020 06/29/2016 2252   PHURINE 6.5 06/29/2016 2252   GLUCOSEU NEGATIVE 06/29/2016 2252   HGBUR TRACE (A) 06/29/2016 2252   BILIRUBINUR NEGATIVE 06/29/2016 2252   KETONESUR NEGATIVE 06/29/2016 2252   PROTEINUR  NEGATIVE 06/29/2016 2252   UROBILINOGEN 0.2 03/02/2014 1930   NITRITE NEGATIVE 06/29/2016 2252   LEUKOCYTESUR NEGATIVE 06/29/2016 2252   Sepsis Labs: @LABRCNTIP (procalcitonin:4,lacticidven:4) )No results found for this or any previous visit (from the past 240 hour(s)).   Radiological Exams on Admission: CT Angio Chest PE W and/or Wo Contrast  Result Date: 03/04/2021 CLINICAL DATA:  Chest pain, shortness of breath EXAM: CT ANGIOGRAPHY CHEST WITH CONTRAST TECHNIQUE: Multidetector CT imaging of the chest was performed using the standard protocol during bolus administration of intravenous contrast. Multiplanar CT image reconstructions and MIPs were obtained to evaluate the vascular anatomy. CONTRAST:  12mL OMNIPAQUE IOHEXOL 350 MG/ML SOLN COMPARISON:  Same day chest x-ray FINDINGS: Cardiovascular: Slightly suboptimal opacification of the pulmonary arteries secondary to contrast bolus timing. No evidence of filling defect to the lobar branch level to suggest pulmonary embolism. Thoracic aorta is normal in course and caliber. Normal heart size. No  pericardial effusion. No evidence of right heart strain. Mediastinum/Nodes: No enlarged mediastinal, hilar, or axillary lymph nodes. Thyroid gland, trachea, and esophagus demonstrate no significant findings. Lungs/Pleura: 3 mm nodule abutting the minor fissure within the right middle lobe, likely perifissural lymph node (series 6, image 80). Minimal bibasilar subsegmental atelectasis. Lungs are otherwise clear without focal airspace consolidation. No pleural effusion or pneumothorax. Upper Abdomen: No acute abnormality. Musculoskeletal: No chest wall abnormality. No acute or significant osseous findings. Review of the MIP images confirms the above findings. IMPRESSION: 1. Slightly suboptimal opacification of the pulmonary arteries. No evidence of pulmonary embolism to the lobar branch level. 2. No acute intrathoracic process. 3. Probable 3 mm right middle lobe perifissural lymph node. No follow-up needed if patient is low-risk. Non-contrast chest CT can be considered in 12 months if patient is high-risk. This recommendation follows the consensus statement: Guidelines for Management of Incidental Pulmonary Nodules Detected on CT Images: From the Fleischner Society 2017; Radiology 2017; 284:228-243. Electronically Signed   By: 05[birth year]1988 D.O.   On: 03/04/2021 17:13   DG Chest Port 1 View  Result Date: 03/04/2021 CLINICAL DATA:  Left side chest pain EXAM: PORTABLE CHEST 1 VIEW COMPARISON:  06/09/2020 FINDINGS: The heart size and mediastinal contours are within normal limits. Both lungs are clear. The visualized skeletal structures are unremarkable. IMPRESSION: Normal study. Electronically Signed   By: 06/11/2020 M.D.   On: 03/04/2021 16:04    EKG: Independently reviewed.  Sinus tachycardia.  No ST changes.  Assessment/Plan: Active Problems:   Acute myopericarditis   GERD (gastroesophageal reflux disease)   Hypertension   Elevated LFTs   Hypokalemia    This patient was discussed with the ED  physician, including pertinent vitals, physical exam findings, labs, and imaging.  We also discussed care given by the ED provider.  1. Acute myopericarditis a. Observation on telemetry b. Repeat troponins c. Echocardiogram in the morning d. Colchicine and high-dose ibuprofen e. As patient has history of GERD, will need aggressive PPI treatment while on NSAIDs 2. Elevated LFTs a. Acute hepatitis panel b. Right upper quadrant ultrasound 3. Hypokalemia a. We will replace potassium b. Check magnesium 4. Hypertension a. Continue home meds 5. GERD  DVT prophylaxis: lovenox Consultants: none Code Status: full Family Communication: none  Disposition Plan: home following eval   03/06/2021, DO

## 2021-03-04 NOTE — ED Triage Notes (Signed)
Pt woke up this am with chest pain; pt states the pain has gotten progressively worse throughout the day; pt states he feels sob

## 2021-03-05 ENCOUNTER — Observation Stay (HOSPITAL_BASED_OUTPATIENT_CLINIC_OR_DEPARTMENT_OTHER): Payer: Medicaid Other

## 2021-03-05 ENCOUNTER — Observation Stay (HOSPITAL_COMMUNITY): Payer: Medicaid Other

## 2021-03-05 ENCOUNTER — Other Ambulatory Visit (HOSPITAL_COMMUNITY): Payer: Self-pay | Admitting: *Deleted

## 2021-03-05 DIAGNOSIS — R778 Other specified abnormalities of plasma proteins: Secondary | ICD-10-CM

## 2021-03-05 DIAGNOSIS — I309 Acute pericarditis, unspecified: Secondary | ICD-10-CM

## 2021-03-05 DIAGNOSIS — R079 Chest pain, unspecified: Secondary | ICD-10-CM

## 2021-03-05 LAB — C-REACTIVE PROTEIN: CRP: 5.6 mg/dL — ABNORMAL HIGH (ref <1.0)

## 2021-03-05 LAB — ECHOCARDIOGRAM COMPLETE
Area-P 1/2: 5.5 cm2
Height: 66 in
S' Lateral: 3.38 cm
Weight: 3530.89 oz

## 2021-03-05 LAB — HEPATITIS PANEL, ACUTE
HCV Ab: NONREACTIVE
Hep A IgM: NONREACTIVE
Hep B C IgM: NONREACTIVE
Hepatitis B Surface Ag: NONREACTIVE

## 2021-03-05 LAB — SARS CORONAVIRUS 2 (TAT 6-24 HRS): SARS Coronavirus 2: NEGATIVE

## 2021-03-05 MED ORDER — COLCHICINE 0.6 MG PO TABS: 0.6000 mg | ORAL_TABLET | Freq: Two times a day (BID) | ORAL | 3 refills | Status: DC

## 2021-03-05 MED ORDER — IBUPROFEN 600 MG PO TABS: 600.0000 mg | ORAL_TABLET | Freq: Three times a day (TID) | ORAL | 0 refills | Status: AC

## 2021-03-05 NOTE — Discharge Summary (Signed)
Physician Discharge Summary  RONDAL VANDEVELDE HYQ:[id number] DOB: [birth year] DOA: 03/04/2021  PCP: Avon Gully, MD  Admit date: 03/04/2021  Discharge date: 03/05/2021  Admitted From:Home  Disposition:  Home  Recommendations for Outpatient Follow-up:  1. Follow up with PCP in 1-2 weeks 2. Follow-up with cardiology which will be scheduled in the next 2 weeks, referral sent 3. Continue on colchicine twice daily as prescribed 4. Continue ibuprofen 3 times daily for 2 weeks as prescribed  Home Health: None  Equipment/Devices: None  Discharge Condition:Stable  CODE STATUS: Full  Diet recommendation: Heart Healthy  Brief/Interim Summary:  Joseph Blair is a 30 y.o. male with a history of myopericarditis approximately 5 years ago, hypertension, GERD.  Patient seen for chest pain that started on the morning of admission.  He was admitted with acute myopericarditis and started on colchicine and high-dose ibuprofen with significant relief noted the following day.  He denies any further chest pain and is eager to go home.  He has had 2D echocardiogram performed with no significant findings.  LVEF 60-65% noted.  He was also noted to have some mild transaminitis, but acute hepatitis panel and right upper quadrant ultrasound unremarkable except for some hepatic steatosis.  He is in stable condition for discharge of medications as noted above with close follow-up to cardiology.  He also is compliant with following up with his PCP and I have encouraged him to see his PCP in the next 1 week.  Discharge Diagnoses:  Active Problems:   Acute myopericarditis   GERD (gastroesophageal reflux disease)   Hypertension   Elevated LFTs   Hypokalemia  Principal discharge diagnosis: Acute myopericarditis.  Discharge Instructions  Discharge Instructions    Ambulatory referral to Cardiology   Complete by: As directed    Myopericarditis follow up.   Diet - low sodium heart healthy   Complete by:  As directed    Increase activity slowly   Complete by: As directed      Allergies as of 03/05/2021      Reactions   Bee Venom Swelling      Medication List    TAKE these medications   colchicine 0.6 MG tablet Take 1 tablet (0.6 mg total) by mouth 2 (two) times daily.   hydrochlorothiazide 25 MG tablet Commonly known as: HYDRODIURIL Take 25 mg by mouth daily.   ibuprofen 600 MG tablet Commonly known as: ADVIL Take 1 tablet (600 mg total) by mouth 3 (three) times daily for 14 days. What changed:   medication strength  how much to take  when to take this  reasons to take this   lisinopril 20 MG tablet Commonly known as: ZESTRIL Take 20 mg by mouth daily.   omeprazole 20 MG capsule Commonly known as: PRILOSEC Take 20 mg by mouth daily.       Follow-up Information    Avon Gully, MD. Schedule an appointment as soon as possible for a visit in 1 week(s).   Specialty: Internal Medicine Contact information: [address] [place] [address] [phone]        [facility] Follow up in 2 week(s).   Specialty: Cardiology Contact information: [address] [place] [address] [phone]             Allergies  Allergen Reactions  . Bee Venom Swelling    Consultations:  None   Procedures/Studies: CT Angio Chest PE W and/or Wo Contrast  Result Date: 03/04/2021 CLINICAL DATA:  Chest pain,  shortness of breath EXAM: CT ANGIOGRAPHY CHEST WITH CONTRAST TECHNIQUE: Multidetector CT imaging of the chest was performed using the standard protocol during bolus administration of intravenous contrast. Multiplanar CT image reconstructions and MIPs were obtained to evaluate the vascular anatomy. CONTRAST:  82mL OMNIPAQUE IOHEXOL 350 MG/ML SOLN COMPARISON:  Same day chest x-ray FINDINGS: Cardiovascular: Slightly suboptimal opacification of the pulmonary arteries secondary to contrast bolus timing. No evidence of filling defect  to the lobar branch level to suggest pulmonary embolism. Thoracic aorta is normal in course and caliber. Normal heart size. No pericardial effusion. No evidence of right heart strain. Mediastinum/Nodes: No enlarged mediastinal, hilar, or axillary lymph nodes. Thyroid gland, trachea, and esophagus demonstrate no significant findings. Lungs/Pleura: 3 mm nodule abutting the minor fissure within the right middle lobe, likely perifissural lymph node (series 6, image 80). Minimal bibasilar subsegmental atelectasis. Lungs are otherwise clear without focal airspace consolidation. No pleural effusion or pneumothorax. Upper Abdomen: No acute abnormality. Musculoskeletal: No chest wall abnormality. No acute or significant osseous findings. Review of the MIP images confirms the above findings. IMPRESSION: 1. Slightly suboptimal opacification of the pulmonary arteries. No evidence of pulmonary embolism to the lobar branch level. 2. No acute intrathoracic process. 3. Probable 3 mm right middle lobe perifissural lymph node. No follow-up needed if patient is low-risk. Non-contrast chest CT can be considered in 12 months if patient is high-risk. This recommendation follows the consensus statement: Guidelines for Management of Incidental Pulmonary Nodules Detected on CT Images: From the Fleischner Society 2017; Radiology 2017; 284:228-243. Electronically Signed   By: Duanne Guess D.O.   On: 03/04/2021 17:13   DG Chest Port 1 View  Result Date: 03/04/2021 CLINICAL DATA:  Left side chest pain EXAM: PORTABLE CHEST 1 VIEW COMPARISON:  06/09/2020 FINDINGS: The heart size and mediastinal contours are within normal limits. Both lungs are clear. The visualized skeletal structures are unremarkable. IMPRESSION: Normal study. Electronically Signed   By: Charlett Nose M.D.   On: 03/04/2021 16:04   ECHOCARDIOGRAM COMPLETE  Result Date: 03/05/2021    ECHOCARDIOGRAM REPORT   Patient Name:   Joseph Blair Date of Exam: 03/05/2021  Medical Rec #:  [id number]          Height:       66.0 in Accession #:    [id number]         Weight:       220.7 lb Date of Birth:  [birth year]         BSA:          2.085 m Patient Age:    29 years           BP:           120/82 mmHg Patient Gender: M                  HR:           75 bpm. Exam Location:  Inpatient Procedure: 2D Echo, Cardiac Doppler, Color Doppler and Strain Analysis Indications:    Elevated Troponin  History:        Patient has prior history of Echocardiogram examinations, most                 recent 04/06/2016. History of myopericarditis approximately 5                 years ago, hypertension, GERD.  Sonographer:    Leta Jungling RDCS Referring Phys: [phone] JACOB J STINSON IMPRESSIONS  1.  Left ventricular ejection fraction, by estimation, is 60 to 65%. The left ventricle has normal function. The left ventricle has no regional wall motion abnormalities. Left ventricular diastolic parameters were normal. The average left ventricular global longitudinal strain is -21.7 %. The global longitudinal strain is normal.  2. Right ventricular systolic function is normal. The right ventricular size is normal.  3. The mitral valve is normal in structure. No evidence of mitral valve regurgitation. No evidence of mitral stenosis.  4. The aortic valve has an indeterminant number of cusps. Aortic valve regurgitation is not visualized. No aortic stenosis is present.  5. The inferior vena cava is normal in size with greater than 50% respiratory variability, suggesting right atrial pressure of 3 mmHg. FINDINGS  Left Ventricle: Left ventricular ejection fraction, by estimation, is 60 to 65%. The left ventricle has normal function. The left ventricle has no regional wall motion abnormalities. The average left ventricular global longitudinal strain is -21.7 %. The global longitudinal strain is normal. The left ventricular internal cavity size was normal in size. There is no left ventricular hypertrophy. Left ventricular  diastolic parameters were normal. Right Ventricle: The right ventricular size is normal. No increase in right ventricular wall thickness. Right ventricular systolic function is normal. Left Atrium: Left atrial size was normal in size. Right Atrium: Right atrial size was normal in size. Pericardium: There is no evidence of pericardial effusion. Mitral Valve: The mitral valve is normal in structure. No evidence of mitral valve regurgitation. No evidence of mitral valve stenosis. Tricuspid Valve: The tricuspid valve is normal in structure. Tricuspid valve regurgitation is trivial. No evidence of tricuspid stenosis. Aortic Valve: The aortic valve has an indeterminant number of cusps. Aortic valve regurgitation is not visualized. No aortic stenosis is present. Pulmonic Valve: The pulmonic valve was not well visualized. Pulmonic valve regurgitation is not visualized. No evidence of pulmonic stenosis. Aorta: The aortic root is normal in size and structure. Pulmonary Artery: Indeterminant PASP, inadequate TR jet. Venous: The inferior vena cava is normal in size with greater than 50% respiratory variability, suggesting right atrial pressure of 3 mmHg. IAS/Shunts: No atrial level shunt detected by color flow Doppler.  LEFT VENTRICLE PLAX 2D LVIDd:         4.96 cm  Diastology LVIDs:         3.38 cm  LV e' medial:    10.20 cm/s LV PW:         0.95 cm  LV E/e' medial:  8.5 LV IVS:        1.03 cm  LV e' lateral:   11.00 cm/s LVOT diam:     1.90 cm  LV E/e' lateral: 7.9 LV SV:         54 LV SV Index:   26       2D Longitudinal Strain LVOT Area:     2.84 cm 2D Strain GLS Avg:     -21.7 %  RIGHT VENTRICLE RV S prime:     12.80 cm/s TAPSE (M-mode): 1.9 cm LEFT ATRIUM           Index       RIGHT ATRIUM           Index LA diam:      3.90 cm 1.87 cm/m  RA Area:     13.70 cm LA Vol (A4C): 57.7 ml 27.67 ml/m RA Volume:   33.70 ml  16.16 ml/m  AORTIC VALVE LVOT Vmax:   102.00 cm/s LVOT Vmean:  76.800 cm/s LVOT VTI:    0.189 m  AORTA  Ao Root diam: 2.30 cm Ao Asc diam:  2.80 cm MITRAL VALVE MV Area (PHT): 5.50 cm    SHUNTS MV Decel Time: 138 msec    Systemic VTI:  0.19 m MV E velocity: 87.00 cm/s  Systemic Diam: 1.90 cm MV A velocity: 66.60 cm/s MV E/A ratio:  1.31 Dina RichJonathan Branch MD Electronically signed by Dina RichJonathan Branch MD Signature Date/Time: 03/05/2021/3:20:57 PM    Final    US Abdomen Limited RUQ (LIVER/GB)  Result Date: 03/05/2021 CLINICAL DATA:  30 year old male with elevated LFTs. EXAM: ULTRASOUND ABDOMEN LIMITED RIGHT UPPER QUADRANT COMPARISON:  05/09/2012 CT FINDINGS: Gallbladder: The gallbladder is unremarkable. There is no evidence of cholelithiasis or acute cholecystitis. Common bile duct: Diameter: 3.6 mm. No intrahepatic or extrahepatic biliary dilatation. Liver: Mild diffuse increased echogenicity of the liver is compatible with hepatic steatosis. No focal hepatic abnormalities are identified. Portal vein is patent on color Doppler imaging with normal direction of blood flow towards the liver. Other: None. IMPRESSION: 1. Hepatic steatosis. 2. Unremarkable gallbladder.  No biliary dilatation. Electronically Signed   By: Harmon PierJeffrey  Hu M.D.   On: 03/05/2021 11:12      Discharge Exam: Vitals:   03/05/21 0627 03/05/21 1500  BP: 120/82 124/90  Pulse: 75 81  Resp: 20 17  Temp: 97.8 F (36.6 C) 97.8 F (36.6 C)  SpO2: 98% 99%   Vitals:   03/04/21 2227 03/05/21 0134 03/05/21 0627 03/05/21 1500  BP:  109/79 120/82 124/90  Pulse:  78 75 81  Resp:  20 20 17   Temp:  97.7 F (36.5 C) 97.8 F (36.6 C) 97.8 F (36.6 C)  TempSrc:  Oral Oral Oral  SpO2: 98% 97% 98% 99%  Weight:      Height:        General: Pt is alert, awake, not in acute distress Cardiovascular: RRR, S1/S2 +, no rubs, no gallops Respiratory: CTA bilaterally, no wheezing, no rhonchi Abdominal: Soft, NT, ND, bowel sounds + Extremities: no edema, no cyanosis    The results of significant diagnostics from this hospitalization (including  imaging, microbiology, ancillary and laboratory) are listed below for reference.     Microbiology: No results found for this or any previous visit (from the past 240 hour(s)).   Labs: BNP (last 3 results) No results for input(s): BNP in the last 8760 hours. Basic Metabolic Panel: Recent Labs  Lab 03/04/21 1551 03/04/21 1718  NA 135  --   K 3.0*  --   CL 100  --   CO2 24  --   GLUCOSE 114*  --   BUN 13  --   CREATININE 0.95  --   CALCIUM 9.4  --   MG  --  1.8   Liver Function Tests: Recent Labs  Lab 03/04/21 1551  AST 96*  ALT 122*  ALKPHOS 75  BILITOT 0.7  PROT 8.3*  ALBUMIN 4.4   No results for input(s): LIPASE, AMYLASE in the last 168 hours. No results for input(s): AMMONIA in the last 168 hours. CBC: Recent Labs  Lab 03/04/21 1551  WBC 12.4*  NEUTROABS 8.6*  HGB 15.4  HCT 44.3  MCV 95.1  PLT 256   Cardiac Enzymes: No results for input(s): CKTOTAL, CKMB, CKMBINDEX, TROPONINI in the last 168 hours. BNP: Invalid input(s): POCBNP CBG: No results for input(s): GLUCAP in the last 168 hours. D-Dimer Recent Labs    03/04/21 1551  DDIMER 1.03*  Hgb A1c No results for input(s): HGBA1C in the last 72 hours. Lipid Profile No results for input(s): CHOL, HDL, LDLCALC, TRIG, CHOLHDL, LDLDIRECT in the last 72 hours. Thyroid function studies No results for input(s): TSH, T4TOTAL, T3FREE, THYROIDAB in the last 72 hours.  Invalid input(s): FREET3 Anemia work up No results for input(s): VITAMINB12, FOLATE, FERRITIN, TIBC, IRON, RETICCTPCT in the last 72 hours. Urinalysis    Component Value Date/Time   COLORURINE YELLOW 06/29/2016 2252   APPEARANCEUR CLEAR 06/29/2016 2252   LABSPEC 1.020 06/29/2016 2252   PHURINE 6.5 06/29/2016 2252   GLUCOSEU NEGATIVE 06/29/2016 2252   HGBUR TRACE (A) 06/29/2016 2252   BILIRUBINUR NEGATIVE 06/29/2016 2252   KETONESUR NEGATIVE 06/29/2016 2252   PROTEINUR NEGATIVE 06/29/2016 2252   UROBILINOGEN 0.2 03/02/2014 1930    NITRITE NEGATIVE 06/29/2016 2252   LEUKOCYTESUR NEGATIVE 06/29/2016 2252   Sepsis Labs Invalid input(s): PROCALCITONIN,  WBC,  LACTICIDVEN Microbiology No results found for this or any previous visit (from the past 240 hour(s)).   Time coordinating discharge: 35 minutes  SIGNED:   Erick Blinks, DO [facility] 03/05/2021, 3:39 PM  If 7PM-7AM, please contact night-coverage [email or web address]

## 2021-03-05 NOTE — Progress Notes (Signed)
  Echocardiogram 2D Echocardiogram with strain has been performed.  Leta Jungling M 03/05/2021, 1:52 PM

## 2021-03-13 ENCOUNTER — Ambulatory Visit: Payer: Medicaid Other | Admitting: Orthopedic Surgery

## 2021-03-16 ENCOUNTER — Ambulatory Visit: Payer: Medicaid Other | Admitting: Orthopedic Surgery

## 2021-03-27 ENCOUNTER — Encounter: Payer: Self-pay | Admitting: Orthopedic Surgery

## 2021-03-27 ENCOUNTER — Other Ambulatory Visit: Payer: Self-pay

## 2021-03-27 ENCOUNTER — Ambulatory Visit: Payer: Medicaid Other

## 2021-03-27 ENCOUNTER — Ambulatory Visit: Payer: Medicaid Other | Admitting: Orthopedic Surgery

## 2021-03-27 VITALS — BP 147/105 | HR 87 | Ht 65.0 in | Wt 225.6 lb

## 2021-03-27 DIAGNOSIS — I10 Essential (primary) hypertension: Secondary | ICD-10-CM | POA: Diagnosis not present

## 2021-03-27 DIAGNOSIS — M238X2 Other internal derangements of left knee: Secondary | ICD-10-CM | POA: Diagnosis not present

## 2021-03-27 NOTE — Patient Instructions (Signed)
AFTER THE CARDIOLOGY APPT, CALL THIS OFFICE

## 2021-03-27 NOTE — Progress Notes (Signed)
No chief complaint on file.

## 2021-03-27 NOTE — Progress Notes (Signed)
.    Chief Complaint  Patient presents with   Left knee problem    Previous ACL reconstruction failed    Joseph Blair is 30 years old he had a recent bout of pericarditis he is to see a cardiologist soon  He has left knee instability and pain  He is considering left knee revision surgery for ACL tear  X-rays show 2 [place] screws 1 is really posterior the one is really anterior  He has laxity on exam  Recommend he get his cardiology consult and then we will probably need to do a new MRI of his knee

## 2021-04-26 ENCOUNTER — Telehealth: Payer: Self-pay | Admitting: Radiology

## 2021-04-26 NOTE — Telephone Encounter (Signed)
  Future Appointments  Encounter Information   Provider Department Center  05/04/2021 3:45 PM Wendall Stade, MD     Left message for patient to call back and let me know the appointment is ok or not.  We went ahead and scheduled for him since Cardiology not calling him back.

## 2021-04-28 NOTE — Telephone Encounter (Signed)
Has not called back He has cardiology appointment Can you call him next week to let him know?

## 2021-05-01 NOTE — Telephone Encounter (Signed)
I called, NA, and mailbox is full, cannot leave message.

## 2021-05-02 NOTE — Telephone Encounter (Signed)
I called patient and he is aware of appt and will go to appt.

## 2021-05-04 ENCOUNTER — Ambulatory Visit: Payer: Medicaid Other | Admitting: Cardiovascular Disease

## 2021-05-10 NOTE — Progress Notes (Signed)
CARDIOLOGY CONSULT NOTE       Patient ID: Joseph Blair MRN: [id number] DOB/AGE: 1991-09-27 30 y.o.  Admit date: (Not on file) Referring Physician: Aline Brochure Primary Physician: Rosita Fire, MD Primary Cardiologist: New Reason for Consultation: Preoperative Clearance   Active Problems:   * No active hospital problems. *   HPI:  30 y.o. referred by Dr Aline Brochure for preoperative clearance History of myopericarditis  5 years ago . History of HTN, GERD, ADHD  Seen in hospital 03/05/21 with ? Recurrence SSCP radiating to arms not worse with exertion He had troponin only 30-> 42 no spike/trend Mildly  Elevated LfTls with negative hepatitis panel and negative RUQ [place]  CRP elevated 5.6 and ESR only 23  He has been COVID negative on testing multiple times He smokes and drinks regularly and uses marijuana Rx iv pain meds then colchicine and high dose ibuprofen   TTE done 03/05/21 sohwed EF 60-65% no effusion no valve disease ECG 03/06/21 did not show pericarditis SR rate 107 normal no J point elevation or PR depression   He has had multiple left knee procedures for meniscal tears and torn ACL Xray 03/27/21 Showed screw hardware out of alignment and needs revision   Has not had any cardiac issues since d/c May. No chest pain , palpitations, syncope or dyspnea He raises exotic dogs. Has cut back his ETOH (cognac) quite a bit and only smoking 2 black and milds/day   ROS All other systems reviewed and negative except as noted above  Past Medical History:  Diagnosis Date   ADHD (attention deficit hyperactivity disorder)    GERD (gastroesophageal reflux disease)    Hypertension    Pancreas disorder 2013   patient reports he was told he might have pancreatitis. No elevated lipase or imaging to support per EPIC review    Family History  Problem Relation Age of Onset   ADD / ADHD Brother    ADD / ADHD Maternal Aunt    Drug abuse Maternal Uncle    Alcohol abuse Maternal Uncle     Schizophrenia Maternal Uncle    Colon cancer Paternal Aunt        70 years old   Anxiety disorder Neg Hx    Bipolar disorder Neg Hx    Dementia Neg Hx    Depression Neg Hx    OCD Neg Hx    Paranoid behavior Neg Hx    Seizures Neg Hx    Sexual abuse Neg Hx    Physical abuse Neg Hx    Liver disease Neg Hx     Social History   Socioeconomic History   Marital status: Married    Spouse name: Not on file   Number of children: Not on file   Years of education: Not on file   Highest education level: Not on file  Occupational History   Not on file  Tobacco Use   Smoking status: Some Days    Types: Cigars   Smokeless tobacco: Never  Substance and Sexual Activity   Alcohol use: Yes    Comment: prior to 11/2018 was drinking heavy etoh daily, now doesn't drink every day (now pint brown liqour a day)   Drug use: Not Currently    Types: Marijuana   Sexual activity: Yes  Other Topics Concern   Not on file  Social History Narrative   Not on file   Social Determinants of Health   Financial Resource Strain: Not on file  Food Insecurity: Not on  file  Transportation Needs: Not on file  Physical Activity: Not on file  Stress: Not on file  Social Connections: Not on file  Intimate Partner Violence: Not on file    Past Surgical History:  Procedure Laterality Date   ANTERIOR CRUCIATE LIGAMENT REPAIR     Left knee Dr. Garfield Cornea   ANTERIOR CRUCIATE LIGAMENT REPAIR Right 06/02/2020   Procedure: RIGHT ANTERIOR CRUCIATE LIGAMENT (ACL) REPAIR WITH ALLOGRAFT;  Surgeon: Carole Civil, MD;  Location: [facility];  Service: Orthopedics;  Laterality: Right;   HERNIA REPAIR     KNEE ARTHROSCOPY WITH MEDIAL MENISECTOMY Right 06/02/2020   Procedure: RIGHT KNEE ARTHROSCOPY WITH MEDIAL MENISECTOMY;  Surgeon: Carole Civil, MD;  Location: [facility];  Service: Orthopedics;  Laterality: Right;      Current Outpatient Medications:    colchicine 0.6 MG tablet, Take 1 tablet (0.6 mg total) by mouth 2  (two) times daily., Disp: 60 tablet, Rfl: 3   hydrochlorothiazide (HYDRODIURIL) 25 MG tablet, Take 25 mg by mouth daily., Disp: , Rfl:    lisinopril (ZESTRIL) 20 MG tablet, Take 20 mg by mouth daily., Disp: , Rfl:    omeprazole (PRILOSEC) 20 MG capsule, Take 20 mg by mouth daily., Disp: , Rfl:     Physical Exam: There were no vitals taken for this visit.    Affect appropriate Healthy:  appears stated age 89: normal Neck supple with no adenopathy JVP normal no bruits no thyromegaly Lungs clear with no wheezing and good diaphragmatic motion Heart:  S1/S2 2/6 SEM murmur, no rub, gallop or click PMI normal Abdomen: benighn, BS positve, no tenderness, no AAA no bruit.  No HSM or HJR Distal pulses intact with no bruits No edema Neuro non-focal Skin warm and dry Previous bilateral ACL repairs left knee laxity    Labs:   Lab Results  Component Value Date   WBC 12.4 (H) 03/04/2021   HGB 15.4 03/04/2021   HCT 44.3 03/04/2021   MCV 95.1 03/04/2021   PLT 256 03/04/2021   No results for input(s): NA, K, CL, CO2, BUN, CREATININE, CALCIUM, PROT, BILITOT, ALKPHOS, ALT, AST, GLUCOSE in the last 168 hours.  Invalid input(s): LABALBU Lab Results  Component Value Date   WCBJSEG 315 04/05/2016   TROPONINI <0.03 07/18/2018   No results found for: CHOL No results found for: HDL No results found for: LDLCALC No results found for: TRIG No results found for: CHOLHDL No results found for: LDLDIRECT    Radiology: No results found.  EKG: See HPI   ASSESSMENT AND PLAN:   Chest Pain/Pericarditis:  diagnosis not 100% clear. Echo and ECG did not support Minimal bump in troponin with no trend Minimally elevated ESR. Pain did respond to NSAI's and colchicine Currently asymptomatic active and stable  Pre- Operative :  clear to have any knee surgery or scope He is extremely low risk for any complications regarding his heart  HTN:  continue Zestril and diuretic  GERD:  on prilosec f/u with  primary   F/U in a year   Signed: Jenkins Rouge 05/10/2021, 1:01 PM

## 2021-05-16 ENCOUNTER — Encounter: Payer: Self-pay | Admitting: Cardiovascular Disease

## 2021-05-16 ENCOUNTER — Other Ambulatory Visit: Payer: Self-pay

## 2021-05-16 ENCOUNTER — Ambulatory Visit (INDEPENDENT_AMBULATORY_CARE_PROVIDER_SITE_OTHER): Payer: Medicaid Other | Admitting: Cardiovascular Disease

## 2021-05-16 VITALS — BP 138/78 | HR 90 | Ht 64.0 in | Wt 230.0 lb

## 2021-05-16 DIAGNOSIS — Z01810 Encounter for preprocedural cardiovascular examination: Secondary | ICD-10-CM

## 2021-05-16 DIAGNOSIS — I10 Essential (primary) hypertension: Secondary | ICD-10-CM

## 2021-05-16 DIAGNOSIS — I319 Disease of pericardium, unspecified: Secondary | ICD-10-CM | POA: Diagnosis not present

## 2021-05-16 NOTE — Patient Instructions (Signed)
Medication Instructions:  Your physician recommends that you continue on your current medications as directed. Please refer to the Current Medication list given to you today.  You have been cleared for surgery from a cardiac standpoint   *If you need a refill on your cardiac medications before your next appointment, please call your pharmacy*   Lab Work: NONE   If you have labs (blood work) drawn today and your tests are completely normal, you will receive your results only by: MyChart Message (if you have MyChart) OR A paper copy in the mail If you have any lab test that is abnormal or we need to change your treatment, we will call you to review the results.   Testing/Procedures: NONE    Follow-Up: At [facility], you and your health needs are our priority.  As part of our continuing mission to provide you with exceptional heart care, we have created designated Provider Care Teams.  These Care Teams include your primary Cardiologist (physician) and Advanced Practice Providers (APPs -  Physician Assistants and Nurse Practitioners) who all work together to provide you with the care you need, when you need it.  We recommend signing up for the patient portal called "MyChart".  Sign up information is provided on this After Visit Summary.  MyChart is used to connect with patients for Virtual Visits (Telemedicine).  Patients are able to view lab/test results, encounter notes, upcoming appointments, etc.  Non-urgent messages can be sent to your provider as well.   To learn more about what you can do with MyChart, go to [email or web address].    Your next appointment:   1 year(s)  The format for your next appointment:   In Person  Provider:   Charlton Haws, MD   Other Instructions Thank you for choosing [facility]!

## 2021-08-03 ENCOUNTER — Ambulatory Visit: Payer: Medicaid Other | Admitting: Orthopedic Surgery

## 2021-08-03 NOTE — Progress Notes (Deleted)
Encounter Diagnoses  Name Primary?   ACL laxity, left Yes   S/P ACL repair right 06/02/20     Preop appointment patient has obtained cardiac clearance for ACL repair left knee

## 2021-08-07 ENCOUNTER — Other Ambulatory Visit: Payer: Self-pay

## 2021-08-07 ENCOUNTER — Ambulatory Visit: Payer: Medicaid Other | Admitting: Orthopedic Surgery

## 2021-08-07 ENCOUNTER — Encounter: Payer: Self-pay | Admitting: Orthopedic Surgery

## 2021-08-07 VITALS — BP 147/104 | HR 100 | Ht 64.0 in | Wt 230.0 lb

## 2021-08-07 DIAGNOSIS — M238X2 Other internal derangements of left knee: Secondary | ICD-10-CM

## 2021-08-07 DIAGNOSIS — Z9889 Other specified postprocedural states: Secondary | ICD-10-CM

## 2021-08-07 NOTE — Progress Notes (Signed)
Chief Complaint  Patient presents with   Knee Pain    Left/ discuss surgery states ready to schedule now that he has cardiac clearance     Encounter Diagnoses  Name Primary?   ACL laxity, left Yes   S/P ACL repair     Trial he had a right ACL reconstruction is doing well he has some pain when the weather is cold but otherwise no functional instability  His left knee continues to give way and cause pain he had a primary ACL reconstruction back in 2010  He knows a failed ACL reconstruction he did have his cardiology clearance  We need to do a CT scan to evaluate tunnel position and plan surgical treatment for ACL revision reconstruction  Planning to use an allograft  I have advised him that we will have to do the surgery differently than the one we did before  We will see him back after the CT scan to set the date for surgery

## 2021-08-07 NOTE — Patient Instructions (Signed)
While we are working on your approval for MRI please go ahead and call to schedule your appointment with [facility] within at least one (1) week.   Central Scheduling [phone]  

## 2021-08-13 IMAGING — DX DG CHEST 1V PORT
1 series · 1 of 1 positions shown · non-contrast
Comparison: 06/09/2020

CLINICAL DATA: Left side chest pain

EXAM:
PORTABLE CHEST 1 VIEW

[chest ap]
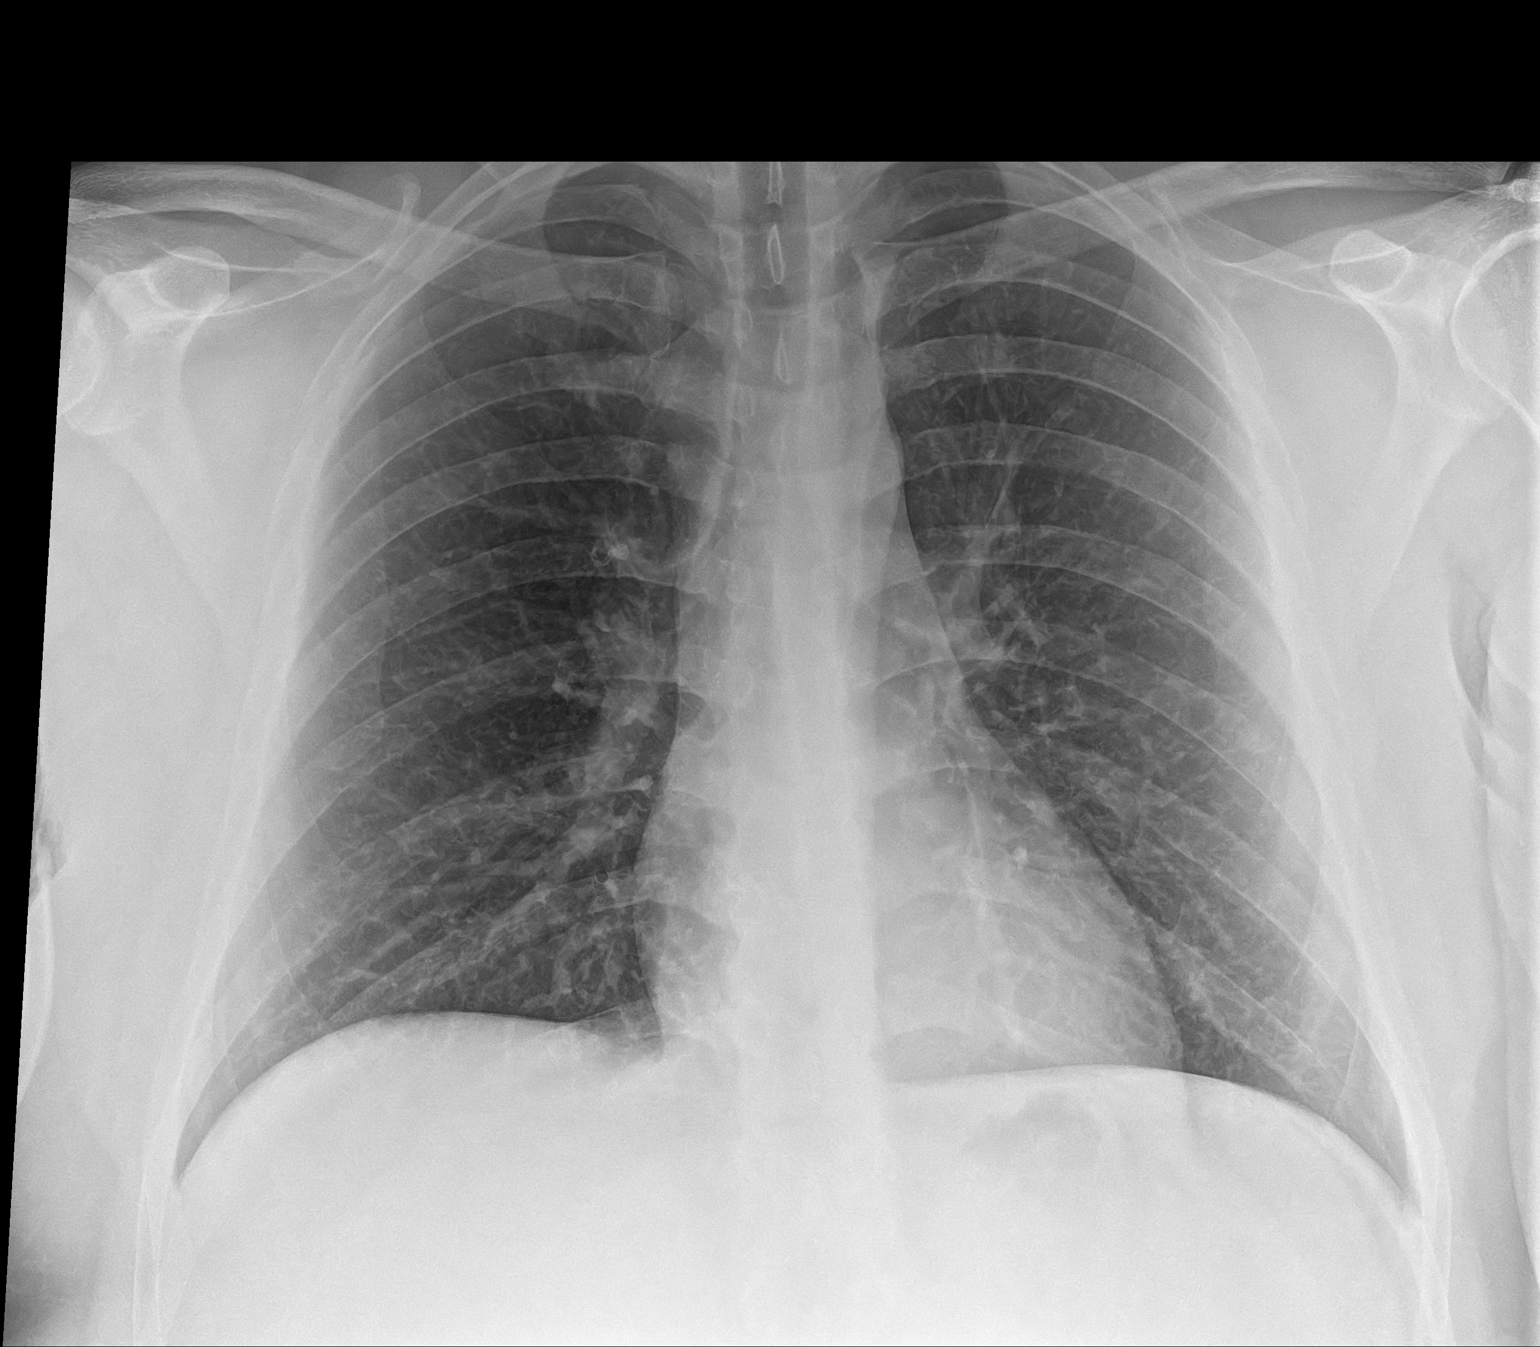

[1 of 1 positions shown; findings below may reference images not displayed]

FINDINGS: The heart size and mediastinal contours are within normal limits.
Both lungs are clear. The visualized skeletal structures are
unremarkable.
IMPRESSION: Normal study.

## 2021-08-21 ENCOUNTER — Ambulatory Visit: Payer: Medicaid Other | Admitting: Orthopedic Surgery

## 2021-09-14 ENCOUNTER — Encounter (HOSPITAL_COMMUNITY): Payer: Self-pay

## 2021-09-14 ENCOUNTER — Emergency Department (HOSPITAL_COMMUNITY)
Admission: EM | Admit: 2021-09-14 | Discharge: 2021-09-14 | Disposition: A | Discharge status: Home / Self Care | Payer: Medicaid Other | Attending: Emergency Medicine | Admitting: Emergency Medicine

## 2021-09-14 ENCOUNTER — Other Ambulatory Visit: Payer: Self-pay

## 2021-09-14 ENCOUNTER — Emergency Department (HOSPITAL_COMMUNITY): Payer: Medicaid Other

## 2021-09-14 DIAGNOSIS — S0990XA Unspecified injury of head, initial encounter: Secondary | ICD-10-CM

## 2021-09-14 DIAGNOSIS — S00211A Abrasion of right eyelid and periocular area, initial encounter: Secondary | ICD-10-CM | POA: Diagnosis not present

## 2021-09-14 DIAGNOSIS — S0501XA Injury of conjunctiva and corneal abrasion without foreign body, right eye, initial encounter: Secondary | ICD-10-CM

## 2021-09-14 DIAGNOSIS — Z20822 Contact with and (suspected) exposure to covid-19: Secondary | ICD-10-CM | POA: Diagnosis not present

## 2021-09-14 DIAGNOSIS — I10 Essential (primary) hypertension: Secondary | ICD-10-CM | POA: Insufficient documentation

## 2021-09-14 DIAGNOSIS — S0003XA Contusion of scalp, initial encounter: Secondary | ICD-10-CM | POA: Diagnosis not present

## 2021-09-14 DIAGNOSIS — R55 Syncope and collapse: Secondary | ICD-10-CM | POA: Insufficient documentation

## 2021-09-14 DIAGNOSIS — Z79899 Other long term (current) drug therapy: Secondary | ICD-10-CM | POA: Insufficient documentation

## 2021-09-14 DIAGNOSIS — F17290 Nicotine dependence, other tobacco product, uncomplicated: Secondary | ICD-10-CM | POA: Insufficient documentation

## 2021-09-14 DIAGNOSIS — W06XXXA Fall from bed, initial encounter: Secondary | ICD-10-CM | POA: Insufficient documentation

## 2021-09-14 LAB — CBC WITH DIFFERENTIAL/PLATELET
Abs Immature Granulocytes: 0.03 10*3/uL (ref 0.00–0.07)
Basophils Absolute: 0 10*3/uL (ref 0.0–0.1)
Basophils Relative: 1 %
Eosinophils Absolute: 0.2 10*3/uL (ref 0.0–0.5)
Eosinophils Relative: 2 %
HCT: 35.7 % — ABNORMAL LOW (ref 39.0–52.0)
Hemoglobin: 11.7 g/dL — ABNORMAL LOW (ref 13.0–17.0)
Immature Granulocytes: 0 %
Lymphocytes Relative: 26 %
Lymphs Abs: 2.2 10*3/uL (ref 0.7–4.0)
MCH: 31.3 pg (ref 26.0–34.0)
MCHC: 32.8 g/dL (ref 30.0–36.0)
MCV: 95.5 fL (ref 80.0–100.0)
Monocytes Absolute: 1 10*3/uL (ref 0.1–1.0)
Monocytes Relative: 12 %
Neutro Abs: 4.8 10*3/uL (ref 1.7–7.7)
Neutrophils Relative %: 59 %
Platelets: 254 10*3/uL (ref 150–400)
RBC: 3.74 MIL/uL — ABNORMAL LOW (ref 4.22–5.81)
RDW: 13.7 % (ref 11.5–15.5)
WBC: 8.3 10*3/uL (ref 4.0–10.5)
nRBC: 0 % (ref 0.0–0.2)

## 2021-09-14 LAB — BASIC METABOLIC PANEL
Anion gap: 9 (ref 5–15)
BUN: 12 mg/dL (ref 6–20)
CO2: 24 mmol/L (ref 22–32)
Calcium: 8.9 mg/dL (ref 8.9–10.3)
Chloride: 104 mmol/L (ref 98–111)
Creatinine, Ser: 0.76 mg/dL (ref 0.61–1.24)
GFR, Estimated: >60 mL/min (ref >60)
Glucose, Bld: 111 mg/dL — ABNORMAL HIGH (ref 70–99)
Potassium: 3.4 mmol/L — ABNORMAL LOW (ref 3.5–5.1)
Sodium: 137 mmol/L (ref 135–145)

## 2021-09-14 LAB — RESP PANEL BY RT-PCR (FLU A&B, COVID) ARPGX2
Influenza A by PCR: NEGATIVE
Influenza B by PCR: NEGATIVE
SARS Coronavirus 2 by RT PCR: NEGATIVE

## 2021-09-14 LAB — TROPONIN I (HIGH SENSITIVITY): Troponin I (High Sensitivity): 2 ng/L (ref <18)

## 2021-09-14 LAB — D-DIMER, QUANTITATIVE: D-Dimer, Quant: <0.27 ug/mL-FEU (ref 0.00–0.50)

## 2021-09-14 MED ORDER — FLUORESCEIN SODIUM 1 MG OP STRP
1.0000 | ORAL_STRIP | Freq: Once | OPHTHALMIC | Status: AC
  Administered 2021-09-14: 1 via OPHTHALMIC

## 2021-09-14 MED ORDER — FENTANYL CITRATE PF 50 MCG/ML IJ SOSY
50.0000 ug | PREFILLED_SYRINGE | Freq: Once | INTRAMUSCULAR | Status: AC
  Administered 2021-09-14: 50 ug via INTRAVENOUS
  Filled 2021-09-14: qty 1

## 2021-09-14 MED ORDER — ACETAMINOPHEN 325 MG PO TABS
650.0000 mg | ORAL_TABLET | Freq: Once | ORAL | Status: AC
  Administered 2021-09-14: 650 mg via ORAL
  Filled 2021-09-14: qty 2

## 2021-09-14 MED ORDER — TETRACAINE HCL 0.5 % OP SOLN
2.0000 [drp] | Freq: Once | OPHTHALMIC | Status: AC
  Administered 2021-09-14: 2 [drp] via OPHTHALMIC

## 2021-09-14 MED ORDER — POLYMYXIN B-TRIMETHOPRIM 10000-0.1 UNIT/ML-% OP SOLN: 1.0000 [drp] | OPHTHALMIC | 0 refills | Status: AC

## 2021-09-14 MED ORDER — KETOROLAC TROMETHAMINE 30 MG/ML IJ SOLN
30.0000 mg | Freq: Once | INTRAMUSCULAR | Status: AC
  Administered 2021-09-14: 30 mg via INTRAVENOUS
  Filled 2021-09-14: qty 1

## 2021-09-14 NOTE — ED Notes (Signed)
C-collar applied

## 2021-09-14 NOTE — ED Notes (Signed)
Report given to [facility], ETA 25 mins

## 2021-09-14 NOTE — ED Provider Notes (Signed)
[facility] EMERGENCY DEPARTMENT Provider Note   CSN: [id number] Arrival date & time: 09/14/21  0045     History Chief Complaint  Patient presents with   Fall    ?    Joseph Blair is a 30 y.o. male.  Patient with a history of hypertension and myopericarditis here with coughing fit and possible syncopal episode.  States ever since he had the flu several weeks ago he has had coughing fits mostly at night.  Today while he was lying in bed he had a coughing fit that woke him up as well as his daughter.  He was sitting up in bed and had another coughing fit and next thing  He knows he was on the ground.  Believes he hit his head on a dresser or the floor.  Wife stated he lost consciousness.  He now feels confused and is having head and neck pain with blurry vision in his right eye.  He states he is never passed out before.  He remembers coughing on the bed and the next thing he knows he was on the ground.  Does not take any blood thinners.  No chest pain or shortness of breath.  No fever.  No abdominal pain, nausea or vomiting.  No focal weakness, numbness or tingling. Reports blurry vision in right eye, pain to right head and right neck.  The history is provided by the patient and a relative.  Fall Associated symptoms include headaches. Pertinent negatives include no chest pain, no abdominal pain and no shortness of breath.      Past Medical History:  Diagnosis Date   ADHD (attention deficit hyperactivity disorder)    GERD (gastroesophageal reflux disease)    Hypertension    Pancreas disorder 2013   patient reports he was told he might have pancreatitis. No elevated lipase or imaging to support per EPIC review    Patient Active Problem List   Diagnosis Date Noted   Elevated LFTs 03/04/2021   Hypokalemia 03/04/2021   GERD (gastroesophageal reflux disease)    Hypertension    S/P ACL repair right 06/02/20 06/07/2020   Old complete tear of anterior cruciate ligament of right  knee    Derangement of posterior horn of medial meniscus of right knee    Abnormal LFTs 02/02/2019   Acute myopericarditis 04/05/2016   Chest pain 04/05/2016   Elevated troponin 04/05/2016   Right ACL tear 11/16/2015   ACL (anterior cruciate ligament) rupture 05/05/2013   ADHD (attention deficit hyperactivity disorder), 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 Garfield Cornea   ANTERIOR CRUCIATE LIGAMENT REPAIR Right 06/02/2020   Procedure: RIGHT ANTERIOR CRUCIATE LIGAMENT (ACL) REPAIR WITH ALLOGRAFT;  Surgeon: Carole Civil, MD;  Location: [facility];  Service: Orthopedics;  Laterality: Right;   HERNIA REPAIR     KNEE ARTHROSCOPY WITH MEDIAL MENISECTOMY Right 06/02/2020   Procedure: RIGHT KNEE ARTHROSCOPY WITH MEDIAL MENISECTOMY;  Surgeon: Carole Civil, MD;  Location: [facility];  Service: Orthopedics;  Laterality: Right;       Family History  Problem Relation Age of Onset   ADD / ADHD Brother    ADD / ADHD Maternal Aunt    Drug abuse Maternal Uncle    Alcohol abuse Maternal Uncle    Schizophrenia Maternal Uncle    Colon cancer Paternal Aunt  58 years old   Anxiety disorder Neg Hx    Bipolar disorder Neg Hx    Dementia Neg Hx    Depression Neg Hx    OCD Neg Hx    Paranoid behavior Neg Hx    Seizures Neg Hx    Sexual abuse Neg Hx    Physical abuse Neg Hx    Liver disease Neg Hx     Social History   Tobacco Use   Smoking status: Some Days    Types: Cigars   Smokeless tobacco: Never  Vaping Use   Vaping Use: Never used  Substance Use Topics   Alcohol use: Yes    Comment: prior to 11/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 Authorizing  Provider  colchicine 0.6 MG tablet Take 1 tablet (0.6 mg total) by mouth 2 (two) times daily. Patient not taking: Reported on 08/07/2021 03/05/21 05/16/21  Heath Lark D, DO  hydrochlorothiazide (HYDRODIURIL) 25 MG tablet Take 25 mg by mouth daily. 02/18/20   [provider]  lisinopril (ZESTRIL) 20 MG tablet Take 20 mg by mouth daily. 01/05/21   [provider]  omeprazole (PRILOSEC) 20 MG capsule Take 20 mg by mouth daily.    [provider]    Allergies    Bee venom  Review of Systems   Review of Systems  Constitutional:  Negative for activity change, appetite change and fever.  HENT:  Negative for congestion.   Eyes:  Positive for visual disturbance.  Respiratory:  Positive for cough. Negative for chest tightness and shortness of breath.   Cardiovascular:  Negative for chest pain.  Gastrointestinal:  Negative for abdominal pain, nausea and vomiting.  Genitourinary:  Negative for dysuria.  Musculoskeletal:  Positive for arthralgias, myalgias and neck pain.  Skin:  Negative for rash.  Neurological:  Positive for dizziness, syncope, light-headedness and headaches. Negative for weakness.   all other systems are negative except as noted in the HPI and PMH.   Physical Exam Updated Vital Signs BP (!) 133/99 (BP Location: Left Arm)   Pulse 80   Temp 97.8 F (36.6 C) (Oral)   Resp 18   Ht 5\' 6"  (1.676 m)   Wt 103 kg   SpO2 100%   BMI 36.64 kg/m   Physical Exam Vitals and nursing note reviewed.  Constitutional:      General: He is not in acute distress.    Appearance: He is well-developed. He is not ill-appearing.  HENT:     Head: Normocephalic.     Comments: Abrasion and hematoma on the right temple and scalp.    Mouth/Throat:     Pharynx: No oropharyngeal exudate.  Eyes:     Conjunctiva/sclera: Conjunctivae normal.     Pupils: Pupils are equal, round, and reactive to light.      Comments: Small areas of fluoroscein uptake No periorbital edema or  ecchymosis  Neck:     Comments: Diffuse paraspinal and midline tenderness.  C-collar placed on arrival R paraspinal cervical tenderness Cardiovascular:     Rate and Rhythm: Normal rate and regular rhythm.     Heart sounds: Normal heart sounds. No murmur heard. Pulmonary:     Effort: Pulmonary effort is normal. No respiratory distress.     Breath sounds: Normal breath  sounds.  Chest:     Chest wall: No tenderness.  Abdominal:     Palpations: Abdomen is soft.     Tenderness: There is no abdominal tenderness. There is no guarding or rebound.  Musculoskeletal:        General: No tenderness. Normal range of motion.     Cervical back: Neck supple.  Skin:    General: Skin is warm.     Comments: Petechiae to face  Neurological:     Mental Status: He is alert and oriented to person, place, and time.     Cranial Nerves: No cranial nerve deficit.     Motor: No abnormal muscle tone.     Coordination: Coordination normal.     Comments:  5/5 strength throughout. CN 2-12 intact.Equal grip strength.   Psychiatric:        Behavior: Behavior normal.    ED Results / Procedures / Treatments   Labs (all labs ordered are listed, but only abnormal results are displayed) Labs Reviewed  CBC WITH DIFFERENTIAL/PLATELET - Abnormal; Notable for the following components:      Result Value   RBC 3.74 (*)    Hemoglobin 11.7 (*)    HCT 35.7 (*)    All other components within normal limits  BASIC METABOLIC PANEL - Abnormal; Notable for the following components:   Potassium 3.4 (*)    Glucose, Bld 111 (*)    All other components within normal limits  RESP PANEL BY RT-PCR (FLU A&B, COVID) [id number]  D-DIMER, QUANTITATIVE  TROPONIN I (HIGH SENSITIVITY)    EKG EKG Interpretation  Date/Time:  Thursday September 14 2021 01:38:11 EST Ventricular Rate:  75 PR Interval:  155 QRS Duration: 94 QT Interval:  395 QTC Calculation: 442 R Axis:   58 Text Interpretation: Sinus rhythm No significant change was  found Confirmed by Ezequiel Essex [phone]) on 09/14/2021 1:46:39 AM  Radiology CT Head Wo Contrast  Result Date: 09/14/2021 CLINICAL DATA:  30 year old male with syncope, fall. Blurred vision. EXAM: CT HEAD WITHOUT CONTRAST TECHNIQUE: Contiguous axial images were obtained from the base of the skull through the vertex without intravenous contrast. COMPARISON:  Face CT reported separately. FINDINGS: Brain: No midline shift, ventriculomegaly, mass effect, evidence of mass lesion, intracranial hemorrhage or evidence of cortically based acute infarction. Gray-white matter differentiation is within normal limits throughout the brain. Normal cerebral volume. Vascular: No suspicious intracranial vascular hyperdensity. Skull: Intact, negative. Sinuses/Orbits: Trace right maxillary and sphenoid sinus mucosal thickening. Other Visualized paranasal sinuses and mastoids are stable and well aerated. Tympanic cavities are clear. Other: No orbit or scalp soft tissue injury identified. IMPRESSION: 1. No acute traumatic injury identified. 2. Normal noncontrast CT appearance of the brain. 3. Minimal sinus disease. Electronically Signed   By: Genevie Ann M.D.   On: 09/14/2021 04:51   CT Cervical Spine Wo Contrast  Result Date: 09/14/2021 CLINICAL DATA:  30 year old male with syncope, fall. Blurred vision. EXAM: CT CERVICAL SPINE WITHOUT CONTRAST TECHNIQUE: Multidetector CT imaging of the cervical spine was performed without intravenous contrast. Multiplanar CT image reconstructions were also generated. COMPARISON:  CT head and face today. FINDINGS: Alignment: Straightening of cervical lordosis. Cervicothoracic junction alignment is within normal limits. Bilateral posterior element alignment is within normal limits. Skull base and vertebrae: Visualized skull base is intact. No atlanto-occipital dissociation. C1 and C2 are intact and aligned. No osseous abnormality identified. Soft tissues and spinal canal: No prevertebral fluid or  swelling. No visible canal hematoma. Negative visible noncontrast neck soft  tissues. Disc levels:  Negative. Upper chest: Negative. IMPRESSION: Negative. No acute traumatic injury identified in the cervical spine. Electronically Signed   By: Odessa Fleming M.D.   On: 09/14/2021 04:58   DG Chest Portable 1 View  Result Date: 09/14/2021 CLINICAL DATA:  Status post fall. EXAM: PORTABLE CHEST 1 VIEW COMPARISON:  Mar 04, 2021 FINDINGS: The heart size and mediastinal contours are within normal limits. Both lungs are clear. The visualized skeletal structures are unremarkable. IMPRESSION: No active disease. Electronically Signed   By: Aram Candela M.D.   On: 09/14/2021 02:13   CT Maxillofacial Wo Contrast  Result Date: 09/14/2021 CLINICAL DATA:  30 year old male with syncope, fall. Blurred vision. EXAM: CT MAXILLOFACIAL WITHOUT CONTRAST TECHNIQUE: Multidetector CT imaging of the maxillofacial structures was performed. Multiplanar CT image reconstructions were also generated. COMPARISON:  CT head and cervical spine today. FINDINGS: Osseous: Mandible intact and normally located. No acute dental finding. Maxilla, zygoma, pterygoid, and nasal bones are intact. Central skull base intact. Cervical spine detailed separately. Orbits: Intact orbital walls. Globes and intraorbital soft tissues appears symmetric and normal. Sinuses: Paranasal sinuses and mastoids are well aerated. There is trace mucosal thickening at the floor of the right maxillary and sphenoid sinuses. Soft tissues: Negative visible noncontrast deep soft tissue spaces of the face. No superficial soft tissue injury identified. Upper cervical lymph nodes are within normal limits. Limited intracranial: Negative. IMPRESSION: No acute traumatic injury identified in the face. Electronically Signed   By: Odessa Fleming M.D.   On: 09/14/2021 04:55    Procedures Procedures   Medications Ordered in ED Medications - No data to display  ED Course  I have reviewed the  triage vital signs and the nursing notes.  Pertinent labs & imaging results that were available during my care of the patient were reviewed by me and considered in my medical decision making (see chart for details).    MDM Rules/Calculators/A&P                         Coughing fit with loss of consciousness and possible syncope.  Vital stable, no distress.  EKG shows sinus rhythm.  No prolonged QT, no Brugada. No focal neurological deficits.  Petechiae likely secondary to coughing fit.  Suspect likely vasovagal syncope.  Blood work shows normal platelet count.  Troponin and D-dimer negative.  Low suspicion for ACS or pulmonary embolism.  No CT scan available at this facility and patient needed to be transferred to [facility] for scans.  CT scan is negative for acute traumatic pathology.  C-collar was removed.  Patient with paraspinal cervical pain in the right but no midline pain.  Orthostatics are positive by heart rate.  He is given IV and p.o. fluids.  He is tolerating p.o. and ambulatory  Low suspicion for cardiogenic cause of syncope.  Patient with recent echocardiogram this year that was reassuring.  Patient tolerating p.o. and ambulatory.  Will encourage oral hydration at home.  Follow-up with PCP.  Return to the ED with chest pain, shortness of breath, nausea, vomiting, recurrent syncope, any other concerns. Final Clinical Impression(s) / ED Diagnoses Final diagnoses:  Syncope and collapse  Minor head injury, initial encounter  Abrasion of right cornea, initial encounter    Rx / DC Orders ED Discharge Orders     None        Amen Staszak, Jeannett Senior, MD 09/14/21 2014

## 2021-09-14 NOTE — ED Triage Notes (Addendum)
Pt states he had the flu three weeks ago and has had a cough since. States he woke up about an hour ago with a coughing fit and ended up on the floor and doesn't remember how. Does not know if he had LOC. Wife states once he got up he was confused and believes he his hit head. Bruising to right temple noted    C/o neck pain and says he cannot turn his head. States the vision in his right eye is blurry.

## 2021-09-14 NOTE — Discharge Instructions (Addendum)
We suspect you passed out from a coughing fit called vasovagal syncope.  Your CT scans are negative for any bleeding or broken bones. Take anti-inflammatories such as ibuprofen for aches and pains and keep yourself hydrated at home.  There is a small scratch in the right cornea which should improve with eyedrops. Follow-up with your doctor.  Return to the ED with difficulty breathing, chest pain, shortness of breath, unilateral weakness, numbness, tingling, any other concerns.

## 2021-10-19 ENCOUNTER — Ambulatory Visit (HOSPITAL_COMMUNITY)
Admission: RE | Admit: 2021-10-19 | Discharge: 2021-10-19 | Disposition: A | Discharge status: Home / Self Care | Payer: Medicaid Other | Source: Ambulatory Visit | Attending: Orthopedic Surgery | Admitting: Orthopedic Surgery

## 2021-10-19 ENCOUNTER — Other Ambulatory Visit: Payer: Self-pay

## 2021-10-19 DIAGNOSIS — Z9889 Other specified postprocedural states: Secondary | ICD-10-CM | POA: Insufficient documentation

## 2021-10-19 DIAGNOSIS — M238X2 Other internal derangements of left knee: Secondary | ICD-10-CM | POA: Insufficient documentation

## 2021-10-23 ENCOUNTER — Telehealth: Payer: Self-pay | Admitting: Orthopedic Surgery

## 2021-10-23 ENCOUNTER — Ambulatory Visit: Payer: Medicaid Other | Admitting: Orthopedic Surgery

## 2021-10-23 NOTE — Telephone Encounter (Signed)
Patient relays he was unable to come to appointment however did not get to call; said his daughter had been sick. Please advise if he is to reschedule or if he may discuss results in a phone visit.

## 2021-10-24 NOTE — Telephone Encounter (Signed)
Called back to patient to schedule accordingly. °

## 2021-10-25 ENCOUNTER — Encounter: Payer: Self-pay | Admitting: Orthopedic Surgery

## 2021-11-09 ENCOUNTER — Ambulatory Visit: Payer: Medicaid Other | Admitting: Orthopedic Surgery

## 2022-06-25 ENCOUNTER — Ambulatory Visit: Admit: 2022-06-25 | Discharge: 2022-06-25 | Discharge status: Absent without leave | Payer: Medicaid Other

## 2022-06-25 ENCOUNTER — Ambulatory Visit
Admission: EM | Admit: 2022-06-25 | Discharge: 2022-06-25 | Disposition: A | Discharge status: Home / Self Care | Payer: Medicaid Other | Attending: Nurse Practitioner | Admitting: Nurse Practitioner

## 2022-06-25 VITALS — BP 125/95 | HR 101 | Temp 99.6°F | Resp 18

## 2022-06-25 DIAGNOSIS — Z1152 Encounter for screening for COVID-19: Secondary | ICD-10-CM | POA: Diagnosis present

## 2022-06-25 DIAGNOSIS — J069 Acute upper respiratory infection, unspecified: Secondary | ICD-10-CM | POA: Insufficient documentation

## 2022-06-25 MED ORDER — PROMETHAZINE-DM 6.25-15 MG/5ML PO SYRP
5.0000 mL | ORAL_SOLUTION | Freq: Every evening | ORAL | 0 refills | Status: DC | PRN
Start: 2022-06-25 — End: 2022-07-28

## 2022-06-25 MED ORDER — BENZONATATE 100 MG PO CAPS
100.0000 mg | ORAL_CAPSULE | Freq: Three times a day (TID) | ORAL | 0 refills | Status: AC | PRN
Start: 2022-06-25 — End: ?

## 2022-06-25 NOTE — ED Provider Notes (Signed)
[facility]    CSN: [id number] Arrival date & time: 06/25/22  1701      History   Chief Complaint Chief Complaint  Patient presents with   Cough    Entered by patient   Fever    HPI Joseph [facility] is a 31 y.o. male.   Patient presents for 1 day of low-grade fever, dry cough, nasal congestion, runny nose, postnasal drainage, sneezing, sinus pressure, eye irritation and clear drainage.  He denies shortness of breath, wheezing, chest pain or tightness, chest congestion, sore throat, headache, face pain, ear pain or pressure, abdominal pain, nausea/vomiting, diarrhea, decreased appetite, rash, and fatigue.  Denies known sick contacts.  Has been taking Tylenol and ibuprofen with minimal relief in symptoms.     Past Medical History:  Diagnosis Date   ADHD (attention deficit hyperactivity disorder)    GERD (gastroesophageal reflux disease)    Hypertension    Pancreas disorder 2013   patient reports he was told he might have pancreatitis. No elevated lipase or imaging to support per EPIC review    Patient Active Problem List   Diagnosis Date Noted   Elevated LFTs 03/04/2021   Hypokalemia 03/04/2021   GERD (gastroesophageal reflux disease)    Hypertension    S/P ACL repair right 06/02/20 06/07/2020   Old complete tear of anterior cruciate ligament of right knee    Derangement of posterior horn of medial meniscus of right knee    Abnormal LFTs 02/02/2019   Acute myopericarditis 04/05/2016   Chest pain 04/05/2016   Elevated troponin 04/05/2016   Right ACL tear 11/16/2015   ACL (anterior cruciate ligament) rupture 05/05/2013   ADHD (attention deficit hyperactivity disorder), 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 Edger House   ANTERIOR CRUCIATE LIGAMENT REPAIR Right 06/02/2020   Procedure: RIGHT ANTERIOR CRUCIATE LIGAMENT  (ACL) REPAIR WITH ALLOGRAFT;  Surgeon: Vickki Hearing, MD;  Location: [facility];  Service: Orthopedics;  Laterality: Right;   HERNIA REPAIR     KNEE ARTHROSCOPY WITH MEDIAL MENISECTOMY Right 06/02/2020   Procedure: RIGHT KNEE ARTHROSCOPY WITH MEDIAL MENISECTOMY;  Surgeon: Vickki Hearing, MD;  Location: [facility];  Service: Orthopedics;  Laterality: Right;       Home Medications    Prior to Admission medications   Medication Sig Start Date End Date Taking? Authorizing Provider  benzonatate (TESSALON) 100 MG capsule Take 1 capsule (100 mg total) by mouth 3 (three) times daily as needed for cough. Do not take with alcohol or while driving or operating heavy machinery 06/25/22  Yes Valentino Nose, NP  promethazine-dextromethorphan (PROMETHAZINE-DM) 6.25-15 MG/5ML syrup Take 5 mLs by mouth at bedtime as needed for cough. Do not take with alcohol or while driving or operating heavy machinery 06/25/22  Yes Cathlean Marseilles A, NP  colchicine 0.6 MG tablet Take 1 tablet (0.6 mg total) by mouth 2 (two) times daily. Patient not taking: Reported on 08/07/2021 03/05/21 05/16/21  Maurilio Lovely D, DO  hydrochlorothiazide (HYDRODIURIL) 25 MG tablet Take 25 mg by mouth daily. 02/18/20   [provider]  lisinopril (ZESTRIL) 20 MG tablet Take 20 mg by mouth daily. 01/05/21   [provider]  omeprazole (PRILOSEC) 20 MG capsule Take 20 mg by mouth daily.    [provider]    Family History Family History  Problem Relation Age of Onset  ADD / ADHD Brother    ADD / ADHD Maternal Aunt    Drug abuse Maternal Uncle    Alcohol abuse Maternal Uncle    Schizophrenia Maternal Uncle    Colon cancer Paternal Aunt        26 years old   Anxiety disorder Neg Hx    Bipolar disorder Neg Hx    Dementia Neg Hx    Depression Neg Hx    OCD Neg Hx    Paranoid behavior Neg Hx    Seizures Neg Hx    Sexual abuse Neg Hx    Physical abuse Neg Hx    Liver disease Neg Hx     Social  History Social History   Tobacco Use   Smoking status: Some Days    Types: Cigars   Smokeless tobacco: Never  Vaping Use   Vaping Use: Never used  Substance Use Topics   Alcohol use: Yes    Comment: prior to 11/2018 was drinking heavy etoh daily, now doesn't drink every day (now pint brown liqour a day)   Drug use: Yes    Types: Marijuana     Allergies   Bee venom   Review of Systems Review of Systems Per HPI  Physical Exam Triage Vital Signs ED Triage Vitals  Enc Vitals Group     BP 06/25/22 1717 (!) 125/95     Pulse Rate 06/25/22 1717 (!) 101     Resp 06/25/22 1717 18     Temp 06/25/22 1717 99.6 F (37.6 C)     Temp src --      SpO2 06/25/22 1717 97 %     Weight --      Height --      Head Circumference --      Peak Flow --      Pain Score 06/25/22 1716 0     Pain Loc --      Pain Edu? --      Excl. in GC? --    No data found.  Updated Vital Signs BP (!) 125/95   Pulse (!) 101   Temp 99.6 F (37.6 C)   Resp 18   SpO2 97%   Visual Acuity Right Eye Distance:   Left Eye Distance:   Bilateral Distance:    Right Eye Near:   Left Eye Near:    Bilateral Near:     Physical Exam Vitals and nursing note reviewed.  Constitutional:      General: He is not in acute distress.    Appearance: Normal appearance. He is not ill-appearing or toxic-appearing.  HENT:     Head: Normocephalic and atraumatic.     Right Ear: Tympanic membrane, ear canal and external ear normal. There is no impacted cerumen.     Left Ear: Tympanic membrane, ear canal and external ear normal. There is no impacted cerumen.     Nose: Congestion and rhinorrhea present.     Right Sinus: Maxillary sinus tenderness and frontal sinus tenderness present.     Left Sinus: Maxillary sinus tenderness and frontal sinus tenderness present.     Mouth/Throat:     Mouth: Mucous membranes are moist.     Pharynx: Oropharynx is clear. Posterior oropharyngeal erythema present. No oropharyngeal exudate.   Cardiovascular:     Rate and Rhythm: Normal rate and regular rhythm.  Pulmonary:     Effort: Pulmonary effort is normal. No respiratory distress.     Breath sounds: Normal breath sounds. No wheezing,  rhonchi or rales.  Abdominal:     General: Abdomen is flat. Bowel sounds are normal. There is no distension.     Palpations: Abdomen is soft.  Musculoskeletal:     Cervical back: Normal range of motion and neck supple.  Lymphadenopathy:     Cervical: No cervical adenopathy.  Skin:    General: Skin is warm and dry.     Coloration: Skin is not jaundiced or pale.     Findings: No erythema or rash.  Neurological:     Mental Status: He is alert and oriented to person, place, and time.  Psychiatric:        Behavior: Behavior is cooperative.      UC Treatments / Results  Labs (all labs ordered are listed, but only abnormal results are displayed) Labs Reviewed  SARS CORONAVIRUS 2 (TAT 6-24 HRS)    EKG   Radiology No results found.  Procedures Procedures (including critical care time)  Medications Ordered in UC Medications - No data to display  Initial Impression / Assessment and Plan / UC Course  I have reviewed the triage vital signs and the nursing notes.  Pertinent labs & imaging results that were available during my care of the patient were reviewed by me and considered in my medical decision making (see chart for details).    Patient is appearing, normotensive, low-grade fever, slightly tachycardic, not tachypneic, oxygenating well on room air.  COVID-19 testing obtained.  Reassured patient that symptoms and exam findings are most consistent with a viral upper respiratory infection and explained lack of efficacy of antibiotics against viruses.  Supportive care discussed.  Start cough suppressants as well.  Note given for work.  Return and ER precautions discussed.  The patient was given the opportunity to ask questions.  All questions answered to their satisfaction.  The  patient is in agreement to this plan.   Final Clinical Impressions(s) / UC Diagnoses   Final diagnoses:  Viral URI with cough  Encounter for screening for COVID-19     Discharge Instructions      Your symptoms and exam findings are most consistent with a viral upper respiratory infection. These usually run their course in about 10 days.  If your symptoms last longer than 10 days without improvement, please follow up with your primary care provider.  If your symptoms, worsen, please go to the Emergency Room.    We have tested you today for COVID-19.  You will see the results in Mychart and we will call you with positive results.    Please stay home and isolate until you are aware of the results.    Some things that can make you feel better are: - Increased rest - Increasing fluid with water/sugar free electrolytes - Acetaminophen and ibuprofen as needed for fever/pain.  - Salt water gargling, chloraseptic spray and throat lozenges - OTC guaifenesin (Mucinex) 600 mg twice daily.  - Saline sinus flushes or a neti pot.  - Humidifying the air. - Cough perles during the day for dry cough and cough syrup at night time as needed for dry cough.     ED Prescriptions     Medication Sig Dispense Auth. Provider   promethazine-dextromethorphan (PROMETHAZINE-DM) 6.25-15 MG/5ML syrup Take 5 mLs by mouth at bedtime as needed for cough. Do not take with alcohol or while driving or operating heavy machinery 118 mL Cathlean Marseilles A, NP   benzonatate (TESSALON) 100 MG capsule Take 1 capsule (100 mg total) by mouth 3 (  three) times daily as needed for cough. Do not take with alcohol or while driving or operating heavy machinery 21 capsule Valentino Nose, NP      PDMP not reviewed this encounter.   Valentino Nose, NP 06/25/22 1857

## 2022-06-25 NOTE — ED Triage Notes (Signed)
Pt presents with c/o cough, runny nose and fever yesterday

## 2022-06-25 NOTE — Discharge Instructions (Signed)
Your symptoms and exam findings are most consistent with a viral upper respiratory infection. These usually run their course in about 10 days.  If your symptoms last longer than 10 days without improvement, please follow up with your primary care provider.  If your symptoms, worsen, please go to the Emergency Room.    We have tested you today for COVID-19.  You will see the results in Mychart and we will call you with positive results.    Please stay home and isolate until you are aware of the results.    Some things that can make you feel better are: - Increased rest - Increasing fluid with water/sugar free electrolytes - Acetaminophen and ibuprofen as needed for fever/pain  - Salt water gargling, chloraseptic spray and throat lozenges - OTC guaifenesin (Mucinex) 600 mg twice daily  - Saline sinus flushes or a neti pot - Humidifying the air - Cough perles during the day for dry cough and cough syrup at night time as needed for dry cough 

## 2022-06-26 LAB — SARS CORONAVIRUS 2 (TAT 6-24 HRS): SARS Coronavirus 2: POSITIVE — AB

## 2022-07-28 ENCOUNTER — Emergency Department (HOSPITAL_COMMUNITY)
Admission: EM | Admit: 2022-07-28 | Discharge: 2022-07-28 | Disposition: A | Discharge status: Home / Self Care | Payer: Medicaid Other | Attending: Emergency Medicine | Admitting: Emergency Medicine

## 2022-07-28 ENCOUNTER — Encounter (HOSPITAL_COMMUNITY): Payer: Self-pay | Admitting: *Deleted

## 2022-07-28 ENCOUNTER — Other Ambulatory Visit: Payer: Self-pay

## 2022-07-28 ENCOUNTER — Emergency Department (HOSPITAL_COMMUNITY): Payer: Medicaid Other

## 2022-07-28 DIAGNOSIS — Z79899 Other long term (current) drug therapy: Secondary | ICD-10-CM | POA: Diagnosis not present

## 2022-07-28 DIAGNOSIS — D649 Anemia, unspecified: Secondary | ICD-10-CM | POA: Insufficient documentation

## 2022-07-28 DIAGNOSIS — R1030 Lower abdominal pain, unspecified: Secondary | ICD-10-CM

## 2022-07-28 DIAGNOSIS — K625 Hemorrhage of anus and rectum: Secondary | ICD-10-CM | POA: Insufficient documentation

## 2022-07-28 DIAGNOSIS — I10 Essential (primary) hypertension: Secondary | ICD-10-CM | POA: Insufficient documentation

## 2022-07-28 HISTORY — DX: Acute pancreatitis without necrosis or infection, unspecified: K85.90

## 2022-07-28 LAB — URINALYSIS, ROUTINE W REFLEX MICROSCOPIC
Bilirubin Urine: NEGATIVE
Glucose, UA: NEGATIVE mg/dL
Hgb urine dipstick: NEGATIVE
Ketones, ur: NEGATIVE mg/dL
Leukocytes,Ua: NEGATIVE
Nitrite: NEGATIVE
Protein, ur: NEGATIVE mg/dL
Specific Gravity, Urine: 1.012 (ref 1.005–1.030)
pH: 7 (ref 5.0–8.0)

## 2022-07-28 LAB — COMPREHENSIVE METABOLIC PANEL
ALT: 42 U/L (ref 0–44)
AST: 27 U/L (ref 15–41)
Albumin: 4 g/dL (ref 3.5–5.0)
Alkaline Phosphatase: 90 U/L (ref 38–126)
Anion gap: 8 (ref 5–15)
BUN: 11 mg/dL (ref 6–20)
CO2: 24 mmol/L (ref 22–32)
Calcium: 9.2 mg/dL (ref 8.9–10.3)
Chloride: 104 mmol/L (ref 98–111)
Creatinine, Ser: 0.9 mg/dL (ref 0.61–1.24)
GFR, Estimated: >60 mL/min (ref >60)
Glucose, Bld: 94 mg/dL (ref 70–99)
Potassium: 3.7 mmol/L (ref 3.5–5.1)
Sodium: 136 mmol/L (ref 135–145)
Total Bilirubin: 0.5 mg/dL (ref 0.3–1.2)
Total Protein: 7.9 g/dL (ref 6.5–8.1)

## 2022-07-28 LAB — CBC WITH DIFFERENTIAL/PLATELET
Abs Immature Granulocytes: 0.04 10*3/uL (ref 0.00–0.07)
Basophils Absolute: 0.1 10*3/uL (ref 0.0–0.1)
Basophils Relative: 1 %
Eosinophils Absolute: 0.1 10*3/uL (ref 0.0–0.5)
Eosinophils Relative: 1 %
HCT: 33.6 % — ABNORMAL LOW (ref 39.0–52.0)
Hemoglobin: 10.6 g/dL — ABNORMAL LOW (ref 13.0–17.0)
Immature Granulocytes: 0 %
Lymphocytes Relative: 25 %
Lymphs Abs: 2.7 10*3/uL (ref 0.7–4.0)
MCH: 24.4 pg — ABNORMAL LOW (ref 26.0–34.0)
MCHC: 31.5 g/dL (ref 30.0–36.0)
MCV: 77.2 fL — ABNORMAL LOW (ref 80.0–100.0)
Monocytes Absolute: 1.5 10*3/uL — ABNORMAL HIGH (ref 0.1–1.0)
Monocytes Relative: 14 %
Neutro Abs: 6.3 10*3/uL (ref 1.7–7.7)
Neutrophils Relative %: 59 %
Platelets: 257 10*3/uL (ref 150–400)
RBC: 4.35 MIL/uL (ref 4.22–5.81)
RDW: 17 % — ABNORMAL HIGH (ref 11.5–15.5)
WBC: 10.8 10*3/uL — ABNORMAL HIGH (ref 4.0–10.5)
nRBC: 0 % (ref 0.0–0.2)

## 2022-07-28 LAB — LIPASE, BLOOD: Lipase: 30 U/L (ref 11–51)

## 2022-07-28 MED ORDER — MORPHINE SULFATE (PF) 4 MG/ML IV SOLN
4.0000 mg | Freq: Once | INTRAVENOUS | Status: AC
  Administered 2022-07-28: 4 mg via INTRAVENOUS
  Filled 2022-07-28: qty 1

## 2022-07-28 MED ORDER — SODIUM CHLORIDE 0.9 % IV BOLUS
500.0000 mL | Freq: Once | INTRAVENOUS | Status: AC
  Administered 2022-07-28: 500 mL via INTRAVENOUS

## 2022-07-28 MED ORDER — PANTOPRAZOLE SODIUM 40 MG PO TBEC: 40.0000 mg | DELAYED_RELEASE_TABLET | Freq: Every day | ORAL | 0 refills | Status: AC

## 2022-07-28 MED ORDER — POLYETHYLENE GLYCOL 3350 17 GM/SCOOP PO POWD: 17.0000 g | Freq: Every day | ORAL | 0 refills | Status: AC

## 2022-07-28 MED ORDER — IOHEXOL 300 MG/ML  SOLN
100.0000 mL | Freq: Once | INTRAMUSCULAR | Status: AC | PRN
  Administered 2022-07-28: 100 mL via INTRAVENOUS

## 2022-07-28 MED ORDER — ONDANSETRON HCL 4 MG/2ML IJ SOLN
4.0000 mg | Freq: Once | INTRAMUSCULAR | Status: AC
  Administered 2022-07-28: 4 mg via INTRAVENOUS
  Filled 2022-07-28: qty 2

## 2022-07-28 MED ORDER — KETOROLAC TROMETHAMINE 30 MG/ML IJ SOLN
30.0000 mg | Freq: Once | INTRAMUSCULAR | Status: AC
  Administered 2022-07-28: 30 mg via INTRAVENOUS
  Filled 2022-07-28: qty 1

## 2022-07-28 NOTE — ED Notes (Signed)
Pt is unable to void urine at this time.

## 2022-07-28 NOTE — ED Provider Notes (Signed)
[facility] Provider Note   CSN: [id number] Arrival date & time: 07/28/22  1126     History  Chief Complaint  Patient presents with   Abdominal Pain    [facility] is a 31 y.o. male history including GERD, hypertension who was also started on atorvastatin 6 days ago for hypercholesterolemia presenting with a 3-day history of suprapubic abdominal pain along with reduced urination.  He describes constant pain in his suprapubic region although denies sensation of an overflow bladder.  He typically has to urinate very frequently as he is on hydrochlorothiazide, but has noted since his pain began he is only urinated small amounts of urine and less frequently than normal.  His pain radiates into his midline lower back.  It is constant and not worsened with movement or meals.  Although he reports reduced appetite he did eat a full meal yesterday evening which did not seem to adversely affect his pain, but he also took a dose of Tylenol with the meal as well.  Denies fevers or chills, denies flank pain or history of kidney stones.  No history of UTIs.  Denies penile discharge.  No risk for STDs.  No known kidney problems.  The history is provided by the patient and the spouse.       Home Medications Prior to Admission medications   Medication Sig Start Date End Date Taking? Authorizing Provider  atorvastatin (LIPITOR) 20 MG tablet Take 20 mg by mouth daily. 07/12/22  Yes [provider]  hydrochlorothiazide (HYDRODIURIL) 25 MG tablet Take 25 mg by mouth daily. 02/18/20  Yes [provider]  lisinopril (ZESTRIL) 20 MG tablet Take 20 mg by mouth daily. 01/05/21  Yes [provider]  pantoprazole (PROTONIX) 40 MG tablet Take 1 tablet (40 mg total) by mouth daily. 07/28/22  Yes Patches Mcdonnell, Almyra Free, PA-C  polyethylene glycol powder (GLYCOLAX/MIRALAX) 17 GM/SCOOP powder Take 17 g by mouth daily. 07/28/22  Yes Woodward Klem, Almyra Free, PA-C  benzonatate (TESSALON) 100 MG  capsule Take 1 capsule (100 mg total) by mouth 3 (three) times daily as needed for cough. Do not take with alcohol or while driving or operating heavy machinery Patient not taking: Reported on 07/28/2022 06/25/22   Eulogio Bear, NP      Allergies    Bee venom    Review of Systems   Review of Systems  Constitutional:  Negative for chills and fever.  HENT: Negative.  Negative for congestion.   Eyes: Negative.   Respiratory:  Negative for chest tightness and shortness of breath.   Cardiovascular:  Negative for chest pain.  Gastrointestinal:  Positive for abdominal pain, blood in stool and nausea. Negative for constipation and vomiting.       Reports chronic blood after stooling for months.  Genitourinary:  Positive for difficulty urinating. Negative for penile discharge.  Musculoskeletal:  Positive for back pain. Negative for arthralgias, joint swelling and neck pain.  Skin: Negative.  Negative for rash and wound.  Neurological:  Negative for dizziness, weakness, light-headedness, numbness and headaches.  Psychiatric/Behavioral: Negative.    All other systems reviewed and are negative.   Physical Exam Updated Vital Signs BP 106/85   Pulse 82   Temp 98.1 F (36.7 C) (Oral)   Resp 16   Ht 5\' 6"  (1.676 m)   Wt 102.1 kg   SpO2 98%   BMI 36.32 kg/m  Physical Exam Vitals and nursing note reviewed.  Constitutional:      Appearance: He is  well-developed.  HENT:     Head: Normocephalic and atraumatic.  Eyes:     Conjunctiva/sclera: Conjunctivae normal.  Cardiovascular:     Rate and Rhythm: Normal rate and regular rhythm.     Heart sounds: Normal heart sounds.  Pulmonary:     Effort: Pulmonary effort is normal.     Breath sounds: Normal breath sounds. No wheezing.  Abdominal:     General: Bowel sounds are normal. There is no distension.     Palpations: Abdomen is soft. There is no mass.     Tenderness: There is abdominal tenderness in the suprapubic area. There is no  right CVA tenderness, left CVA tenderness, guarding or rebound.  Musculoskeletal:        General: Normal range of motion.     Cervical back: Normal range of motion.  Skin:    General: Skin is warm and dry.  Neurological:     Mental Status: He is alert.     ED Results / Procedures / Treatments   Labs (all labs ordered are listed, but only abnormal results are displayed) Labs Reviewed  CBC WITH DIFFERENTIAL/PLATELET - Abnormal; Notable for the following components:      Result Value   WBC 10.8 (*)    Hemoglobin 10.6 (*)    HCT 33.6 (*)    MCV 77.2 (*)    MCH 24.4 (*)    RDW 17.0 (*)    Monocytes Absolute 1.5 (*)    All other components within normal limits  COMPREHENSIVE METABOLIC PANEL  LIPASE, BLOOD  URINALYSIS, ROUTINE W REFLEX MICROSCOPIC  GC/CHLAMYDIA PROBE AMP ([place]) NOT AT [facility]    EKG None  Radiology CT ABDOMEN PELVIS W CONTRAST  Result Date: 07/28/2022 CLINICAL DATA:  Right lower quadrant abdominal pain. EXAM: CT ABDOMEN AND PELVIS WITH CONTRAST TECHNIQUE: Multidetector CT imaging of the abdomen and pelvis was performed using the standard protocol following bolus administration of intravenous contrast. RADIATION DOSE REDUCTION: This exam was performed according to the departmental dose-optimization program which includes automated exposure control, adjustment of the mA and/or kV according to patient size and/or use of iterative reconstruction technique. CONTRAST:  OMNIPAQUE IOHEXOL 300 MG/ML  SOLN COMPARISON:  CT examination dated May 09, 2012 FINDINGS: Lower chest: Bibasilar dependent atelectasis. Hepatobiliary: No focal liver abnormality is seen. No gallstones, gallbladder wall thickening, or biliary dilatation. Pancreas: Unremarkable. No pancreatic ductal dilatation or surrounding inflammatory changes. Spleen: Normal in size without focal abnormality. Adrenals/Urinary Tract: Adrenal glands are unremarkable. Kidneys are normal, without renal calculi, focal  lesion, or hydronephrosis. Bladder is unremarkable. Stomach/Bowel: Stomach is within normal limits. Appendix appears normal. No evidence of bowel wall thickening, distention, or inflammatory changes. Vascular/Lymphatic: No significant vascular findings are present. No enlarged abdominal or pelvic lymph nodes. Reproductive: Prostate is unremarkable. Other: No abdominal wall hernia or abnormality. No abdominopelvic ascites. Musculoskeletal: No acute or significant osseous findings. IMPRESSION: 1. No CT evidence of acute abdominal/pelvic process. 2. Normal appendix. No evidence of colitis or diverticulitis. 3. No evidence of nephrolithiasis or hydronephrosis. Electronically Signed   By: Larose Hires D.O.   On: 07/28/2022 15:02    Procedures Procedures    Medications Ordered in ED Medications  morphine (PF) 4 MG/ML injection 4 mg (4 mg Intravenous Given 07/28/22 1250)  ondansetron (ZOFRAN) injection 4 mg (4 mg Intravenous Given 07/28/22 1243)  sodium chloride 0.9 % bolus 500 mL (0 mLs Intravenous Stopped 07/28/22 1340)  iohexol (OMNIPAQUE) 300 MG/ML solution 100 mL (100 mLs Intravenous Contrast  Given 07/28/22 1442)    ED Course/ Medical Decision Making/ A&P Clinical Course as of 07/28/22 1540  Sat Jul 28, 2022  1356 This is a 31 year old male presenting to the ED with complaint of lower abdominal pain, nausea, onset approximately 2 days ago.  He reports he is also felt constipated and difficulty with bowel movement the past 2 to 3 days.  He has never had this symptom before.  The pain in his suprapubic and into his lower back.  He has not been able to urinate today.  Denies history of kidney stones.  Denies history of abdominal surgery.  Vital signs are unremarkable, afebrile.  He does have a very minor leukocytosis white blood cell count 12.8.  He also has a chronic microcytic anemia with a hemoglobin of 10.6, which was low several months ago as well, however a year or 2 ago his hemoglobin was normal.   We discussed this anemia at the bedside.  I suspect likely related to a GI bleed, she does report he has bright red blood frequently, on and off for a week or 2 at a time, with his stool.  This may be a diverticular bleed but it may also be inflammatory bowel disease.  Conversely, this may be an upper GI bleed slowly, as the patient does suffer from reflux, is on antacid medications, and needs spicy food "all day long".  We discussed some dietary recommendations and will refer to GI after his work-up in the ED.  I think is reasonable to pursue a CT scan of the abdomen here in the ED to evaluate for lower abdominal GI cause of his dysuria and other symptoms including constipation.  We can evaluate for cystitis, pyelonephritis, or diverticulitis.  He was still try to produce a urine sample here in the ED.  Otherwise he is clinically well-appearing and stable, I have a low suspicion for sepsis [MT]    Clinical Course User Index [MT] Terald Sleeper, MD                           Medical Decision Making Patient presenting with suprapubic pain, several day history of perceived reduced urinary frequency and urine production without dysuria.  No flank pain but he does endorse midline low back pain.  Chronic bright red blood with bowel movements with increased constipation for the past several days.  No fevers.  No emergent findings on today's work-up.  His WBC count is slightly elevated however at 10.8.  He also has an anemia with a hemoglobin of 10.6, this is a microcytic pattern suggesting chronic blood loss consistent with his description of blood with bowel movements.  He does not chronically have problems with abdominal pain or changes in bowel pattern, no family history of IBD.  He does endorse reflux for which he takes omeprazole, endorses spicy food intake daily.  Dietary changes were discussed, we will increase his reflux dosing to 40 mg Protonix daily, which should also help cover in event he had  significant gastritis or PUD, felt less likely secondary to normal BUN on his lab work and he denies melena, describes bright red blood which would be more consistent with a rectal outlet bleeding, possibly internal hemorrhoid.  Also endorses constipation, MiraLAX as prescribed.  We will plan GI follow-up, referral was given.  Amount and/or Complexity of Data Reviewed Labs: ordered.    Details: Per above Radiology: ordered.    Details: CT imaging is  negative for acute intra-abdominal process.  Appendix is visualized and normal, no diverticulitis or colitis is present.  Also no hydronephrosis or nephrolithiasis is present.  Risk Prescription drug management. Decision regarding hospitalization. Risk Details: Indication for hospitalization at this time.           Final Clinical Impression(s) / ED Diagnoses Final diagnoses:  Bright red blood per rectum  Chronic anemia  Lower abdominal pain    Rx / DC Orders ED Discharge Orders          Ordered    pantoprazole (PROTONIX) 40 MG tablet  Daily        07/28/22 1536    polyethylene glycol powder (GLYCOLAX/MIRALAX) 17 GM/SCOOP powder  Daily        07/28/22 1540              Burgess Amor, PA-C 07/28/22 1545    Terald Sleeper, MD 07/29/22 [phone]

## 2022-07-28 NOTE — Discharge Instructions (Addendum)
Your lab tests and CT imaging today are reassuring that there does not appear to be any emergent condition at this time.  However you do have a mild anemia which means a low blood count, possibly secondary to the chronic blood that you see with bowel movements.  You will need further testing as an outpatient with a gastroenterologist.  Call Dr. Abbey Chatters for this follow-up.  In the interim you are being prescribed a stronger dose of an acid reflux medication to take in place of your omeprazole.    Return here for any new or worsened symptoms in the interim.  Avoid eating  spicy foods as discussed here.  We also suggest you taking MiraLAX daily to help you with your constipation which has also been prescribed.

## 2022-07-28 NOTE — ED Triage Notes (Signed)
Pt with lower abd pain x  2 days ago, lower back pain since yesterday.  Denies any blood in urine or burning with urination.  Pt states decrease in urine, also with constipation LBm today and normal per pt.

## 2022-07-30 LAB — GC/CHLAMYDIA PROBE AMP (~~LOC~~) NOT AT ARMC
Chlamydia: NEGATIVE
Comment: NEGATIVE
Comment: NORMAL
Neisseria Gonorrhea: NEGATIVE

## 2022-09-18 ENCOUNTER — Emergency Department (HOSPITAL_COMMUNITY)
Admission: EM | Admit: 2022-09-18 | Discharge: 2022-09-19 | Disposition: A | Discharge status: Home / Self Care | Payer: Medicaid Other | Attending: Emergency Medicine | Admitting: Emergency Medicine

## 2022-09-18 ENCOUNTER — Encounter (HOSPITAL_COMMUNITY): Payer: Self-pay | Admitting: Emergency Medicine

## 2022-09-18 ENCOUNTER — Other Ambulatory Visit: Payer: Self-pay

## 2022-09-18 DIAGNOSIS — I10 Essential (primary) hypertension: Secondary | ICD-10-CM | POA: Insufficient documentation

## 2022-09-18 DIAGNOSIS — Y906 Blood alcohol level of 120-199 mg/100 ml: Secondary | ICD-10-CM | POA: Diagnosis not present

## 2022-09-18 DIAGNOSIS — Z79899 Other long term (current) drug therapy: Secondary | ICD-10-CM | POA: Diagnosis not present

## 2022-09-18 DIAGNOSIS — R456 Violent behavior: Secondary | ICD-10-CM | POA: Diagnosis present

## 2022-09-18 DIAGNOSIS — F29 Unspecified psychosis not due to a substance or known physiological condition: Secondary | ICD-10-CM | POA: Insufficient documentation

## 2022-09-18 DIAGNOSIS — F309 Manic episode, unspecified: Secondary | ICD-10-CM | POA: Diagnosis not present

## 2022-09-18 DIAGNOSIS — F909 Attention-deficit hyperactivity disorder, unspecified type: Secondary | ICD-10-CM | POA: Diagnosis not present

## 2022-09-18 DIAGNOSIS — F6381 Intermittent explosive disorder: Secondary | ICD-10-CM | POA: Diagnosis not present

## 2022-09-18 DIAGNOSIS — R45851 Suicidal ideations: Secondary | ICD-10-CM | POA: Insufficient documentation

## 2022-09-18 LAB — COMPREHENSIVE METABOLIC PANEL
ALT: 44 U/L (ref 0–44)
AST: 35 U/L (ref 15–41)
Albumin: 4.1 g/dL (ref 3.5–5.0)
Alkaline Phosphatase: 92 U/L (ref 38–126)
Anion gap: 12 (ref 5–15)
BUN: 10 mg/dL (ref 6–20)
CO2: 21 mmol/L — ABNORMAL LOW (ref 22–32)
Calcium: 8.7 mg/dL — ABNORMAL LOW (ref 8.9–10.3)
Chloride: 108 mmol/L (ref 98–111)
Creatinine, Ser: 0.77 mg/dL (ref 0.61–1.24)
GFR, Estimated: >60 mL/min (ref >60)
Glucose, Bld: 98 mg/dL (ref 70–99)
Potassium: 3.8 mmol/L (ref 3.5–5.1)
Sodium: 141 mmol/L (ref 135–145)
Total Bilirubin: 0.3 mg/dL (ref 0.3–1.2)
Total Protein: 7.9 g/dL (ref 6.5–8.1)

## 2022-09-18 LAB — CBC WITH DIFFERENTIAL/PLATELET
Abs Immature Granulocytes: 0.03 10*3/uL (ref 0.00–0.07)
Basophils Absolute: 0 10*3/uL (ref 0.0–0.1)
Basophils Relative: 0 %
Eosinophils Absolute: 0.1 10*3/uL (ref 0.0–0.5)
Eosinophils Relative: 1 %
HCT: 34.4 % — ABNORMAL LOW (ref 39.0–52.0)
Hemoglobin: 10.5 g/dL — ABNORMAL LOW (ref 13.0–17.0)
Immature Granulocytes: 0 %
Lymphocytes Relative: 43 %
Lymphs Abs: 4 10*3/uL (ref 0.7–4.0)
MCH: 23.6 pg — ABNORMAL LOW (ref 26.0–34.0)
MCHC: 30.5 g/dL (ref 30.0–36.0)
MCV: 77.5 fL — ABNORMAL LOW (ref 80.0–100.0)
Monocytes Absolute: 0.8 10*3/uL (ref 0.1–1.0)
Monocytes Relative: 9 %
Neutro Abs: 4.4 10*3/uL (ref 1.7–7.7)
Neutrophils Relative %: 47 %
Platelets: 297 10*3/uL (ref 150–400)
RBC: 4.44 MIL/uL (ref 4.22–5.81)
RDW: 17.7 % — ABNORMAL HIGH (ref 11.5–15.5)
WBC: 9.3 10*3/uL (ref 4.0–10.5)
nRBC: 0 % (ref 0.0–0.2)

## 2022-09-18 LAB — RAPID URINE DRUG SCREEN, HOSP PERFORMED
Amphetamines: NOT DETECTED
Barbiturates: NOT DETECTED
Benzodiazepines: NOT DETECTED
Cocaine: NOT DETECTED
Opiates: NOT DETECTED
Tetrahydrocannabinol: NOT DETECTED

## 2022-09-18 LAB — ETHANOL: Alcohol, Ethyl (B): 150 mg/dL — ABNORMAL HIGH (ref <10)

## 2022-09-18 MED ORDER — ZIPRASIDONE MESYLATE 20 MG IM SOLR: 20.0000 mg | INTRAMUSCULAR | Status: DC | PRN

## 2022-09-18 MED ORDER — HYDROCHLOROTHIAZIDE 25 MG PO TABS
25.0000 mg | ORAL_TABLET | Freq: Every day | ORAL | Status: DC
  Administered 2022-09-19: 25 mg via ORAL
  Filled 2022-09-18: qty 1

## 2022-09-18 MED ORDER — LORAZEPAM 1 MG PO TABS: 1.0000 mg | ORAL_TABLET | ORAL | Status: DC | PRN

## 2022-09-18 MED ORDER — ACETAMINOPHEN 325 MG PO TABS: 650.0000 mg | ORAL_TABLET | ORAL | Status: DC | PRN

## 2022-09-18 MED ORDER — RISPERIDONE 1 MG PO TBDP: 2.0000 mg | ORAL_TABLET | Freq: Three times a day (TID) | ORAL | Status: DC | PRN

## 2022-09-18 MED ORDER — NICOTINE 21 MG/24HR TD PT24
21.0000 mg | MEDICATED_PATCH | Freq: Every day | TRANSDERMAL | Status: DC
  Filled 2022-09-18: qty 1

## 2022-09-18 MED ORDER — ALUM & MAG HYDROXIDE-SIMETH 200-200-20 MG/5ML PO SUSP: 30.0000 mL | Freq: Four times a day (QID) | ORAL | Status: DC | PRN

## 2022-09-18 MED ORDER — LISINOPRIL 10 MG PO TABS
20.0000 mg | ORAL_TABLET | Freq: Every day | ORAL | Status: DC
  Administered 2022-09-19: 20 mg via ORAL
  Filled 2022-09-18: qty 2

## 2022-09-18 MED ORDER — PANTOPRAZOLE SODIUM 40 MG PO TBEC
40.0000 mg | DELAYED_RELEASE_TABLET | Freq: Every day | ORAL | Status: DC
  Administered 2022-09-19: 40 mg via ORAL
  Filled 2022-09-18: qty 1

## 2022-09-18 MED ORDER — ONDANSETRON HCL 4 MG PO TABS: 4.0000 mg | ORAL_TABLET | Freq: Three times a day (TID) | ORAL | Status: DC | PRN

## 2022-09-18 MED ORDER — ZOLPIDEM TARTRATE 5 MG PO TABS: 5.0000 mg | ORAL_TABLET | Freq: Every evening | ORAL | Status: DC | PRN

## 2022-09-18 NOTE — ED Notes (Signed)
Pt dressed out in burgundy scrubs and wanded by security

## 2022-09-18 NOTE — ED Provider Notes (Signed)
[facility] EMERGENCY DEPARTMENT Provider Note   CSN: [id number] Arrival date & time: 09/18/22  2139     History  Chief Complaint  Patient presents with   V70.1    Joseph Blair is a 31 y.o. male.  The history is provided by the patient and medical records. No language interpreter was used.     31 year old male with significant history of intermittent explosive disorder, ADHD, hypertension, brought here accompanied by [employer] for evaluation of suicidal ideation.  Patient reported he had a verbal argument with his wife over the phone and threatened to shoot himself.  His wife called police and who brought him here.  Patient admits forcing SI but does not have any intent to harm himself.  States he was anxious out of frustration.  He has never harmed himself in the past and he denies any homicidal ideation.  Patient denies auditory or visual hallucination.  He denies any alcohol or drug use.  Denies any recent medication changes.  He is here voluntarily.  He denies any change in his sleeping or eating habit.  Home Medications Prior to Admission medications   Medication Sig Start Date End Date Taking? Authorizing Provider  atorvastatin (LIPITOR) 20 MG tablet Take 20 mg by mouth daily. 07/12/22   [provider]  benzonatate (TESSALON) 100 MG capsule Take 1 capsule (100 mg total) by mouth 3 (three) times daily as needed for cough. Do not take with alcohol or while driving or operating heavy machinery Patient not taking: Reported on 07/28/2022 06/25/22   Valentino Nose, NP  hydrochlorothiazide (HYDRODIURIL) 25 MG tablet Take 25 mg by mouth daily. 02/18/20   [provider]  lisinopril (ZESTRIL) 20 MG tablet Take 20 mg by mouth daily. 01/05/21   [provider]  pantoprazole (PROTONIX) 40 MG tablet Take 1 tablet (40 mg total) by mouth daily. 07/28/22   Idol, Raynelle Fanning, PA-C  polyethylene glycol powder (GLYCOLAX/MIRALAX) 17 GM/SCOOP powder Take 17 g by mouth daily.  07/28/22   Burgess Amor, PA-C      Allergies    Bee venom    Review of Systems   Review of Systems  All other systems reviewed and are negative.   Physical Exam Updated Vital Signs BP (!) 150/95 (BP Location: Right Arm)   Pulse 85   Temp 97.7 F (36.5 C) (Oral)   Resp 18   Ht 5\' 6"  (1.676 m)   Wt 102.1 kg   SpO2 99%   BMI 36.33 kg/m  Physical Exam Vitals and nursing note reviewed.  Constitutional:      General: He is not in acute distress.    Appearance: He is well-developed. He is obese.     Comments: Patient sitting in bed, talking the phone, in no acute discomfort.  HENT:     Head: Atraumatic.  Eyes:     Conjunctiva/sclera: Conjunctivae normal.  Cardiovascular:     Rate and Rhythm: Normal rate and regular rhythm.     Pulses: Normal pulses.     Heart sounds: Normal heart sounds.  Musculoskeletal:     Cervical back: Neck supple.  Skin:    Findings: No rash.  Neurological:     Mental Status: He is alert.     GCS: GCS eye subscore is 4. GCS verbal subscore is 5. GCS motor subscore is 6.  Psychiatric:        Speech: Speech normal.        Behavior: Behavior is cooperative.  Thought Content: Thought content is not paranoid. Thought content does not include homicidal ideation.     ED Results / Procedures / Treatments   Labs (all labs ordered are listed, but only abnormal results are displayed) Labs Reviewed  COMPREHENSIVE METABOLIC PANEL - Abnormal; Notable for the following components:      Result Value   CO2 21 (*)    Calcium 8.7 (*)    All other components within normal limits  ETHANOL - Abnormal; Notable for the following components:   Alcohol, Ethyl (B) 150 (*)    All other components within normal limits  CBC WITH DIFFERENTIAL/PLATELET - Abnormal; Notable for the following components:   Hemoglobin 10.5 (*)    HCT 34.4 (*)    MCV 77.5 (*)    MCH 23.6 (*)    RDW 17.7 (*)    All other components within normal limits  RAPID URINE DRUG SCREEN,  HOSP PERFORMED    EKG None  Radiology No results found.  Procedures Procedures    Medications Ordered in ED Medications  risperiDONE (RISPERDAL M-TABS) disintegrating tablet 2 mg (has no administration in time range)    And  LORazepam (ATIVAN) tablet 1 mg (has no administration in time range)    And  ziprasidone (GEODON) injection 20 mg (has no administration in time range)  acetaminophen (TYLENOL) tablet 650 mg (has no administration in time range)  zolpidem (AMBIEN) tablet 5 mg (has no administration in time range)  alum & mag hydroxide-simeth (MAALOX/MYLANTA) 200-200-20 MG/5ML suspension 30 mL (has no administration in time range)  ondansetron (ZOFRAN) tablet 4 mg (has no administration in time range)  nicotine (NICODERM CQ - dosed in mg/24 hours) patch 21 mg (has no administration in time range)  hydrochlorothiazide (HYDRODIURIL) tablet 25 mg (has no administration in time range)  lisinopril (ZESTRIL) tablet 20 mg (has no administration in time range)  pantoprazole (PROTONIX) EC tablet 40 mg (has no administration in time range)    ED Course/ Medical Decision Making/ A&P                           Medical Decision Making Amount and/or Complexity of Data Reviewed Labs: ordered.  Risk OTC drugs. Prescription drug management.   BP (!) 150/95 (BP Location: Right Arm)   Pulse 85   Temp 97.7 F (36.5 C) (Oral)   Resp 18   Ht 5\' 6"  (1.676 m)   Wt 102.1 kg   SpO2 99%   BMI 36.33 kg/m   48:55 PM  31 year old male with significant history of intermittent explosive disorder, ADHD, hypertension, brought here accompanied by [employer] for evaluation of suicidal ideation.  Patient reported he had a verbal argument with his wife over the phone and threatened to shoot himself.  His wife called police and who brought him here.  Patient admits forcing SI but does not have any intent to harm himself.  States he was anxious out of frustration.  He has never harmed himself in the past  and he denies any homicidal ideation.  Patient denies auditory or visual hallucination.  He denies any alcohol or drug use.  Denies any recent medication changes.  He is here voluntarily.  He denies any change in his sleeping or eating habit.  On exam, this is an obese male sitting in bed talking on phone appears to be in no acute discomfort.  [employer] is at bedside.  Heart and lungs with normal rate and rhythm lungs  are clear to auscultation abdomen soft nontender, patient moving all 4 extremities without difficulty.  He is cooperative without SI or HI.  He does not respond to internal stimuli.  -Labs ordered, independently viewed and interpreted by me.  Labs remarkable for normal WBC.  Hgh of 10.5 showing mild anemia. Alcohol is 150 -The patient was maintained on a cardiac monitor.  I personally viewed and interpreted the cardiac monitored which showed an underlying rhythm of: NSR -This patient presents to the ED for concern of SI, this involves an extensive number of treatment options, and is a complaint that carries with it a high risk of complications and morbidity.  The differential diagnosis includes suicidal ideation, frustration, mood disorder -Co morbidities that complicate the patient evaluation includes ADHD -Treatment includes PRN medications -Reevaluation of the patient after these medicines showed that the patient improved -PCP office notes or outside notes reviewed -request TTS and psychiatry to evaluate pt as he his medically cleared -Escalation to admission/observation considered: patient is agreeable to be evaluated by psych -Social Determinant of Health considered which includes tobacco use, recommend cessation.   10:48 PM Although alcohol is elevated at 150, patient is clinically sober, at this time he is medically cleared and can be assessed further by psychiatry.  CAre discussed with DR. Miller.         Final Clinical Impression(s) / ED Diagnoses Final diagnoses:  Suicidal  ideation    Rx / DC Orders ED Discharge Orders     None         Fayrene Helper, PA-C 09/18/22 2248    Eber Hong, MD 09/20/22 [phone]

## 2022-09-18 NOTE — ED Triage Notes (Signed)
Pt states he got into an argument with wife and said some things he regrets. Pt brought up here by [employer] voluntarily.

## 2022-09-18 NOTE — ED Notes (Signed)
ED Provider at bedside. 

## 2022-09-19 ENCOUNTER — Other Ambulatory Visit: Payer: Self-pay

## 2022-09-19 DIAGNOSIS — F309 Manic episode, unspecified: Secondary | ICD-10-CM

## 2022-09-19 DIAGNOSIS — F909 Attention-deficit hyperactivity disorder, unspecified type: Secondary | ICD-10-CM

## 2022-09-19 DIAGNOSIS — R45851 Suicidal ideations: Secondary | ICD-10-CM

## 2022-09-19 NOTE — BH Assessment (Signed)
Comprehensive Clinical Assessment (CCA) Note  09/19/2022 Joseph Blair [id number]  Disposition: Per Joseph Guadeloupe, NP, patient is recommended for overnight observation and will be reassessed in the morning.   The patient demonstrates the following risk factors for suicide: Chronic risk factors for suicide include: psychiatric disorder of ADHD . Acute risk factors for suicide include: family or marital conflict. Protective factors for this patient include: responsibility to others (children, family) and hope for the future. Considering these factors, the overall suicide risk at this point appears to be high. Patient is not appropriate for outpatient follow up.  Joseph Blair is a 31 year old male presenting to [facility] voluntarily with PD for evaluation for suicidal ideations. Patient reports he had a disagreement with his wife yesterday and reports he text his wife "I don't want to live if I'm not living with you". Patient denies making any statement or sending any text message that states that he was going to shoot himself. Patient guarded about the context of the disagreement however makes a general statement that the disagreement was about him and his family. Patient reports he was in the parking lot of his son karate practice and his wife was in the building waiting for practice to be over and that's when they were sending the text messages. Patient reports his wife decided to ride home with her friend, and he left practice to go home and when he got home the police arrived. Patient reports he had a loaded gun next to him when the police arrived. Patient adamantly reports that he does not want to kill himself and states "if I wanted to kill myself, I would have done so". Patient reports he was upset and made the statement due to being angry, but he did not have any intentions on killing himself. Patient reports he has registered guns at home that are locked in a safe and admits that he does have  access to the guns. When suggesting that the guns may have to be removed from the home, patient states "all that is not called for sir".   Patient denies having any stressors other than the recent argument with his wife. 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 Patient denies any financial issues or stress. Patient does not have any legal issues and he denies alcohol and drug use. Patient BAL was 150 and he reports he had one 16-ounce twisted tea. Patient reports he has on average once drink every couple of months. Patient lives at home with his wife of 10 years and their four children. Patient reports diagnosis of ADHD. Patient does not have outpatient services and he deny history of inpatient treatment.   Patient is oriented x4, engaged, alert and cooperative during assessment. Patient eye contact and speech is normal, patient is sitting upright in not acute distress. Patient mood and affect is appropriate however, patient becomes a bit agitated towards end of assessment when discussing treatment options. Patient denies SI, HI, AVH, SIB and drug use. Patient has never attempted suicide before. Patient reports protective factors of his wife and four children. Patient reports he has work in the morning and reports that he wants to go home after the assessment. Patient states "I'm not staying here all night".  TTS informed patient of the TTS process and provided opportunity for patient to ask questions or state concerns which patient did not have at that time. Patient consents for TTS to contact his wife Joseph Blair for collateral.  Wife reports that her and patient had a disagreement yesterday about finances which has been ongoing. Wife reports she and her daughter receive disability and patient works and makes good money, however, when she gets paid patient wants her to pay all the bills. 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 Wife reports the disagreement was earlier yesterday and when patient picked her up from dance class around 745pm, she noticed that patient had been crying. Wife reports that their son karate class is about two minutes from her dance class so when they got to the building, she got out the car and went in to wait for practice to be over. Wife reports patient was sending text messages about her not loving him anymore. Wife reports she decided to ride home with a friend because she did not want to argue anymore, and patient took the kids with him. Wife reports she received a message from patient stating that he was going to kill himself and shot himself in the head. Wife reports that she called the police because she knew patient had a gun on him and she did not want him to harm himself. Wife reports she showed the police officer the text messages. Wife states she doesn't think patient would have shot himself, but she did not want to take that chance. Wife reports that patient was mad and thinks he said it out of anger. Wife acknowledges that patient has guns at home and reports she can get patient mother to remove the guns if patient is discharged.  Wife does not have any safety concerns if patient was to return home.     Chief Complaint:  Chief Complaint  Patient presents with   V70.1   Visit Diagnosis: Suicidal Ideation    CCA Screening, Triage and Referral (STR)  Patient Reported Information How did you hear about [place]? Family/Friend  What Is the Reason for Your Visit/Call Today? 31 year old male with significant history of intermittent explosive disorder, ADHD, hypertension, brought here accompanied by [employer] for evaluation of suicidal ideation.  Patient reported he had a verbal argument with his wife over the phone and threatened to shoot himself.  His wife called police and who brought him here.  How Long Has This Been Causing You Problems? 1 wk - 1 month  What Do You Feel  Would Help You the Most Today? Treatment for Depression or other mood problem   Have You Recently Had Any Thoughts About Hurting Yourself? Yes  Are You Planning to Commit Suicide/Harm Yourself At This time? No   Flowsheet Row ED from 09/18/2022 in [facility] EM[facility]GENCY DEPARTMENT ED from 07/28/2022 in [facility] EMERGENCY DEPARTMENT ED from 06/25/2022 in [facility] [place]  C-SSRS RISK CATEGORY High Risk No Risk No Risk       Have you Recently Had Thoughts About Hurting Someone Karolee Ohs? No  Are You Planning to Harm Someone at This Time? No  Explanation: NA   Have You Used Any Alcohol or Drugs in the Past 24 Hours? Yes  What Did You Use and How Much? ALCOHOL 16 ounces   Do You Currently Have a Therapist/Psychiatrist? No  Name of Therapist/Psychiatrist: Name of Therapist/Psychiatrist: NA   Have You Been Recently Discharged From Any Office Practice or Programs? No  Explanation of Discharge From Practice/Program: NA     CCA Screening Triage Referral Assessment Type of Contact: Tele-Assessment  Telemedicine Service Delivery: Telemedicine  service delivery: This service was provided via telemedicine using a 2-way, interactive audio and video technology  Is this Initial or Reassessment? Is this Initial or Reassessment?: Initial Assessment  Date Telepsych consult ordered in [facility]:  Date Telepsych consult ordered in [facility]: 09/19/22  Time Telepsych consult ordered in [facility]:    Location of Assessment: [facility]  [facility]   Collateral Involvement: Wife   Does Patient Have a Automotive engineerCourt Appointed Legal Guardian? No  Legal Guardian Contact Information: NA  Copy of Legal Guardianship Form: -- (NA)  Legal Guardian Notified of Arrival: -- (NA)  Legal Guardian Notified of Pending Discharge: -- (NA)  If Minor and Not Living with Parent(s), Who has Custody? NA  Is CPS involved or ever been involved? -- (NA)  Is APS involved or ever  been involved? -- (NA)   Patient Determined To Be At Risk for Harm To Self or Others Based on Review of Patient Reported Information or Presenting Complaint? Yes, for Self-Harm  Method: No Plan  Availability of Means: Has close by  Intent: Vague intent or NA  Notification Required: No need or identified person  Additional Information for Danger to Others Potential: -- (NA)  Additional Comments for Danger to Others Potential: NA  Are There Guns or Other Weapons in Your Home? Yes  Types of Guns/Weapons: GUNS  Are These Weapons Safely Secured?                            Yes  Who Could Verify You Are Able To Have These Secured: Wife  Do You Have any Outstanding Charges, Pending Court Dates, Parole/Probation? NO  Contacted To Inform of Risk of Harm To Self or Others: Family/Significant Other:    Does Patient Present under Involuntary Commitment? No    [place]   Patient Currently Receiving the Following Services: Not Receiving Services   Determination of Need: Urgent (48 hours)   Options For Referral: Medication Management; Outpatient Therapy; Inpatient Hospitalization     CCA Biopsychosocial Patient Reported Schizophrenia/Schizoaffective Diagnosis in Past: No   Strengths: Family oriented   Mental Health Symptoms Depression:   None   Duration of Depressive symptoms:    Mania:   None   Anxiety:    None   Psychosis:   None   Duration of Psychotic symptoms:    Trauma:   None   Obsessions:   None   Compulsions:   None   Inattention:   None   Hyperactivity/Impulsivity:   None   Oppositional/Defiant Behaviors:   None   Emotional Irregularity:   None   Other Mood/Personality Symptoms:  No data recorded   Mental Status Exam Appearance and self-care  Stature:   Average   Weight:   Average weight   Clothing:   Neat/clean; Age-appropriate   Grooming:   Normal   Cosmetic use:   None   Posture/gait:    Normal   Motor activity:   Not Remarkable   Sensorium  Attention:   Normal   Concentration:   Normal   Orientation:   Person; Place; Situation   Recall/memory:   Normal   Affect and Mood  Affect:   Appropriate   Mood:   Euthymic   Relating  Eye contact:   Normal   Facial expression:   Responsive   Attitude toward examiner:   Guarded   Thought and Language  Speech flow:  Clear and Coherent  Thought content:   Appropriate to Mood and Circumstances   Preoccupation:   None   Hallucinations:   None   Organization:   Coherent; Goal-directed   [employer] of Knowledge:   Fair   Intelligence:   Average   Abstraction:   Normal   Judgement:   Fair   Dance movement psychotherapist:   Adequate   Insight:   Fair   Decision Making:   Normal   Social Functioning  Social Maturity:   Responsible   Social Judgement:   Normal   Stress  Stressors:   Family conflict; Financial   Coping Ability:   Normal   Skill Deficits:   None   Supports:   Family     Religion: Religion/Spirituality Are You A Religious Person?: Yes What is Your Religious Affiliation?: Christian How Might This Affect Treatment?: NA  Leisure/Recreation: Leisure / Recreation Do You Have Hobbies?: Yes Leisure and Hobbies: FISHING  Exercise/Diet: Exercise/Diet Do You Exercise?: No Have You Gained or Lost A Significant Amount of Weight in the Past Six Months?: No Do You Follow a Special Diet?: No Do You Have Any Trouble Sleeping?: No   CCA Employment/Education Employment/Work Situation: Employment / Work Situation Employment Situation: Employed Work Stressors: NONE REPORTED Patient's Job has Been Impacted by Current Illness: No Has Patient ever Been in Equities trader?: No  Education: Education Is Patient Currently Attending School?: No Last Grade Completed:  (NA) Did You Attend College?:  (UTA) Did You Have An Individualized Education Program (IIEP):   (NA) Did You Have Any Difficulty At School?:  (NA) Patient's Education Has Been Impacted by Current Illness:  (NA)   CCA Family/Childhood History Family and Relationship History: Family history Marital status: Married Number of Years Married: 10 What types of issues is patient dealing with in the relationship?: FINACIAL ISSUES Additional relationship information: NA Does patient have children?: Yes How many children?: 4  Childhood History:  Childhood History By whom was/is the patient raised?:  (UTA) Did patient suffer any verbal/emotional/physical/sexual abuse as a child?: No Did patient suffer from severe childhood neglect?: No Has patient ever been sexually abused/assaulted/raped as an adolescent or adult?: No Was the patient ever a victim of a crime or a disaster?: No Witnessed domestic violence?: No Has patient been affected by domestic violence as an adult?: No       CCA Substance Use Alcohol/Drug Use: Alcohol / Drug Use Pain Medications: SEE MAR Prescriptions: SEE MAR Over the Counter: SEE MAR History of alcohol / drug use?: No history of alcohol / drug abuse Longest period of sobriety (when/how long): NA Negative Consequences of Use:  (NA) Withdrawal Symptoms:  (NA)                         ASAM's:  Six Dimensions of Multidimensional Assessment  Dimension 1:  Acute Intoxication and/or Withdrawal Potential:   Dimension 1:  Description of individual's past and current experiences of substance use and withdrawal: NA  Dimension 2:  Biomedical Conditions and Complications:   Dimension 2:  Description of patient's biomedical conditions and  complications: NA  Dimension 3:  Emotional, Behavioral, or Cognitive Conditions and Complications:  Dimension 3:  Description of emotional, behavioral, or cognitive conditions and complications: NA  Dimension 4:  Readiness to Change:  Dimension 4:  Description of Readiness to Change criteria: NA  Dimension 5:  Relapse,  Continued use, or Continued Problem Potential:  Dimension 5:  Relapse, continued use, or  continued problem potential critiera description: NA  Dimension 6:  Recovery/Living Environment:  Dimension 6:  Recovery/Iiving environment criteria description: NA  ASAM Severity Score:    ASAM Recommended Level of Treatment: ASAM Recommended Level of Treatment:  (NA)   Substance use Disorder (SUD) Substance Use Disorder (SUD)  Checklist Symptoms of Substance Use:  (NA)  Recommendations for Services/Supports/Treatments: Recommendations for Services/Supports/Treatments Recommendations For Services/Supports/Treatments:  (NA)  Discharge Disposition: Discharge Disposition Medical Exam completed: Yes Disposition of Patient:  (OVERNIGHT OBSERVATION) Mode of transportation if patient is discharged/movement?: Other (comment)  DSM5 Diagnoses: Patient Active Problem List   Diagnosis Date Noted   Elevated LFTs 03/04/2021   Hypokalemia 03/04/2021   GERD (gastroesophageal reflux disease)    Hypertension    S/P ACL repair right 06/02/20 06/07/2020   Old complete tear of anterior cruciate ligament of right knee    Derangement of posterior horn of medial meniscus of right knee    Abnormal LFTs 02/02/2019   Acute myopericarditis 04/05/2016   Chest pain 04/05/2016   Elevated troponin 04/05/2016   Right ACL tear 11/16/2015   ACL (anterior cruciate ligament) rupture 05/05/2013   ADHD (attention deficit hyperactivity disorder), combined type 10/03/2011   Intermittent explosive disorder 10/03/2011     Referrals to Alternative Service(s): Referred to Alternative Service(s):   Place:   Date:   Time:    Referred to Alternative Service(s):   Place:   Date:   Time:    Referred to Alternative Service(s):   Place:   Date:   Time:    Referred to Alternative Service(s):   Place:   Date:   Time:     Audree Camel, [facility]

## 2022-09-19 NOTE — ED Notes (Signed)
Per law enforcement, patient was in vehicle with a loaded firearm.

## 2022-09-19 NOTE — ED Provider Notes (Addendum)
Emergency Medicine Observation Re-evaluation Note  Joseph Blair is a 31 y.o. male, seen on rounds today.  Pt initially presented to the ED for complaints of V70.1 Currently, the patient is sleeping.  Physical Exam  BP (!) 134/93 (BP Location: Left Arm)   Pulse 75   Temp 98.1 F (36.7 C) (Oral)   Resp 18   Ht 5\' 6"  (1.676 m)   Wt 102.1 kg   SpO2 99%   BMI 36.33 kg/m  Physical Exam General: Sleeping Cardiac: Extremities perfused Lungs: Breathing is unlabored Psych: Deferred  ED Course / MDM  EKG:   I have reviewed the labs performed to date as well as medications administered while in observation.  Recent changes in the last 24 hours include presentation to the emergency department last night for suicidal ideation.  He has been medically cleared.  TTS evaluated early this morning.  They recommend continued ED observation overnight and reassessment tomorrow morning.  Plan  Current plan is for TTS reassessment.  Following TTS reevaluation, patient was psychiatrically cleared.  Him and wife were contracting for safety.  Outpatient resources were provided.  Patient was discharged in stable condition.   , MD 09/19/22 14/06/23    1448, MD 09/19/22 [phone]

## 2022-09-19 NOTE — Discharge Instructions (Addendum)
Return to the emergency department at any time for any new or worsening symptoms of concern. 

## 2022-09-19 NOTE — ED Notes (Signed)
TTS completed. 

## 2022-09-19 NOTE — ED Notes (Signed)
Pt is on the phone with wife.

## 2022-09-19 NOTE — ED Notes (Signed)
Pt received breakfast tray 

## 2022-09-19 NOTE — ED Notes (Signed)
Pt is pacing around room agitated.

## 2022-09-19 NOTE — Consult Note (Signed)
Telepsych Consultation   Reason for Consult: Patient on multiple psychiatric meds, refusing oral meds and treatment  Referring Physician: Dr. Hanley Ben  Location of Patient:[facility]   Location of Provider: Behavioral Health TTS Department  Patient Identification: Joseph Blair  MRN:  [id number]  Principal Diagnosis: ADHD, intermittent explosive mood disorder. Diagnosis: Suicidal ideation  Total Time spent with patient: 30 minutes  Subjective:   Joseph Blair is a 31 y.o. male patient admitted with suicidal ideation in the context of argument with wife.  Assessment: Patient presents sitting on a chair at [facility], ED hospital.  He is seen and examined via telepsych.  Appears calm, pleasant and apologizes for what he texted to the wife.  Alert and oriented x 4.  Speech clear, coherent with normal volume and pattern.  Maintained good eye contact with this provider.  Present with euthymic mood and congruent affect.  Patient does not appear to be responding to internal or external stimuli.  No psychotic symptoms or mania noted.  Responds appropriately to the questions.  Endorses HPI below and added that he never intends to hurt his wife or himself.  However texted the wife about suicidal thoughts due to frustration and anxiety.  Reports always having a gun, which is registered in his car for protection.  Able to contract for safety.  Urine drug screen negative for any substance.  Blood alcohol level 150.  Patient reports that he drank 12 ounces of beer yesterday.  CBC: Hemoglobin 10.5, HCT 34.4, MCV 77.5, MCH 23.6, and RDW 17.7.  Patient to follow-up with outpatient PCP for all abnormal labs.  Patient denies SI, HI, AVH, SIB, or suicide attempt in the past.  He denies drug use, tobacco use, marijuana use, however endorses drinking 1 beer occasionally.  Instructions provided on cessation of substance use as it adversely affects overall psychiatric and mental wellbeing.  Endorsed sleeping over  8 hours last night and having good appetite with good hydration.  Endorses being safe at home and having firearms that are locked up from their 4 children.  Denies being followed by a psychiatrist or therapist at this time, however agreed to follow-up with outpatient therapist and counseling services.  Patient endorsed being employed and denies any legal issues.  Collateral information: Patient's wife [place] at [phone] called for more information per patient's permission.  [place] relates that the husband would not hurt himself or any of the family members.  Added that he is safe to return home and they will follow-up with a therapist and counseling services for his anger disorder.  HPI:  The history is provided by the patient and medical records. No language interpreter was used.     31 year old male with significant history of intermittent explosive disorder, ADHD, hypertension, brought here accompanied by [employer] for evaluation of suicidal ideation.  Patient reported he had a verbal argument with his wife over the phone and threatened to shoot himself.  His wife called police and who brought him here.  Patient admits forcing SI but does not have any intent to harm himself.  States he was anxious out of frustration.  He has never harmed himself in the past and he denies any homicidal ideation.  Patient denies auditory or visual hallucination.  He denies any alcohol or drug use.  Denies any recent medication changes.  He is here voluntarily.  He denies any change in his sleeping or eating habit.   Disposition: Patient does not meet the criteria for inpatient psychiatric admission  based on my assessment.  He is psych cleared.  Forestine Na, ED treatment team and Forestine Na, ED physician made aware of patient disposition.  Supportive therapy provided about ongoing stressors.  Discussed safety and crisis plans, support from social network, calling 911, going to the nearest emergency department and  calling suicide hotline.  Past Psychiatric History: ADHD, intermittent explosive mood disorder  Risk to Self:  No Risk to Others: No  Prior Inpatient Therapy:  No Prior Outpatient Therapy:  No  Past Medical History:  Past Medical History:  Diagnosis Date   ADHD (attention deficit hyperactivity disorder)    GERD (gastroesophageal reflux disease)    Hypertension    Pancreas disorder 2013   patient reports he was told he might have pancreatitis. No elevated lipase or imaging to support per EPIC review   Pancreatitis     Past Surgical History:  Procedure Laterality Date   ANTERIOR CRUCIATE LIGAMENT REPAIR     Left knee Dr. Garfield Cornea   ANTERIOR CRUCIATE LIGAMENT REPAIR Right 06/02/2020   Procedure: RIGHT ANTERIOR CRUCIATE LIGAMENT (ACL) REPAIR WITH ALLOGRAFT;  Surgeon: Carole Civil, MD;  Location: [facility];  Service: Orthopedics;  Laterality: Right;   HERNIA REPAIR     KNEE ARTHROSCOPY WITH MEDIAL MENISECTOMY Right 06/02/2020   Procedure: RIGHT KNEE ARTHROSCOPY WITH MEDIAL MENISECTOMY;  Surgeon: Carole Civil, MD;  Location: [facility];  Service: Orthopedics;  Laterality: Right;   Family History:  Family History  Problem Relation Age of Onset   ADD / ADHD Brother    ADD / ADHD Maternal Aunt    Drug abuse Maternal Uncle    Alcohol abuse Maternal Uncle    Schizophrenia Maternal Uncle    Colon cancer Paternal Aunt        66 years old   Anxiety disorder Neg Hx    Bipolar disorder Neg Hx    Dementia Neg Hx    Depression Neg Hx    OCD Neg Hx    Paranoid behavior Neg Hx    Seizures Neg Hx    Sexual abuse Neg Hx    Physical abuse Neg Hx    Liver disease Neg Hx    Family Psychiatric  History: None indicated  Social History:  Social History   Substance and Sexual Activity  Alcohol Use Not Currently   Comment: denies use 10/14     Social History   Substance and Sexual Activity  Drug Use Not Currently   Types: Marijuana   Comment: denies use 10/14    Social  History   Socioeconomic History   Marital status: Married    Spouse name: Not on file   Number of children: Not on file   Years of education: Not on file   Highest education level: Not on file  Occupational History   Not on file  Tobacco Use   Smoking status: Some Days    Types: Cigars   Smokeless tobacco: Never  Vaping Use   Vaping Use: Never used  Substance and Sexual Activity   Alcohol use: Not Currently    Comment: denies use 10/14   Drug use: Not Currently    Types: Marijuana    Comment: denies use 10/14   Sexual activity: Yes  Other Topics Concern   Not on file  Social History Narrative   Not on file   Social Determinants of Health   Financial Resource Strain: Not on file  Food Insecurity: Not on file  Transportation Needs: Not on file  Physical Activity: Not on file  Stress: Not on file  Social Connections: Not on file   Additional Social History:    Allergies:   Allergies  Allergen Reactions   Bee Venom Swelling    Labs:  Results for orders placed or performed during the hospital encounter of 09/18/22 (from the past 48 hour(s))  Comprehensive metabolic panel     Status: Abnormal   Collection Time: 09/18/22 10:15 PM  Result Value Ref Range   Sodium 141 135 - 145 mmol/L   Potassium 3.8 3.5 - 5.1 mmol/L   Chloride 108 98 - 111 mmol/L   CO2 21 (L) 22 - 32 mmol/L   Glucose, Bld 98 70 - 99 mg/dL    Comment: Glucose reference range applies only to samples taken after fasting for at least 8 hours.   BUN 10 6 - 20 mg/dL   Creatinine, Ser 0.77 0.61 - 1.24 mg/dL   Calcium 8.7 (L) 8.9 - 10.3 mg/dL   Total Protein 7.9 6.5 - 8.1 g/dL   Albumin 4.1 3.5 - 5.0 g/dL   AST 35 15 - 41 U/L   ALT 44 0 - 44 U/L   Alkaline Phosphatase 92 38 - 126 U/L   Total Bilirubin 0.3 0.3 - 1.2 mg/dL   GFR, Estimated >60 >60 mL/min    Comment: (NOTE) Calculated using the CKD-EPI Creatinine Equation (2021)    Anion gap 12 5 - 15    Comment: Performed at [facility],  [address], [place], [place] [address]  Ethanol     Status: Abnormal   Collection Time: 09/18/22 10:15 PM  Result Value Ref Range   Alcohol, Ethyl (B) 150 (H) <10 mg/dL    Comment: (NOTE) Lowest detectable limit for serum alcohol is 10 mg/dL.  For medical purposes only. Performed at [facility], [address] [place], [place] [address]   CBC with Diff     Status: Abnormal   Collection Time: 09/18/22 10:15 PM  Result Value Ref Range   WBC 9.3 4.0 - 10.5 K/uL   RBC 4.44 4.22 - 5.81 MIL/uL   Hemoglobin 10.5 (L) 13.0 - 17.0 g/dL   HCT 34.4 (L) 39.0 - 52.0 %   MCV 77.5 (L) 80.0 - 100.0 fL   MCH 23.6 (L) 26.0 - 34.0 pg   MCHC 30.5 30.0 - 36.0 g/dL   RDW 17.7 (H) 11.5 - 15.5 %   Platelets 297 150 - 400 K/uL   nRBC 0.0 0.0 - 0.2 %   Neutrophils Relative % 47 %   Neutro Abs 4.4 1.7 - 7.7 K/uL   Lymphocytes Relative 43 %   Lymphs Abs 4.0 0.7 - 4.0 K/uL   Monocytes Relative 9 %   Monocytes Absolute 0.8 0.1 - 1.0 K/uL   Eosinophils Relative 1 %   Eosinophils Absolute 0.1 0.0 - 0.5 K/uL   Basophils Relative 0 %   Basophils Absolute 0.0 0.0 - 0.1 K/uL   Immature Granulocytes 0 %   Abs Immature Granulocytes 0.03 0.00 - 0.07 K/uL    Comment: Performed at [facility], [address] [place], [place] [address]  Urine rapid drug screen (hosp performed)     Status: None   Collection Time: 09/18/22 10:42 PM  Result Value Ref Range   Opiates NONE DETECTED NONE DETECTED   Cocaine NONE DETECTED NONE DETECTED   Benzodiazepines NONE DETECTED NONE DETECTED   Amphetamines NONE DETECTED NONE DETECTED   Tetrahydrocannabinol NONE DETECTED NONE DETECTED   Barbiturates NONE  DETECTED NONE DETECTED    Comment: (NOTE) DRUG SCREEN FOR MEDICAL PURPOSES ONLY.  IF CONFIRMATION IS NEEDED FOR ANY PURPOSE, NOTIFY LAB WITHIN 5 DAYS.  LOWEST DETECTABLE LIMITS FOR URINE DRUG SCREEN Drug Class                     Cutoff (ng/mL) Amphetamine and metabolites    1000 Barbiturate and metabolites     200 Benzodiazepine                 200 Opiates and metabolites        300 Cocaine and metabolites        300 THC                            50 Performed at [facility], [address], [place], [place] [address]     Medications:  Current Facility-Administered Medications  Medication Dose Route Frequency Provider Last Rate Last Admin   acetaminophen (TYLENOL) tablet 650 mg  650 mg Oral Q4H PRN Domenic Moras, PA-C       alum & mag hydroxide-simeth (MAALOX/MYLANTA) 200-200-20 MG/5ML suspension 30 mL  30 mL Oral Q6H PRN Domenic Moras, PA-C       hydrochlorothiazide (HYDRODIURIL) tablet 25 mg  25 mg Oral Daily Domenic Moras, PA-C   25 mg at 09/19/22 1035   lisinopril (ZESTRIL) tablet 20 mg  20 mg Oral Daily Domenic Moras, PA-C   20 mg at 09/19/22 1035   risperiDONE (RISPERDAL M-TABS) disintegrating tablet 2 mg  2 mg Oral Q8H PRN Domenic Moras, PA-C       And   LORazepam (ATIVAN) tablet 1 mg  1 mg Oral PRN Domenic Moras, PA-C       And   ziprasidone (GEODON) injection 20 mg  20 mg Intramuscular PRN Domenic Moras, PA-C       nicotine (NICODERM CQ - dosed in mg/24 hours) patch 21 mg  21 mg Transdermal Daily Domenic Moras, PA-C       ondansetron [facility]) tablet 4 mg  4 mg Oral Q8H PRN Domenic Moras, PA-C       pantoprazole (PROTONIX) EC tablet 40 mg  40 mg Oral Daily Domenic Moras, PA-C   40 mg at 09/19/22 1035   zolpidem (AMBIEN) tablet 5 mg  5 mg Oral QHS PRN Domenic Moras, PA-C       Current Outpatient Medications  Medication Sig Dispense Refill   atorvastatin (LIPITOR) 20 MG tablet Take 20 mg by mouth daily.     hydrochlorothiazide (HYDRODIURIL) 25 MG tablet Take 25 mg by mouth daily.     lisinopril (ZESTRIL) 20 MG tablet Take 20 mg by mouth daily.     omeprazole (PRILOSEC) 20 MG capsule Take 20 mg by mouth daily.     pantoprazole (PROTONIX) 40 MG tablet Take 1 tablet (40 mg total) by mouth daily. 30 tablet 0   benzonatate (TESSALON) 100 MG capsule Take 1 capsule (100 mg total) by mouth 3 (three) times daily  as needed for cough. Do not take with alcohol or while driving or operating heavy machinery (Patient not taking: Reported on 07/28/2022) 21 capsule 0   polyethylene glycol powder (GLYCOLAX/MIRALAX) 17 GM/SCOOP powder Take 17 g by mouth daily. (Patient not taking: Reported on 09/19/2022) 255 g 0    Musculoskeletal: Strength & Muscle Tone: within normal limits Gait & Station: normal Patient leans: N/A  Psychiatric Specialty Exam:  Presentation  General  Appearance:  Appropriate for Environment; Casual; Fairly Groomed  Eye Contact: Good  Speech: Clear and Coherent; Normal Rate  Speech Volume: Normal  Handedness: Right  Mood and Affect  Mood: Euthymic  Affect: Appropriate; Congruent  Thought Process  Thought Processes: Coherent; Goal Directed  Descriptions of Associations:Intact  Orientation:Full (Time, Place and Person)  Thought Content:Logical  History of Schizophrenia/Schizoaffective disorder:No  Duration of Psychotic Symptoms:No data recorded Hallucinations:Hallucinations: None  Ideas of Reference:None  Suicidal Thoughts:Suicidal Thoughts: No  Homicidal Thoughts:Homicidal Thoughts: No  Sensorium  Memory: Immediate Good; Recent Good  Judgment: Fair  Insight: Fair  Community education officer  Concentration: Good  Attention Span: Good  Recall: Good  Fund of Knowledge: Good  Language: Good  Psychomotor Activity  Psychomotor Activity: Psychomotor Activity: Normal  Assets  Assets: Communication Skills; Desire for Improvement; Physical Health; Social Support  Sleep  Sleep: Sleep: Good Number of Hours of Sleep: 8  Physical Exam: Physical Exam Vitals and nursing note reviewed.   Review of Systems  Constitutional: Negative.   HENT: Negative.    Eyes: Negative.   Respiratory: Negative.    Cardiovascular: Negative.        Blood pressure 134/93, pulse 75.  Nursing staff to recheck vital signs.  Musculoskeletal: Negative.   Skin:  Negative.   Neurological: Negative.   Endo/Heme/Allergies: Negative.   Psychiatric/Behavioral: Negative.     Blood pressure (!) 134/93, pulse 75, temperature 98.1 F (36.7 C), temperature source Oral, resp. rate 18, height 5\' 6"  (1.676 m), weight 102.1 kg, SpO2 99 %. Body mass index is 36.33 kg/m.  Treatment Plan Summary: --Patient is discharged to home. --Patient to follow-up with outpatient counseling and therapy services.  Disposition:  No evidence of imminent risk to self or others at present.   Patient does not meet criteria for psychiatric inpatient admission. Supportive therapy provided about ongoing stressors. Discussed crisis plan, support from social network, calling 911, coming to the Emergency Department, and calling Suicide Hotline.  This service was provided via telemedicine using a 2-way, interactive audio and video technology.  Names of all persons participating in this telemedicine service and their role in this encounter. Name: Galen Manila Role: Patient  Name: Garrison Columbus Role: NP provider  Name: Dr. Dwyane Dee Role: Medical Director  Name: Dr. Starla Link Role: The patient ED physician    Laretta Bolster, FNP 09/19/2022 11:15 AM

## 2022-11-01 ENCOUNTER — Encounter (INDEPENDENT_AMBULATORY_CARE_PROVIDER_SITE_OTHER): Payer: Self-pay | Admitting: Family Medicine

## 2022-12-13 ENCOUNTER — Encounter: Payer: Self-pay | Admitting: Radiology

## 2022-12-26 ENCOUNTER — Other Ambulatory Visit: Payer: Self-pay

## 2022-12-26 ENCOUNTER — Ambulatory Visit
Admission: EM | Admit: 2022-12-26 | Discharge: 2022-12-26 | Disposition: A | Discharge status: Home / Self Care | Payer: Medicaid Other | Attending: Family Medicine | Admitting: Family Medicine

## 2022-12-26 DIAGNOSIS — R0789 Other chest pain: Secondary | ICD-10-CM

## 2022-12-26 MED ORDER — TIZANIDINE HCL 2 MG PO CAPS: 2.0000 mg | ORAL_CAPSULE | Freq: Three times a day (TID) | ORAL | 0 refills | Status: AC | PRN

## 2022-12-26 MED ORDER — NAPROXEN 500 MG PO TABS: 500.0000 mg | ORAL_TABLET | Freq: Two times a day (BID) | ORAL | 0 refills | Status: DC | PRN

## 2022-12-26 MED ORDER — NAPROXEN 500 MG PO TABS: 500.0000 mg | ORAL_TABLET | Freq: Two times a day (BID) | ORAL | 0 refills | Status: AC | PRN

## 2022-12-26 MED ORDER — TIZANIDINE HCL 2 MG PO CAPS: 2.0000 mg | ORAL_CAPSULE | Freq: Three times a day (TID) | ORAL | 0 refills | Status: DC | PRN

## 2022-12-26 NOTE — ED Triage Notes (Signed)
Pt reports he had a headache, dizziness, SOB and middle area chest pain that comes and goes and tingles x 2 hrs ago. Took ibuprofen about 3pm .

## 2022-12-26 NOTE — ED Provider Notes (Signed)
[facility]    CSN: [id number] Arrival date & time: 12/26/22  1716      History   Chief Complaint Chief Complaint  Patient presents with   Chest Pain    HPI Joseph Blair is a 32 y.o. male.   Patient presenting today with several hour history of headache, mid chest soreness, shortness of breath that started several hours prior to arrival.  Denies palpitations, diaphoresis, nausea, vomiting, wheezing, abdominal pain, upper respiratory symptoms.  So far not trying anything for symptoms.  Denies any history of cardiac or pulmonary issues.    Past Medical History:  Diagnosis Date   ADHD (attention deficit hyperactivity disorder)    GERD (gastroesophageal reflux disease)    Hypertension    Pancreas disorder 2013   patient reports he was told he might have pancreatitis. No elevated lipase or imaging to support per EPIC review   Pancreatitis     Patient Active Problem List   Diagnosis Date Noted   Elevated LFTs 03/04/2021   Hypokalemia 03/04/2021   GERD (gastroesophageal reflux disease)    Hypertension    S/P ACL repair right 06/02/20 06/07/2020   Old complete tear of anterior cruciate ligament of right knee    Derangement of posterior horn of medial meniscus of right knee    Abnormal LFTs 02/02/2019   Acute myopericarditis 04/05/2016   Chest pain 04/05/2016   Elevated troponin 04/05/2016   Right ACL tear 11/16/2015   ACL (anterior cruciate ligament) rupture 05/05/2013   ADHD (attention deficit hyperactivity disorder), 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 Garfield Cornea   ANTERIOR CRUCIATE LIGAMENT REPAIR Right 06/02/2020   Procedure: RIGHT ANTERIOR CRUCIATE LIGAMENT (ACL) REPAIR WITH ALLOGRAFT;  Surgeon: Carole Civil, MD;  Location: [facility];  Service: Orthopedics;  Laterality: Right;   HERNIA REPAIR     KNEE  ARTHROSCOPY WITH MEDIAL MENISECTOMY Right 06/02/2020   Procedure: RIGHT KNEE ARTHROSCOPY WITH MEDIAL MENISECTOMY;  Surgeon: Carole Civil, MD;  Location: [facility];  Service: Orthopedics;  Laterality: Right;       Home Medications    Prior to Admission medications   Medication Sig Start Date End Date Taking? Authorizing Provider  atorvastatin (LIPITOR) 20 MG tablet Take 20 mg by mouth daily. 07/12/22   [provider]  benzonatate (TESSALON) 100 MG capsule Take 1 capsule (100 mg total) by mouth 3 (three) times daily as needed for cough. Do not take with alcohol or while driving or operating heavy machinery Patient not taking: Reported on 07/28/2022 06/25/22   Eulogio Bear, NP  hydrochlorothiazide (HYDRODIURIL) 25 MG tablet Take 25 mg by mouth daily. 02/18/20   [provider]  lisinopril (ZESTRIL) 20 MG tablet Take 20 mg by mouth daily. 01/05/21   [provider]  naproxen (NAPROSYN) 500 MG tablet Take 1 tablet (500 mg total) by mouth 2 (two) times daily as needed. 12/26/22   Volney American, PA-C  omeprazole (PRILOSEC) 20 MG capsule Take 20 mg by mouth daily. 09/12/22   [provider]  pantoprazole (PROTONIX) 40 MG tablet Take 1 tablet (40 mg total) by mouth daily. 07/28/22   Idol, Almyra Free, PA-C  polyethylene glycol powder (GLYCOLAX/MIRALAX) 17 GM/SCOOP powder Take 17 g by mouth daily. Patient not taking: Reported on 09/19/2022 07/28/22   Evalee Jefferson, PA-C  tizanidine (ZANAFLEX) 2 MG capsule Take  1 capsule (2 mg total) by mouth 3 (three) times daily as needed for muscle spasms. Do not drink alcohol or drive while taking this medication.  May cause drowsiness. 12/26/22   Volney American, PA-C    Family History Family History  Problem Relation Age of Onset   ADD / ADHD Brother    ADD / ADHD Maternal Aunt    Drug abuse Maternal Uncle    Alcohol abuse Maternal Uncle    Schizophrenia Maternal Uncle    Colon cancer Paternal Aunt        38  years old   Anxiety disorder Neg Hx    Bipolar disorder Neg Hx    Dementia Neg Hx    Depression Neg Hx    OCD Neg Hx    Paranoid behavior Neg Hx    Seizures Neg Hx    Sexual abuse Neg Hx    Physical abuse Neg Hx    Liver disease Neg Hx     Social History Social History   Tobacco Use   Smoking status: Some Days    Types: Cigars   Smokeless tobacco: Never  Vaping Use   Vaping Use: Never used  Substance Use Topics   Alcohol use: Not Currently    Comment: denies use 10/14   Drug use: Not Currently    Types: Marijuana    Comment: denies use 10/14     Allergies   Bee venom   Review of Systems Review of Systems Per HPI  Physical Exam Triage Vital Signs ED Triage Vitals  Enc Vitals Group     BP 12/26/22 1719 (!) 147/101     Pulse Rate 12/26/22 1719 93     Resp --      Temp 12/26/22 1719 98 F (36.7 C)     Temp Source 12/26/22 1719 Oral     SpO2 12/26/22 1719 98 %     Weight --      Height --      Head Circumference --      Peak Flow --      Pain Score 12/26/22 1721 4     Pain Loc --      Pain Edu? --      Excl. in [place]? --    No data found.  Updated Vital Signs BP (!) 147/101 (BP Location: Right Arm)   Pulse 93   Temp 98 F (36.7 C) (Oral)   SpO2 98%   Visual Acuity Right Eye Distance:   Left Eye Distance:   Bilateral Distance:    Right Eye Near:   Left Eye Near:    Bilateral Near:     Physical Exam Vitals and nursing note reviewed.  Constitutional:      Appearance: Normal appearance.  HENT:     Head: Atraumatic.     Mouth/Throat:     Mouth: Mucous membranes are moist.  Eyes:     Extraocular Movements: Extraocular movements intact.     Conjunctiva/sclera: Conjunctivae normal.  Cardiovascular:     Rate and Rhythm: Normal rate and regular rhythm.     Heart sounds: Normal heart sounds.  Pulmonary:     Effort: Pulmonary effort is normal.     Breath sounds: Normal breath sounds. No wheezing or rales.  Musculoskeletal:        General:  Normal range of motion.     Cervical back: Normal range of motion and neck supple.     Comments: Right pectoral and substernal chest wall  tenderness to palpation, reproducing the pain he is feeling.  Skin:    General: Skin is warm and dry.  Neurological:     General: No focal deficit present.     Mental Status: He is oriented to person, place, and time.     Motor: No weakness.     Gait: Gait normal.  Psychiatric:        Mood and Affect: Mood normal.        Thought Content: Thought content normal.        Judgment: Judgment normal.      UC Treatments / Results  Labs (all labs ordered are listed, but only abnormal results are displayed) Labs Reviewed - No data to display  EKG   Radiology No results found.  Procedures Procedures (including critical care time)  Medications Ordered in UC Medications - No data to display  Initial Impression / Assessment and Plan / UC Course  I have reviewed the triage vital signs and the nursing notes.  Pertinent labs & imaging results that were available during my care of the patient were reviewed by me and considered in my medical decision making (see chart for details).     Given tenderness to palpation, worsening with movement and taking a deep breath do suspect a muscular etiology of his chest pain at this time.  His EKG today is reassuring, normal sinus rhythm at 92 bpm without any acute ST or T wave changes.  His vital signs apart from mildly elevated blood pressure are within normal limits and he is very well-appearing and in no acute distress.  Will treat with naproxen, Zanaflex, heat, massage, rest and knows to go the emergency department for any worsening symptoms.  Work note given.  Final Clinical Impressions(s) / UC Diagnoses   Final diagnoses:  Chest wall pain     Discharge Instructions      You may apply heat to the chest wall, gentle massage, take the medications prescribed and rest, avoid heavy lifting.  Follow-up for  any worsening symptoms.    ED Prescriptions     Medication Sig Dispense Auth. Provider   tizanidine (ZANAFLEX) 2 MG capsule  (Status: Discontinued) Take 1 capsule (2 mg total) by mouth 3 (three) times daily as needed for muscle spasms. Do not drink alcohol or drive while taking this medication.  May cause drowsiness. 15 capsule Volney American, [place]   naproxen (NAPROSYN) 500 MG tablet  (Status: Discontinued) Take 1 tablet (500 mg total) by mouth 2 (two) times daily as needed. 20 tablet Volney American, [place]   naproxen (NAPROSYN) 500 MG tablet Take 1 tablet (500 mg total) by mouth 2 (two) times daily as needed. 20 tablet Volney American, [place]   tizanidine (ZANAFLEX) 2 MG capsule Take 1 capsule (2 mg total) by mouth 3 (three) times daily as needed for muscle spasms. Do not drink alcohol or drive while taking this medication.  May cause drowsiness. 15 capsule Volney American, [place]      PDMP not reviewed this encounter.   Volney American, [place] 12/26/22 1933

## 2022-12-26 NOTE — Discharge Instructions (Signed)
You may apply heat to the chest wall, gentle massage, take the medications prescribed and rest, avoid heavy lifting.  Follow-up for any worsening symptoms.

## 2023-02-28 ENCOUNTER — Telehealth: Payer: Self-pay

## 2023-02-28 NOTE — Telephone Encounter (Signed)
Received voicemail from patient wishing to schedule a vasectomy consults. Returned patient call. No answer. No way to leave voicemail

## 2023-09-23 NOTE — Progress Notes (Signed)
09/24/2023 12:20 PM   Joseph Blair June 27, 1991 [id number]  Referring provider: Benetta Spar, MD [address] [place],  [place] [address]  No chief complaint on file.   HPI:    PMH: Past Medical History:  Diagnosis Date   ADHD (attention deficit hyperactivity disorder)    GERD (gastroesophageal reflux disease)    Hypertension    Pancreas disorder 2013   patient reports he was told he might have pancreatitis. No elevated lipase or imaging to support per EPIC review   Pancreatitis     Surgical History: Past Surgical History:  Procedure Laterality Date   ANTERIOR CRUCIATE LIGAMENT REPAIR     Left knee Dr. Edger House   ANTERIOR CRUCIATE LIGAMENT REPAIR Right 06/02/2020   Procedure: RIGHT ANTERIOR CRUCIATE LIGAMENT (ACL) REPAIR WITH ALLOGRAFT;  Surgeon: Vickki Hearing, MD;  Location: [facility];  Service: Orthopedics;  Laterality: Right;   HERNIA REPAIR     KNEE ARTHROSCOPY WITH MEDIAL MENISECTOMY Right 06/02/2020   Procedure: RIGHT KNEE ARTHROSCOPY WITH MEDIAL MENISECTOMY;  Surgeon: Vickki Hearing, MD;  Location: [facility];  Service: Orthopedics;  Laterality: Right;    Home Medications:  Allergies as of 09/24/2023       Reactions   Bee Venom Swelling        Medication List        Accurate as of September 23, 2023 12:20 PM. If you have any questions, ask your nurse or doctor.          atorvastatin 20 MG tablet Commonly known as: LIPITOR Take 20 mg by mouth daily.   benzonatate 100 MG capsule Commonly known as: TESSALON Take 1 capsule (100 mg total) by mouth 3 (three) times daily as needed for cough. Do not take with alcohol or while driving or operating heavy machinery   hydrochlorothiazide 25 MG tablet Commonly known as: HYDRODIURIL Take 25 mg by mouth daily.   lisinopril 20 MG tablet Commonly known as: ZESTRIL Take 20 mg by mouth daily.   naproxen 500 MG tablet Commonly known as: NAPROSYN Take 1 tablet (500 mg total) by  mouth 2 (two) times daily as needed.   omeprazole 20 MG capsule Commonly known as: PRILOSEC Take 20 mg by mouth daily.   pantoprazole 40 MG tablet Commonly known as: PROTONIX Take 1 tablet (40 mg total) by mouth daily.   polyethylene glycol powder 17 GM/SCOOP powder Commonly known as: GLYCOLAX/MIRALAX Take 17 g by mouth daily.   tizanidine 2 MG capsule Commonly known as: Zanaflex Take 1 capsule (2 mg total) by mouth 3 (three) times daily as needed for muscle spasms. Do not drink alcohol or drive while taking this medication.  May cause drowsiness.        Allergies:  Allergies  Allergen Reactions   Bee Venom Swelling    Family History: Family History  Problem Relation Age of Onset   ADD / ADHD Brother    ADD / ADHD Maternal Aunt    Drug abuse Maternal Uncle    Alcohol abuse Maternal Uncle    Schizophrenia Maternal Uncle    Colon cancer Paternal Aunt        33 years old   Anxiety disorder Neg Hx    Bipolar disorder Neg Hx    Dementia Neg Hx    Depression Neg Hx    OCD Neg Hx    Paranoid behavior Neg Hx    Seizures Neg Hx    Sexual abuse Neg Hx    Physical  abuse Neg Hx    Liver disease Neg Hx     Social History:  reports that he has been smoking cigars. He has never used smokeless tobacco. He reports that he does not currently use alcohol. He reports that he does not currently use drugs after having used the following drugs: Marijuana.  ROS: All other review of systems were reviewed and are negative except what is noted above in HPI  Physical Exam: There were no vitals taken for this visit.  Constitutional:  Alert and oriented, No acute distress. HEENT: [place] AT, moist mucus membranes.  Trachea midline, no masses. Cardiovascular: No clubbing, cyanosis, or edema. Respiratory: Normal respiratory effort, no increased work of breathing. GI: Abdomen is soft, nontender, nondistended, no abdominal masses GU: No CVA tenderness. Circumcised phallus. No masses/lesions on  penis, testis, scrotum. Prostate 40g smooth no nodules no induration.  Lymph: No cervical or inguinal lymphadenopathy. Skin: No rashes, bruises or suspicious lesions. Neurologic: Grossly intact, no focal deficits, moving all 4 extremities. Psychiatric: Normal mood and affect.  Laboratory Data: Lab Results  Component Value Date   WBC 9.3 09/18/2022   HGB 10.5 (L) 09/18/2022   HCT 34.4 (L) 09/18/2022   MCV 77.5 (L) 09/18/2022   PLT 297 09/18/2022    Lab Results  Component Value Date   CREATININE 0.77 09/18/2022    No results found for: "PSA"  No results found for: "TESTOSTERONE"  No results found for: "HGBA1C"  Urinalysis    Component Value Date/Time   COLORURINE YELLOW 07/28/2022 1402   APPEARANCEUR CLEAR 07/28/2022 1402   LABSPEC 1.012 07/28/2022 1402   PHURINE 7.0 07/28/2022 1402   GLUCOSEU NEGATIVE 07/28/2022 1402   HGBUR NEGATIVE 07/28/2022 1402   BILIRUBINUR NEGATIVE 07/28/2022 1402   KETONESUR NEGATIVE 07/28/2022 1402   PROTEINUR NEGATIVE 07/28/2022 1402   UROBILINOGEN 0.2 03/02/2014 1930   NITRITE NEGATIVE 07/28/2022 1402   LEUKOCYTESUR NEGATIVE 07/28/2022 1402    Lab Results  Component Value Date   BACTERIA FEW (A) 06/29/2016      Assessment & Plan:    There are no diagnoses linked to this encounter.  No follow-ups on file.  Chelsea Aus, MD  [facility] [place]

## 2023-09-24 ENCOUNTER — Ambulatory Visit (INDEPENDENT_AMBULATORY_CARE_PROVIDER_SITE_OTHER): Payer: BC Managed Care – PPO | Admitting: Urology

## 2023-09-24 ENCOUNTER — Ambulatory Visit: Payer: Medicaid Other | Admitting: Urology

## 2023-09-24 ENCOUNTER — Encounter: Payer: Self-pay | Admitting: Urology

## 2023-09-24 VITALS — BP 125/87 | HR 85

## 2023-09-24 DIAGNOSIS — Z3009 Encounter for other general counseling and advice on contraception: Secondary | ICD-10-CM | POA: Diagnosis not present

## 2023-11-19 ENCOUNTER — Ambulatory Visit: Payer: Self-pay

## 2023-12-06 ENCOUNTER — Other Ambulatory Visit: Payer: Self-pay

## 2023-12-06 ENCOUNTER — Emergency Department (HOSPITAL_COMMUNITY)
Admission: EM | Admit: 2023-12-06 | Discharge: 2023-12-07 | Disposition: A | Discharge status: Home / Self Care | Payer: BLUE CROSS/BLUE SHIELD | Attending: Emergency Medicine | Admitting: Emergency Medicine

## 2023-12-06 ENCOUNTER — Encounter (HOSPITAL_COMMUNITY): Payer: Self-pay

## 2023-12-06 DIAGNOSIS — K625 Hemorrhage of anus and rectum: Secondary | ICD-10-CM | POA: Insufficient documentation

## 2023-12-06 LAB — CBC
HCT: 32.1 % — ABNORMAL LOW (ref 39.0–52.0)
Hemoglobin: 9.7 g/dL — ABNORMAL LOW (ref 13.0–17.0)
MCH: 22.7 pg — ABNORMAL LOW (ref 26.0–34.0)
MCHC: 30.2 g/dL (ref 30.0–36.0)
MCV: 75.2 fL — ABNORMAL LOW (ref 80.0–100.0)
Platelets: 259 10*3/uL (ref 150–400)
RBC: 4.27 MIL/uL (ref 4.22–5.81)
RDW: 18.9 % — ABNORMAL HIGH (ref 11.5–15.5)
WBC: 9.2 10*3/uL (ref 4.0–10.5)
nRBC: 0 % (ref 0.0–0.2)

## 2023-12-06 NOTE — ED Triage Notes (Signed)
POV from home cc of rectal bleeding.  Occasional bright red stool  Started around 730pm. Says he strains a lot to use the restroom.

## 2023-12-07 LAB — TYPE AND SCREEN
ABO/RH(D): O POS
Antibody Screen: NEGATIVE

## 2023-12-07 LAB — COMPREHENSIVE METABOLIC PANEL
ALT: 21 U/L (ref 0–44)
AST: 23 U/L (ref 15–41)
Albumin: 4.5 g/dL (ref 3.5–5.0)
Alkaline Phosphatase: 94 U/L (ref 38–126)
Anion gap: 12 (ref 5–15)
BUN: 13 mg/dL (ref 6–20)
CO2: 21 mmol/L — ABNORMAL LOW (ref 22–32)
Calcium: 9.3 mg/dL (ref 8.9–10.3)
Chloride: 106 mmol/L (ref 98–111)
Creatinine, Ser: 0.9 mg/dL (ref 0.61–1.24)
GFR, Estimated: >60 mL/min (ref >60)
Glucose, Bld: 121 mg/dL — ABNORMAL HIGH (ref 70–99)
Potassium: 3.5 mmol/L (ref 3.5–5.1)
Sodium: 139 mmol/L (ref 135–145)
Total Bilirubin: 0.3 mg/dL (ref 0.0–1.2)
Total Protein: 8.4 g/dL — ABNORMAL HIGH (ref 6.5–8.1)

## 2023-12-07 NOTE — Discharge Instructions (Signed)
 Follow-up with gastroenterology.  The contact information for Dr. Jena Gauss has been provided in this discharge summary for you to call and make these arrangements.  In the meantime, begin taking Metamucil 1 heaping teaspoon in a glass of water 3 times daily along with Colace (equate stool softener) 100 mg twice daily.  This should soften your stool.

## 2023-12-07 NOTE — ED Provider Notes (Signed)
 [facility] Provider Note   CSN: [id number] Arrival date & time: 12/06/23  2255     History  Chief Complaint  Patient presents with   Rectal Bleeding    Joseph Blair is a 33 y.o. male.  Patient is a 33 year old male with history of GERD, hyperlipidemia.  Patient presenting today for evaluation of rectal bleeding.  He tells me he has been having intermittent rectal bleeding for the past 2 years.  He describes the passage of bright red blood with his stools.  He denies any rectal pain or swelling.  He denies any fevers or chills.  No abdominal pain.  He tells me in the past he was scheduled to see gastroenterology, but did not follow through.  The history is provided by the patient.       Home Medications Prior to Admission medications   Medication Sig Start Date End Date Taking? Authorizing Provider  atorvastatin (LIPITOR) 20 MG tablet Take 20 mg by mouth daily. 07/12/22   [provider]  benzonatate (TESSALON) 100 MG capsule Take 1 capsule (100 mg total) by mouth 3 (three) times daily as needed for cough. Do not take with alcohol or while driving or operating heavy machinery 06/25/22   Valentino Nose, NP  hydrochlorothiazide (HYDRODIURIL) 25 MG tablet Take 25 mg by mouth daily. 02/18/20   [provider]  lisinopril (ZESTRIL) 20 MG tablet Take 20 mg by mouth daily. 01/05/21   [provider]  naproxen (NAPROSYN) 500 MG tablet Take 1 tablet (500 mg total) by mouth 2 (two) times daily as needed. 12/26/22   Particia Nearing, PA-C  omeprazole (PRILOSEC) 20 MG capsule Take 20 mg by mouth daily. 09/12/22   [provider]  pantoprazole (PROTONIX) 40 MG tablet Take 1 tablet (40 mg total) by mouth daily. 07/28/22   Idol, Raynelle Fanning, PA-C  polyethylene glycol powder (GLYCOLAX/MIRALAX) 17 GM/SCOOP powder Take 17 g by mouth daily. 07/28/22   Burgess Amor, PA-C  tizanidine (ZANAFLEX) 2 MG capsule Take 1  capsule (2 mg total) by mouth 3 (three) times daily as needed for muscle spasms. Do not drink alcohol or drive while taking this medication.  May cause drowsiness. 12/26/22   Particia Nearing, PA-C      Allergies    Bee venom    Review of Systems   Review of Systems  All other systems reviewed and are negative.   Physical Exam Updated Vital Signs BP 139/84   Pulse 79   Temp 98.3 F (36.8 C) (Oral)   Resp 18   Ht 5\' 6"  (1.676 m)   Wt 96.6 kg   SpO2 95%   BMI 34.38 kg/m  Physical Exam Vitals and nursing note reviewed.  Constitutional:      General: He is not in acute distress.    Appearance: He is well-developed. He is not diaphoretic.  HENT:     Head: Normocephalic and atraumatic.  Cardiovascular:     Rate and Rhythm: Normal rate and regular rhythm.     Heart sounds: No murmur heard.    No friction rub.  Pulmonary:     Effort: Pulmonary effort is normal. No respiratory distress.     Breath sounds: Normal breath sounds. No wheezing or rales.  Abdominal:     General: Bowel sounds are normal. There is no distension.     Palpations: Abdomen is soft.     Tenderness: There is no abdominal tenderness.  Genitourinary:  Rectum: Normal.  Musculoskeletal:        General: Normal range of motion.     Cervical back: Normal range of motion and neck supple.  Skin:    General: Skin is warm and dry.  Neurological:     Mental Status: He is alert and oriented to person, place, and time.     Coordination: Coordination normal.     ED Results / Procedures / Treatments   Labs (all labs ordered are listed, but only abnormal results are displayed) Labs Reviewed  COMPREHENSIVE METABOLIC PANEL - Abnormal; Notable for the following components:      Result Value   CO2 21 (*)    Glucose, Bld 121 (*)    Total Protein 8.4 (*)    All other components within normal limits  CBC - Abnormal; Notable for the following components:   Hemoglobin 9.7 (*)    HCT 32.1 (*)    MCV 75.2 (*)     MCH 22.7 (*)    RDW 18.9 (*)    All other components within normal limits  POC OCCULT BLOOD, ED  TYPE AND SCREEN    EKG None  Radiology No results found.  Procedures Procedures    Medications Ordered in ED Medications - No data to display  ED Course/ Medical Decision Making/ A&P  Patient presenting with rectal bleeding as described in the HPI.  He arrives here with stable vital signs and is afebrile.  His abdomen is benign.  Rectal examination reveals no evidence for hemorrhoids or obvious fissure.  Laboratory studies obtained including CBC and CMP.  He has no white count but hemoglobin is 9.7.  Patient presenting with intermittent rectal bleeding over the past 2 years.  Nothing tonight appears emergent, but will require GI follow-up.  Patient will be referred to the on-call gastroenterologist.  Final Clinical Impression(s) / ED Diagnoses Final diagnoses:  None    Rx / DC Orders ED Discharge Orders     None         Geoffery Lyons, MD 12/07/23 0200

## 2023-12-09 NOTE — Progress Notes (Signed)
 The patient has reviewed information regarding the risks and complications and understands the advantages and disadvantages of elective sterilization. With full informed written and oral consent, he has elected to proceed with vasectomy. The patient was placed in the supine position, and the genitalia was sterilely prepped with betadine and draped; 1% lidocaine was used for local anesthesia of the skin and perivasal tissue in the midline. The right vas was then grasped through the skin using the non-piercing vas clamp, the skin was pierced with a single blade of the sharpened hemostat. The sharpened hemostat was then placed through this incision, and the perivasal tissue cleared from around the vas itself. The vas was then delivered through the wound and clamped with a vas clamp, a short segment of vasal tissue was cleared, and a 2-0 chromic suture was then passed beneath the vas. The chromic suture was then tied proximally, and a second one was tied distally, isolating the approximately 1 cm segment of vas. I then excised the segment of vas and cauterized the proximal and distal lumens with the eye cautery. The excess suture was then cut, and the ligated ends were covered with dartos fascia and buried, and the vas was allowed to drop back into the scrotum after observation for any bleeding. The contralateral vas was then treated in an identical fashion. Any further bleeding points were cauterized. The wound was then observed for any bleeding, the skin edges compressed, and none was noted. The patient tolerated the procedure well and without complications.   A sterile gauze dressing was applied as well as a form of scrotal supporting clothing. He was sent home with instructions regarding wound care and signs of infection, and recommended to restrict activity for 48 hours. .  He was instructed to use ice for the next 12 hours.  He was also instructed to call immediately for excessive bleeding, swelling, drainage,  discomfort, fever or chills. He was informed he should not consider himself sterile until a  semen analysis at 12 weeks has  been noted to be completely free of sperm.

## 2023-12-10 ENCOUNTER — Ambulatory Visit: Payer: BLUE CROSS/BLUE SHIELD | Admitting: Urology

## 2023-12-10 VITALS — BP 123/81 | HR 86

## 2023-12-10 DIAGNOSIS — Z3009 Encounter for other general counseling and advice on contraception: Secondary | ICD-10-CM

## 2023-12-10 NOTE — Patient Instructions (Addendum)
 Vasectomy Postoperative Instructions  Please bring back a semen analysis in approximately 3 months.  These are scheduled by appointment only.  Appointment Date: Mar 10, 2024 Appointment Time: 3:00PM Location: [address] [place] [phone]    You will be given a sterile specimen cup. Please label the cup with your name, date of birth, date and time of collections.  What to Expect  - slight redness, swelling and scant drainage along the incision  - mild to moderate discomfort  - black and blue (bruising) as the tissue heals  - low grade fever  - scrotal sensitivity and/or tenderness - Edges of the incision may pull apart and heal slowly, sometimes a knot may be present which remains for several months.  This is NORMAL and all part of the healing process. - if stitches are placed, they do not need to be removed - if you have pain or discomfort immediately after the vasectomy, you may use OTC pain medication for relief , ex: tylenol.  After local anesthetic wears off an ice pack will provide additional comfort and can also prevent swelling if used  Activity  - no sexual intercourse for at lease 5 days depending on comfort  - no heavy lifting for 48-72 hours (anything over 5-10 lbs)  Wound Care  - shower only after 24 hours  - no tub baths, hot tub, or pools for at least 7 days  - ice packs for 48 hours: 30 minutes on and 30 minutes off  Problem to Report  - generalized redness  - increased pain and swelling  - fever greater than 101 F  - significant drainage or bleeding from the wound  TO DO - Ejaculations help to clear the passage of sperm, but you must use another from of birth control until you are told you may discontinue its use!! - You will be given a specimen cup to bring back a semen sample in 3 months to check and see if its clear of sperm.  Only after the semen is sent for analysis and is reported back as clear should you use this as your primary form of  birth control!

## 2024-01-21 NOTE — Progress Notes (Deleted)
 CARDIOLOGY CONSULT NOTE       Patient ID: Joseph Blair MRN: [id number] DOB/AGE: [birth year] 33 y.o.   Referring Physician: Romeo Apple Primary Physician: Benetta Spar, MD Primary Cardiologist: Eden Emms Reason for Consultation: History myopericarditis    HPI:  33 y.o. last seen in 2022 History of HTN, GERD, ADHD  Seen in hospital 03/05/21 with ? Recurrence SSCP radiating to arms not worse with exertion He had troponin only 30-> 42 no spike/trend Mildly  Elevated LfTls with negative hepatitis panel and negative RUQ [place]  CRP elevated 5.6 and ESR only 23  He has been COVID negative on testing multiple times He smokes and drinks regularly and uses marijuana Rx iv pain meds then colchicine and high dose ibuprofen   TTE done 03/05/21 sohwed EF 60-65% no effusion no valve disease ECG 03/06/21 did not show pericarditis SR rate 107 normal no J point elevation or PR depression   He has had multiple left knee procedures for meniscal tears and torn ACL Xray 03/27/21 Showed screw hardware out of alignment and needs revision   Has not had any cardiac issues since d/c May. No chest pain , palpitations, syncope or dyspnea He raises exotic dogs. Has cut back his ETOH (cognac) quite a bit and only smoking 2 black and milds/day  Seen in ED 12/06/23 with rectal bleeding ? From constipation. Has been intermittent over last 2 years Hct 32.1 and Hb 9.7   ***   ROS All other systems reviewed and negative except as noted above  Past Medical History:  Diagnosis Date   ADHD (attention deficit hyperactivity disorder)    GERD (gastroesophageal reflux disease)    Hypertension    Pancreas disorder 2013   patient reports he was told he might have pancreatitis. No elevated lipase or imaging to support per EPIC review   Pancreatitis     Family History  Problem Relation Age of Onset   ADD / ADHD Brother    ADD / ADHD Maternal Aunt    Drug abuse Maternal Uncle    Alcohol abuse Maternal Uncle     Schizophrenia Maternal Uncle    Colon cancer Paternal Aunt        76 years old   Anxiety disorder Neg Hx    Bipolar disorder Neg Hx    Dementia Neg Hx    Depression Neg Hx    OCD Neg Hx    Paranoid behavior Neg Hx    Seizures Neg Hx    Sexual abuse Neg Hx    Physical abuse Neg Hx    Liver disease Neg Hx     Social History   Socioeconomic History   Marital status: Married    Spouse name: Not on file   Number of children: Not on file   Years of education: Not on file   Highest education level: Not on file  Occupational History   Not on file  Tobacco Use   Smoking status: Some Days    Types: Cigars   Smokeless tobacco: Never  Vaping Use   Vaping status: Never Used  Substance and Sexual Activity   Alcohol use: Not Currently    Comment: denies use 10/14   Drug use: Not Currently    Types: Marijuana    Comment: denies use 10/14   Sexual activity: Yes  Other Topics Concern   Not on file  Social History Narrative   Not on file   Social Drivers of Health   Financial Resource Strain: Not on  file  Food Insecurity: Not on file  Transportation Needs: Not on file  Physical Activity: Not on file  Stress: Not on file  Social Connections: Not on file  Intimate Partner Violence: Not on file    Past Surgical History:  Procedure Laterality Date   ANTERIOR CRUCIATE LIGAMENT REPAIR     Left knee Dr. Edger House   ANTERIOR CRUCIATE LIGAMENT REPAIR Right 06/02/2020   Procedure: RIGHT ANTERIOR CRUCIATE LIGAMENT (ACL) REPAIR WITH ALLOGRAFT;  Surgeon: Vickki Hearing, MD;  Location: [facility];  Service: Orthopedics;  Laterality: Right;   HERNIA REPAIR     KNEE ARTHROSCOPY WITH MEDIAL MENISECTOMY Right 06/02/2020   Procedure: RIGHT KNEE ARTHROSCOPY WITH MEDIAL MENISECTOMY;  Surgeon: Vickki Hearing, MD;  Location: [facility];  Service: Orthopedics;  Laterality: Right;      Current Outpatient Medications:    atorvastatin (LIPITOR) 20 MG tablet, Take 20 mg by mouth daily., Disp: , Rfl:     benzonatate (TESSALON) 100 MG capsule, Take 1 capsule (100 mg total) by mouth 3 (three) times daily as needed for cough. Do not take with alcohol or while driving or operating heavy machinery, Disp: 21 capsule, Rfl: 0   hydrochlorothiazide (HYDRODIURIL) 25 MG tablet, Take 25 mg by mouth daily., Disp: , Rfl:    lisinopril (ZESTRIL) 20 MG tablet, Take 20 mg by mouth daily., Disp: , Rfl:    naproxen (NAPROSYN) 500 MG tablet, Take 1 tablet (500 mg total) by mouth 2 (two) times daily as needed., Disp: 20 tablet, Rfl: 0   omeprazole (PRILOSEC) 20 MG capsule, Take 20 mg by mouth daily., Disp: , Rfl:    pantoprazole (PROTONIX) 40 MG tablet, Take 1 tablet (40 mg total) by mouth daily., Disp: 30 tablet, Rfl: 0   polyethylene glycol powder (GLYCOLAX/MIRALAX) 17 GM/SCOOP powder, Take 17 g by mouth daily., Disp: 255 g, Rfl: 0   tizanidine (ZANAFLEX) 2 MG capsule, Take 1 capsule (2 mg total) by mouth 3 (three) times daily as needed for muscle spasms. Do not drink alcohol or drive while taking this medication.  May cause drowsiness., Disp: 15 capsule, Rfl: 0    Physical Exam: There were no vitals taken for this visit.    Affect appropriate Healthy:  appears stated age HEENT: normal Neck supple with no adenopathy JVP normal no bruits no thyromegaly Lungs clear with no wheezing and good diaphragmatic motion Heart:  S1/S2 2/6 SEM murmur, no rub, gallop or click PMI normal Abdomen: benighn, BS positve, no tenderness, no AAA no bruit.  No HSM or HJR Distal pulses intact with no bruits No edema Neuro non-focal Skin warm and dry Previous bilateral ACL repairs left knee laxity    Labs:   Lab Results  Component Value Date   WBC 9.2 12/06/2023   HGB 9.7 (L) 12/06/2023   HCT 32.1 (L) 12/06/2023   MCV 75.2 (L) 12/06/2023   PLT 259 12/06/2023   No results for input(s): "NA", "K", "CL", "CO2", "BUN", "CREATININE", "CALCIUM", "PROT", "BILITOT", "ALKPHOS", "ALT", "AST", "GLUCOSE" in the last 168  hours.  Invalid input(s): "LABALBU" Lab Results  Component Value Date   CKTOTAL 358 04/05/2016   TROPONINI <0.03 07/18/2018   No results found for: "CHOL" No results found for: "HDL" No results found for: "LDLCALC" No results found for: "TRIG" No results found for: "CHOLHDL" No results found for: "LDLDIRECT"    Radiology: No results found.  EKG: See HPI  12/26/22 SR rate 94 normal    ASSESSMENT AND PLAN:  Chest Pain/Pericarditis:  diagnosis not 100% clear. Echo and ECG did not support Minimal bump in troponin with no trend Minimally elevated ESR. Pain did respond to NSAI's and colchicine Currently asymptomatic active and stable  HTN:  continue Zestril and diuretic  GERD:  on prilosec f/u with primary  Anemia: with rectal bleeding needs GI f/u  HLD on lipitor labs with primary discussed calcium score   Calcium Score  F/U in a year   Signed: Charlton Haws 01/21/2024, 5:48 PM

## 2024-02-03 ENCOUNTER — Encounter: Payer: Self-pay | Admitting: Cardiovascular Disease

## 2024-02-03 ENCOUNTER — Ambulatory Visit: Payer: BC Managed Care – PPO | Attending: Cardiovascular Disease | Admitting: Cardiovascular Disease

## 2024-02-04 ENCOUNTER — Telehealth: Payer: Self-pay | Admitting: Cardiovascular Disease

## 2024-02-04 NOTE — Telephone Encounter (Signed)
 Deleting recall- unable to contact pt to sch after multiple attempts

## 2024-03-10 ENCOUNTER — Encounter: Payer: Self-pay | Admitting: Urology

## 2024-03-10 ENCOUNTER — Other Ambulatory Visit: Payer: BLUE CROSS/BLUE SHIELD
